# Patient Record
Sex: Female | Born: 1945 | Race: White | Hispanic: No | Smoking: Current every day smoker
Health system: Southern US, Community
[De-identification: ages and names within clinical notes are randomized; demographics above are authoritative.]

## PROBLEM LIST (undated history)

## (undated) ENCOUNTER — Emergency Department: Payer: Medicare Other

## (undated) DIAGNOSIS — C801 Malignant (primary) neoplasm, unspecified: Secondary | ICD-10-CM

## (undated) DIAGNOSIS — G14 Postpolio syndrome: Secondary | ICD-10-CM

## (undated) DIAGNOSIS — K219 Gastro-esophageal reflux disease without esophagitis: Secondary | ICD-10-CM

## (undated) DIAGNOSIS — J449 Chronic obstructive pulmonary disease, unspecified: Secondary | ICD-10-CM

## (undated) DIAGNOSIS — B91 Sequelae of poliomyelitis: Secondary | ICD-10-CM

## (undated) DIAGNOSIS — I10 Essential (primary) hypertension: Secondary | ICD-10-CM

## (undated) DIAGNOSIS — F32A Depression, unspecified: Secondary | ICD-10-CM

## (undated) DIAGNOSIS — F329 Major depressive disorder, single episode, unspecified: Secondary | ICD-10-CM

## (undated) DIAGNOSIS — E119 Type 2 diabetes mellitus without complications: Secondary | ICD-10-CM

## (undated) DIAGNOSIS — J439 Emphysema, unspecified: Secondary | ICD-10-CM

## (undated) DIAGNOSIS — M5030 Other cervical disc degeneration, unspecified cervical region: Secondary | ICD-10-CM

## (undated) DIAGNOSIS — A809 Acute poliomyelitis, unspecified: Secondary | ICD-10-CM

## (undated) HISTORY — PX: BLADDER SURGERY: SHX569

## (undated) HISTORY — DX: Gastro-esophageal reflux disease without esophagitis: K21.9

## (undated) HISTORY — DX: Type 2 diabetes mellitus without complications: E11.9

## (undated) HISTORY — DX: Chronic obstructive pulmonary disease, unspecified: J44.9

## (undated) HISTORY — PX: ABDOMINAL HYSTERECTOMY: SHX81

## (undated) HISTORY — DX: Other cervical disc degeneration, unspecified cervical region: M50.30

## (undated) HISTORY — DX: Depression, unspecified: F32.A

## (undated) HISTORY — DX: Postpolio syndrome: G14

## (undated) HISTORY — PX: ESOPHAGEAL DILATION: SHX303

## (undated) HISTORY — PX: BREAST LUMPECTOMY: SHX2

## (undated) HISTORY — DX: Acute poliomyelitis, unspecified: A80.9

## (undated) HISTORY — DX: Essential (primary) hypertension: I10

## (undated) HISTORY — DX: Major depressive disorder, single episode, unspecified: F32.9

## (undated) HISTORY — PX: CHOLECYSTECTOMY: SHX55

---

## 2002-06-17 HISTORY — PX: CARPAL TUNNEL RELEASE: SHX101

## 2005-09-24 ENCOUNTER — Ambulatory Visit: Payer: Self-pay | Admitting: Unknown Physician Specialty

## 2007-10-09 ENCOUNTER — Ambulatory Visit: Payer: Self-pay | Admitting: Surgery

## 2008-01-06 ENCOUNTER — Ambulatory Visit: Payer: Self-pay | Admitting: Unknown Physician Specialty

## 2008-01-22 ENCOUNTER — Ambulatory Visit: Payer: Self-pay | Admitting: Unknown Physician Specialty

## 2008-02-03 ENCOUNTER — Other Ambulatory Visit: Payer: Self-pay

## 2008-02-03 ENCOUNTER — Ambulatory Visit: Payer: Self-pay | Admitting: Surgery

## 2008-02-09 ENCOUNTER — Ambulatory Visit: Payer: Self-pay | Admitting: Surgery

## 2008-09-15 HISTORY — PX: RECTAL PROLAPSE REPAIR: SHX759

## 2008-10-06 ENCOUNTER — Ambulatory Visit: Payer: Self-pay | Admitting: Surgery

## 2008-10-13 ENCOUNTER — Inpatient Hospital Stay: Payer: Self-pay | Admitting: Surgery

## 2009-07-10 ENCOUNTER — Ambulatory Visit: Payer: Self-pay | Admitting: Family Medicine

## 2009-09-28 ENCOUNTER — Ambulatory Visit: Payer: Self-pay | Admitting: Otolaryngology

## 2010-04-10 ENCOUNTER — Ambulatory Visit: Payer: Self-pay | Admitting: Surgery

## 2010-05-02 ENCOUNTER — Ambulatory Visit: Payer: Self-pay | Admitting: Unknown Physician Specialty

## 2010-05-09 ENCOUNTER — Ambulatory Visit: Payer: Self-pay | Admitting: Surgery

## 2010-05-16 ENCOUNTER — Ambulatory Visit: Payer: Self-pay | Admitting: Surgery

## 2010-05-29 LAB — PATHOLOGY REPORT

## 2010-06-05 ENCOUNTER — Ambulatory Visit: Payer: Self-pay | Admitting: Internal Medicine

## 2010-06-12 ENCOUNTER — Ambulatory Visit: Payer: Self-pay | Admitting: Internal Medicine

## 2010-06-13 ENCOUNTER — Ambulatory Visit: Payer: Self-pay | Admitting: Surgery

## 2010-06-17 ENCOUNTER — Ambulatory Visit: Payer: Self-pay | Admitting: Internal Medicine

## 2010-07-18 ENCOUNTER — Ambulatory Visit: Payer: Self-pay | Admitting: Internal Medicine

## 2010-08-16 ENCOUNTER — Ambulatory Visit: Payer: Self-pay | Admitting: Internal Medicine

## 2010-09-16 ENCOUNTER — Ambulatory Visit: Payer: Self-pay | Admitting: Internal Medicine

## 2010-10-02 ENCOUNTER — Ambulatory Visit: Payer: Self-pay | Admitting: Internal Medicine

## 2010-10-16 ENCOUNTER — Ambulatory Visit: Payer: Self-pay | Admitting: Internal Medicine

## 2010-11-16 ENCOUNTER — Ambulatory Visit: Payer: Self-pay | Admitting: Internal Medicine

## 2010-12-16 ENCOUNTER — Ambulatory Visit: Payer: Self-pay | Admitting: Internal Medicine

## 2011-01-16 ENCOUNTER — Ambulatory Visit: Payer: Self-pay | Admitting: Internal Medicine

## 2011-02-22 ENCOUNTER — Ambulatory Visit: Payer: Self-pay | Admitting: Internal Medicine

## 2011-03-18 ENCOUNTER — Ambulatory Visit: Payer: Self-pay | Admitting: Internal Medicine

## 2011-05-06 ENCOUNTER — Ambulatory Visit: Payer: Self-pay | Admitting: Internal Medicine

## 2011-05-18 ENCOUNTER — Ambulatory Visit: Payer: Self-pay | Admitting: Internal Medicine

## 2011-06-18 ENCOUNTER — Ambulatory Visit: Payer: Self-pay | Admitting: Internal Medicine

## 2011-07-08 LAB — CANCER CENTER HEMATOCRIT: HCT: 46.2 % (ref 35.0–47.0)

## 2011-07-19 ENCOUNTER — Ambulatory Visit: Payer: Self-pay | Admitting: Internal Medicine

## 2011-07-29 LAB — CANCER CENTER HEMATOCRIT: HCT: 47 % (ref 35.0–47.0)

## 2011-08-16 ENCOUNTER — Ambulatory Visit: Payer: Self-pay | Admitting: Internal Medicine

## 2011-08-19 ENCOUNTER — Ambulatory Visit: Payer: Self-pay | Admitting: Internal Medicine

## 2011-08-19 LAB — CBC CANCER CENTER
Basophil #: 0 x10 3/mm (ref 0.0–0.1)
Basophil %: 0.3 %
Eosinophil #: 0.1 x10 3/mm (ref 0.0–0.7)
HCT: 44.9 % (ref 35.0–47.0)
HGB: 15.4 g/dL (ref 12.0–16.0)
Lymphocyte %: 20.9 %
MCH: 32.9 pg (ref 26.0–34.0)
Monocyte #: 0.4 x10 3/mm (ref 0.0–0.7)
Monocyte %: 5.9 %
Neutrophil %: 71.1 %
Platelet: 262 x10 3/mm (ref 150–440)
RBC: 4.68 10*6/uL (ref 3.80–5.20)
RDW: 13.8 % (ref 11.5–14.5)
WBC: 6.1 x10 3/mm (ref 3.6–11.0)

## 2011-08-19 LAB — CREATININE, SERUM
EGFR (African American): >60
EGFR (Non-African Amer.): >60

## 2011-08-19 LAB — LACTATE DEHYDROGENASE: LDH: 210 U/L (ref 84–246)

## 2011-08-19 LAB — HEPATIC FUNCTION PANEL A (ARMC)
Albumin: 3.8 g/dL (ref 3.4–5.0)
Bilirubin,Total: 0.4 mg/dL (ref 0.2–1.0)
SGOT(AST): 93 U/L — ABNORMAL HIGH (ref 15–37)
SGPT (ALT): 189 U/L — ABNORMAL HIGH
Total Protein: 7.9 g/dL (ref 6.4–8.2)

## 2011-09-16 ENCOUNTER — Ambulatory Visit: Payer: Self-pay | Admitting: Internal Medicine

## 2011-10-16 ENCOUNTER — Ambulatory Visit: Payer: Self-pay | Admitting: Internal Medicine

## 2011-11-05 ENCOUNTER — Ambulatory Visit: Payer: Self-pay | Admitting: Unknown Physician Specialty

## 2011-11-06 LAB — PATHOLOGY REPORT

## 2011-11-16 ENCOUNTER — Ambulatory Visit: Payer: Self-pay | Admitting: Internal Medicine

## 2011-12-04 ENCOUNTER — Emergency Department: Payer: Self-pay | Admitting: Emergency Medicine

## 2011-12-04 LAB — COMPREHENSIVE METABOLIC PANEL
Albumin: 3.5 g/dL (ref 3.4–5.0)
Alkaline Phosphatase: 124 U/L (ref 50–136)
Anion Gap: 7 (ref 7–16)
Bilirubin,Total: 0.3 mg/dL (ref 0.2–1.0)
Calcium, Total: 8.8 mg/dL (ref 8.5–10.1)
Co2: 29 mmol/L (ref 21–32)
Creatinine: 0.44 mg/dL — ABNORMAL LOW (ref 0.60–1.30)
EGFR (African American): >60
Potassium: 3.6 mmol/L (ref 3.5–5.1)
SGOT(AST): 49 U/L — ABNORMAL HIGH (ref 15–37)
SGPT (ALT): 81 U/L — ABNORMAL HIGH

## 2011-12-04 LAB — CBC
HCT: 43.2 % (ref 35.0–47.0)
HGB: 14.1 g/dL (ref 12.0–16.0)
MCH: 30.3 pg (ref 26.0–34.0)
MCHC: 32.6 g/dL (ref 32.0–36.0)
MCV: 93 fL (ref 80–100)
RBC: 4.66 10*6/uL (ref 3.80–5.20)
WBC: 6.3 10*3/uL (ref 3.6–11.0)

## 2011-12-11 LAB — CANCER CENTER HEMATOCRIT: HCT: 43 % (ref 35.0–47.0)

## 2011-12-12 ENCOUNTER — Ambulatory Visit: Payer: Self-pay | Admitting: Otolaryngology

## 2011-12-12 LAB — CREATININE, SERUM
EGFR (African American): >60
EGFR (Non-African Amer.): >60

## 2011-12-16 ENCOUNTER — Ambulatory Visit: Payer: Self-pay | Admitting: Internal Medicine

## 2012-02-05 ENCOUNTER — Ambulatory Visit: Payer: Self-pay | Admitting: Internal Medicine

## 2012-02-05 LAB — CBC CANCER CENTER
Basophil #: 0 x10 3/mm (ref 0.0–0.1)
Basophil %: 0.6 %
Eosinophil #: 0.1 x10 3/mm (ref 0.0–0.7)
HCT: 41.7 % (ref 35.0–47.0)
Lymphocyte #: 1.7 x10 3/mm (ref 1.0–3.6)
MCHC: 33 g/dL (ref 32.0–36.0)
MCV: 92 fL (ref 80–100)
Monocyte %: 7.2 %
Neutrophil #: 4.9 x10 3/mm (ref 1.4–6.5)
RBC: 4.55 10*6/uL (ref 3.80–5.20)
RDW: 15.1 % — ABNORMAL HIGH (ref 11.5–14.5)
WBC: 7.3 x10 3/mm (ref 3.6–11.0)

## 2012-02-05 LAB — CREATININE, SERUM
Creatinine: 0.71 mg/dL (ref 0.60–1.30)
EGFR (African American): >60
EGFR (Non-African Amer.): >60

## 2012-02-05 LAB — HEPATIC FUNCTION PANEL A (ARMC)
Albumin: 3.5 g/dL (ref 3.4–5.0)
Alkaline Phosphatase: 128 U/L (ref 50–136)
Bilirubin,Total: 0.4 mg/dL (ref 0.2–1.0)
SGOT(AST): 47 U/L — ABNORMAL HIGH (ref 15–37)
SGPT (ALT): 86 U/L — ABNORMAL HIGH (ref 12–78)

## 2012-02-16 ENCOUNTER — Ambulatory Visit: Payer: Self-pay | Admitting: Internal Medicine

## 2012-04-15 ENCOUNTER — Ambulatory Visit: Payer: Self-pay | Admitting: Internal Medicine

## 2012-04-15 LAB — CANCER CENTER HEMATOCRIT: HCT: 43.7 % (ref 35.0–47.0)

## 2012-04-17 ENCOUNTER — Ambulatory Visit: Payer: Self-pay | Admitting: Internal Medicine

## 2012-06-24 ENCOUNTER — Ambulatory Visit: Payer: Self-pay | Admitting: Internal Medicine

## 2012-06-24 LAB — CREATININE, SERUM
Creatinine: 0.66 mg/dL (ref 0.60–1.30)
EGFR (Non-African Amer.): >60

## 2012-06-24 LAB — CBC CANCER CENTER
Basophil #: 0.1 x10 3/mm (ref 0.0–0.1)
Basophil %: 1.2 %
Eosinophil #: 0.1 x10 3/mm (ref 0.0–0.7)
HCT: 42.8 % (ref 35.0–47.0)
Lymphocyte %: 24.3 %
MCHC: 34.1 g/dL (ref 32.0–36.0)
Monocyte #: 0.6 x10 3/mm (ref 0.2–0.9)
Neutrophil #: 5.2 x10 3/mm (ref 1.4–6.5)
RDW: 14.6 % — ABNORMAL HIGH (ref 11.5–14.5)

## 2012-06-24 LAB — HEPATIC FUNCTION PANEL A (ARMC)
Alkaline Phosphatase: 126 U/L (ref 50–136)
Bilirubin,Total: 0.4 mg/dL (ref 0.2–1.0)
SGOT(AST): 47 U/L — ABNORMAL HIGH (ref 15–37)
SGPT (ALT): 81 U/L — ABNORMAL HIGH (ref 12–78)

## 2012-06-24 LAB — LACTATE DEHYDROGENASE: LDH: 183 U/L (ref 81–246)

## 2012-07-18 ENCOUNTER — Ambulatory Visit: Payer: Self-pay | Admitting: Internal Medicine

## 2012-11-11 ENCOUNTER — Ambulatory Visit: Payer: Self-pay | Admitting: Internal Medicine

## 2012-11-11 LAB — CBC CANCER CENTER
Basophil #: 0.1 x10 3/mm (ref 0.0–0.1)
Basophil %: 0.7 %
Eosinophil #: 0.1 x10 3/mm (ref 0.0–0.7)
HCT: 42.7 % (ref 35.0–47.0)
HGB: 14.2 g/dL (ref 12.0–16.0)
Lymphocyte %: 25.2 %
MCHC: 33.3 g/dL (ref 32.0–36.0)
MCV: 87 fL (ref 80–100)
Neutrophil #: 4.8 x10 3/mm (ref 1.4–6.5)
Platelet: 250 x10 3/mm (ref 150–440)
RBC: 4.92 10*6/uL (ref 3.80–5.20)
RDW: 15.5 % — ABNORMAL HIGH (ref 11.5–14.5)
WBC: 7.2 x10 3/mm (ref 3.6–11.0)

## 2012-11-11 LAB — LACTATE DEHYDROGENASE: LDH: 165 U/L (ref 81–246)

## 2012-11-11 LAB — CREATININE, SERUM: EGFR (Non-African Amer.): >60

## 2012-11-11 LAB — HEPATIC FUNCTION PANEL A (ARMC)
Albumin: 3.8 g/dL (ref 3.4–5.0)
Bilirubin, Direct: 0.1 mg/dL (ref 0.00–0.20)
Bilirubin,Total: 0.4 mg/dL (ref 0.2–1.0)
SGOT(AST): 27 U/L (ref 15–37)

## 2012-11-15 ENCOUNTER — Ambulatory Visit: Payer: Self-pay | Admitting: Internal Medicine

## 2012-12-30 ENCOUNTER — Ambulatory Visit: Payer: Self-pay | Admitting: Internal Medicine

## 2013-01-15 ENCOUNTER — Ambulatory Visit: Payer: Self-pay | Admitting: Internal Medicine

## 2013-02-15 ENCOUNTER — Ambulatory Visit: Payer: Self-pay | Admitting: Internal Medicine

## 2013-06-30 ENCOUNTER — Ambulatory Visit: Payer: Self-pay | Admitting: Internal Medicine

## 2013-07-02 ENCOUNTER — Ambulatory Visit: Payer: Self-pay | Admitting: Internal Medicine

## 2013-07-02 LAB — CBC CANCER CENTER
BASOS PCT: 0.8 %
Basophil #: 0.1 x10 3/mm (ref 0.0–0.1)
EOS PCT: 2.6 %
Eosinophil #: 0.2 x10 3/mm (ref 0.0–0.7)
HCT: 42.6 % (ref 35.0–47.0)
HGB: 13.9 g/dL (ref 12.0–16.0)
LYMPHS ABS: 1.5 x10 3/mm (ref 1.0–3.6)
LYMPHS PCT: 23.3 %
MCH: 29 pg (ref 26.0–34.0)
MCHC: 32.6 g/dL (ref 32.0–36.0)
MCV: 89 fL (ref 80–100)
MONO ABS: 0.5 x10 3/mm (ref 0.2–0.9)
Monocyte %: 7.9 %
Neutrophil #: 4.1 x10 3/mm (ref 1.4–6.5)
Neutrophil %: 65.4 %
PLATELETS: 220 x10 3/mm (ref 150–440)
RBC: 4.78 10*6/uL (ref 3.80–5.20)
RDW: 15.8 % — AB (ref 11.5–14.5)
WBC: 6.3 x10 3/mm (ref 3.6–11.0)

## 2013-07-02 LAB — HEPATIC FUNCTION PANEL A (ARMC)
ALBUMIN: 3.5 g/dL (ref 3.4–5.0)
Alkaline Phosphatase: 95 U/L
Bilirubin, Direct: 0.1 mg/dL (ref 0.00–0.20)
Bilirubin,Total: 0.3 mg/dL (ref 0.2–1.0)
SGOT(AST): 21 U/L (ref 15–37)
SGPT (ALT): 33 U/L (ref 12–78)
Total Protein: 7.2 g/dL (ref 6.4–8.2)

## 2013-07-02 LAB — CREATININE, SERUM
Creatinine: 0.65 mg/dL (ref 0.60–1.30)
EGFR (African American): >60

## 2013-07-02 LAB — LACTATE DEHYDROGENASE: LDH: 161 U/L (ref 81–246)

## 2013-07-18 ENCOUNTER — Ambulatory Visit: Payer: Self-pay | Admitting: Internal Medicine

## 2013-12-06 DIAGNOSIS — G14 Postpolio syndrome: Secondary | ICD-10-CM | POA: Insufficient documentation

## 2013-12-06 DIAGNOSIS — Z8572 Personal history of non-Hodgkin lymphomas: Secondary | ICD-10-CM | POA: Insufficient documentation

## 2013-12-06 DIAGNOSIS — Z8579 Personal history of other malignant neoplasms of lymphoid, hematopoietic and related tissues: Secondary | ICD-10-CM | POA: Insufficient documentation

## 2013-12-06 DIAGNOSIS — E7800 Pure hypercholesterolemia, unspecified: Secondary | ICD-10-CM | POA: Insufficient documentation

## 2013-12-06 DIAGNOSIS — E119 Type 2 diabetes mellitus without complications: Secondary | ICD-10-CM | POA: Insufficient documentation

## 2013-12-06 DIAGNOSIS — K219 Gastro-esophageal reflux disease without esophagitis: Secondary | ICD-10-CM | POA: Insufficient documentation

## 2013-12-06 DIAGNOSIS — J449 Chronic obstructive pulmonary disease, unspecified: Secondary | ICD-10-CM | POA: Insufficient documentation

## 2013-12-06 DIAGNOSIS — I10 Essential (primary) hypertension: Secondary | ICD-10-CM | POA: Insufficient documentation

## 2013-12-10 ENCOUNTER — Ambulatory Visit: Payer: Self-pay | Admitting: Internal Medicine

## 2013-12-10 LAB — HEMATOCRIT: HCT: 43.5 % (ref 35.0–47.0)

## 2013-12-15 ENCOUNTER — Ambulatory Visit: Payer: Self-pay | Admitting: Internal Medicine

## 2014-01-15 ENCOUNTER — Ambulatory Visit: Payer: Self-pay | Admitting: Internal Medicine

## 2014-01-23 ENCOUNTER — Inpatient Hospital Stay: Payer: Self-pay | Admitting: Internal Medicine

## 2014-01-23 LAB — DRUG SCREEN, URINE
Amphetamines, Ur Screen: NEGATIVE (ref ?–1000)
Barbiturates, Ur Screen: NEGATIVE (ref ?–200)
Benzodiazepine, Ur Scrn: POSITIVE (ref ?–200)
CANNABINOID 50 NG, UR ~~LOC~~: NEGATIVE (ref ?–50)
Cocaine Metabolite,Ur ~~LOC~~: NEGATIVE (ref ?–300)
MDMA (ECSTASY) UR SCREEN: NEGATIVE (ref ?–500)
Methadone, Ur Screen: NEGATIVE (ref ?–300)
OPIATE, UR SCREEN: NEGATIVE (ref ?–300)
PHENCYCLIDINE (PCP) UR S: NEGATIVE (ref ?–25)
Tricyclic, Ur Screen: NEGATIVE (ref ?–1000)

## 2014-01-23 LAB — URINALYSIS, COMPLETE
BILIRUBIN, UR: NEGATIVE
Bacteria: NONE SEEN
Blood: NEGATIVE
Glucose,UR: NEGATIVE mg/dL (ref 0–75)
Leukocyte Esterase: NEGATIVE
NITRITE: NEGATIVE
Ph: 5 (ref 4.5–8.0)
Protein: 100
RBC,UR: 2 /HPF (ref 0–5)
Specific Gravity: 1.019 (ref 1.003–1.030)
Squamous Epithelial: NONE SEEN
WBC UR: 3 /HPF (ref 0–5)

## 2014-01-23 LAB — ACETAMINOPHEN LEVEL

## 2014-01-23 LAB — COMPREHENSIVE METABOLIC PANEL
ALBUMIN: 3.9 g/dL (ref 3.4–5.0)
ANION GAP: 14 (ref 7–16)
AST: 47 U/L — AB (ref 15–37)
Alkaline Phosphatase: 95 U/L
BUN: 11 mg/dL (ref 7–18)
Bilirubin,Total: 0.5 mg/dL (ref 0.2–1.0)
CALCIUM: 8.9 mg/dL (ref 8.5–10.1)
CREATININE: 0.91 mg/dL (ref 0.60–1.30)
Chloride: 104 mmol/L (ref 98–107)
Co2: 21 mmol/L (ref 21–32)
EGFR (African American): >60
EGFR (Non-African Amer.): >60
GLUCOSE: 188 mg/dL — AB (ref 65–99)
Osmolality: 282 (ref 275–301)
POTASSIUM: 3.7 mmol/L (ref 3.5–5.1)
SGPT (ALT): 50 U/L
SODIUM: 139 mmol/L (ref 136–145)
Total Protein: 8 g/dL (ref 6.4–8.2)

## 2014-01-23 LAB — CBC
HCT: 47 % (ref 35.0–47.0)
HGB: 15.4 g/dL (ref 12.0–16.0)
MCH: 30.9 pg (ref 26.0–34.0)
MCHC: 32.8 g/dL (ref 32.0–36.0)
MCV: 94 fL (ref 80–100)
PLATELETS: 263 10*3/uL (ref 150–440)
RBC: 4.99 10*6/uL (ref 3.80–5.20)
RDW: 15.8 % — ABNORMAL HIGH (ref 11.5–14.5)
WBC: 7.8 10*3/uL (ref 3.6–11.0)

## 2014-01-23 LAB — ETHANOL: Ethanol: <3 mg/dL

## 2014-01-23 LAB — SALICYLATE LEVEL: SALICYLATES, SERUM: 4.5 mg/dL — AB

## 2014-01-23 LAB — LIPASE, BLOOD: Lipase: 76 U/L (ref 73–393)

## 2014-01-24 ENCOUNTER — Ambulatory Visit: Payer: Self-pay | Admitting: Neurology

## 2014-01-24 LAB — BASIC METABOLIC PANEL
Anion Gap: 8 (ref 7–16)
BUN: 13 mg/dL (ref 7–18)
CREATININE: 0.64 mg/dL (ref 0.60–1.30)
Calcium, Total: 8.3 mg/dL — ABNORMAL LOW (ref 8.5–10.1)
Chloride: 109 mmol/L — ABNORMAL HIGH (ref 98–107)
Co2: 27 mmol/L (ref 21–32)
EGFR (African American): >60
EGFR (Non-African Amer.): >60
Glucose: 79 mg/dL (ref 65–99)
Osmolality: 286 (ref 275–301)
Potassium: 3.4 mmol/L — ABNORMAL LOW (ref 3.5–5.1)
Sodium: 144 mmol/L (ref 136–145)

## 2014-01-24 LAB — HEMOGLOBIN A1C: HEMOGLOBIN A1C: 6.1 % (ref 4.2–6.3)

## 2014-01-24 LAB — MAGNESIUM: Magnesium: 2 mg/dL

## 2014-01-24 LAB — CBC WITH DIFFERENTIAL/PLATELET
BASOS ABS: 0 10*3/uL (ref 0.0–0.1)
Basophil %: 0.5 %
EOS ABS: 0.1 10*3/uL (ref 0.0–0.7)
Eosinophil %: 1.8 %
HCT: 41.2 % (ref 35.0–47.0)
HGB: 13.4 g/dL (ref 12.0–16.0)
LYMPHS PCT: 27.3 %
Lymphocyte #: 1.8 10*3/uL (ref 1.0–3.6)
MCH: 30.4 pg (ref 26.0–34.0)
MCHC: 32.6 g/dL (ref 32.0–36.0)
MCV: 93 fL (ref 80–100)
MONOS PCT: 8 %
Monocyte #: 0.5 x10 3/mm (ref 0.2–0.9)
Neutrophil #: 4.1 10*3/uL (ref 1.4–6.5)
Neutrophil %: 62.4 %
PLATELETS: 203 10*3/uL (ref 150–440)
RBC: 4.41 10*6/uL (ref 3.80–5.20)
RDW: 15.6 % — AB (ref 11.5–14.5)
WBC: 6.6 10*3/uL (ref 3.6–11.0)

## 2014-01-24 LAB — TSH: Thyroid Stimulating Horm: 4.13 u[IU]/mL

## 2014-02-05 ENCOUNTER — Emergency Department: Payer: Self-pay | Admitting: Emergency Medicine

## 2014-02-05 LAB — URINALYSIS, COMPLETE
BILIRUBIN, UR: NEGATIVE
BLOOD: NEGATIVE
Bacteria: NONE SEEN
GLUCOSE, UR: NEGATIVE mg/dL (ref 0–75)
Ketone: NEGATIVE
Leukocyte Esterase: NEGATIVE
Nitrite: NEGATIVE
PH: 6 (ref 4.5–8.0)
Protein: NEGATIVE
RBC,UR: 1 /HPF (ref 0–5)
Specific Gravity: 1.005 (ref 1.003–1.030)
WBC UR: <1 /HPF (ref 0–5)

## 2014-02-05 LAB — CBC
HCT: 45 % (ref 35.0–47.0)
HGB: 14.6 g/dL (ref 12.0–16.0)
MCH: 30.3 pg (ref 26.0–34.0)
MCHC: 32.5 g/dL (ref 32.0–36.0)
MCV: 93 fL (ref 80–100)
PLATELETS: 240 10*3/uL (ref 150–440)
RBC: 4.83 10*6/uL (ref 3.80–5.20)
RDW: 15.9 % — ABNORMAL HIGH (ref 11.5–14.5)
WBC: 6.5 10*3/uL (ref 3.6–11.0)

## 2014-02-05 LAB — BASIC METABOLIC PANEL
Anion Gap: 5 — ABNORMAL LOW (ref 7–16)
BUN: 10 mg/dL (ref 7–18)
CHLORIDE: 105 mmol/L (ref 98–107)
Calcium, Total: 9.6 mg/dL (ref 8.5–10.1)
Co2: 30 mmol/L (ref 21–32)
Creatinine: 0.72 mg/dL (ref 0.60–1.30)
EGFR (African American): >60
Glucose: 134 mg/dL — ABNORMAL HIGH (ref 65–99)
Osmolality: 280 (ref 275–301)
Potassium: 3.5 mmol/L (ref 3.5–5.1)
SODIUM: 140 mmol/L (ref 136–145)

## 2014-02-07 DIAGNOSIS — G479 Sleep disorder, unspecified: Secondary | ICD-10-CM | POA: Insufficient documentation

## 2014-02-07 DIAGNOSIS — R569 Unspecified convulsions: Secondary | ICD-10-CM | POA: Insufficient documentation

## 2014-02-13 ENCOUNTER — Emergency Department: Payer: Self-pay | Admitting: Emergency Medicine

## 2014-02-13 LAB — BASIC METABOLIC PANEL
ANION GAP: 8 (ref 7–16)
BUN: 7 mg/dL (ref 7–18)
CALCIUM: 8.3 mg/dL — AB (ref 8.5–10.1)
Chloride: 94 mmol/L — ABNORMAL LOW (ref 98–107)
Co2: 27 mmol/L (ref 21–32)
Creatinine: 0.63 mg/dL (ref 0.60–1.30)
EGFR (African American): >60
EGFR (Non-African Amer.): >60
Glucose: 121 mg/dL — ABNORMAL HIGH (ref 65–99)
Osmolality: 258 (ref 275–301)
POTASSIUM: 3.9 mmol/L (ref 3.5–5.1)
Sodium: 129 mmol/L — ABNORMAL LOW (ref 136–145)

## 2014-02-13 LAB — CBC WITH DIFFERENTIAL/PLATELET
BASOS PCT: 0.6 %
Basophil #: 0 10*3/uL (ref 0.0–0.1)
EOS ABS: 0.1 10*3/uL (ref 0.0–0.7)
Eosinophil %: 0.9 %
HCT: 41.9 % (ref 35.0–47.0)
HGB: 13.9 g/dL (ref 12.0–16.0)
LYMPHS PCT: 19 %
Lymphocyte #: 1.2 10*3/uL (ref 1.0–3.6)
MCH: 30.5 pg (ref 26.0–34.0)
MCHC: 33.1 g/dL (ref 32.0–36.0)
MCV: 92 fL (ref 80–100)
MONO ABS: 0.4 x10 3/mm (ref 0.2–0.9)
MONOS PCT: 6.7 %
Neutrophil #: 4.7 10*3/uL (ref 1.4–6.5)
Neutrophil %: 72.8 %
Platelet: 207 10*3/uL (ref 150–440)
RBC: 4.54 10*6/uL (ref 3.80–5.20)
RDW: 15.7 % — ABNORMAL HIGH (ref 11.5–14.5)
WBC: 6.4 10*3/uL (ref 3.6–11.0)

## 2014-02-13 LAB — URINALYSIS, COMPLETE
Bilirubin,UR: NEGATIVE
Blood: NEGATIVE
Glucose,UR: NEGATIVE mg/dL (ref 0–75)
Ketone: NEGATIVE
Leukocyte Esterase: NEGATIVE
Nitrite: NEGATIVE
Ph: 8 (ref 4.5–8.0)
Protein: NEGATIVE
SPECIFIC GRAVITY: 1.005 (ref 1.003–1.030)
Squamous Epithelial: NONE SEEN
WBC UR: 1 /HPF (ref 0–5)

## 2014-02-13 LAB — VALPROIC ACID LEVEL: Valproic Acid: 63 ug/mL

## 2014-02-13 LAB — TROPONIN I

## 2014-02-15 ENCOUNTER — Ambulatory Visit: Payer: Self-pay | Admitting: Internal Medicine

## 2014-02-15 DIAGNOSIS — R262 Difficulty in walking, not elsewhere classified: Secondary | ICD-10-CM | POA: Insufficient documentation

## 2014-02-15 DIAGNOSIS — R5383 Other fatigue: Secondary | ICD-10-CM | POA: Insufficient documentation

## 2014-06-29 ENCOUNTER — Ambulatory Visit: Payer: Self-pay | Admitting: Internal Medicine

## 2014-06-29 LAB — CBC CANCER CENTER
Basophil #: 0 x10 3/mm (ref 0.0–0.1)
Basophil %: 0.6 %
Eosinophil #: 0.1 x10 3/mm (ref 0.0–0.7)
Eosinophil %: 2.7 %
HCT: 41.6 % (ref 35.0–47.0)
HGB: 14 g/dL (ref 12.0–16.0)
LYMPHS PCT: 20.1 %
Lymphocyte #: 1 x10 3/mm (ref 1.0–3.6)
MCH: 32 pg (ref 26.0–34.0)
MCHC: 33.6 g/dL (ref 32.0–36.0)
MCV: 95 fL (ref 80–100)
Monocyte #: 0.4 x10 3/mm (ref 0.2–0.9)
Monocyte %: 7.4 %
Neutrophil #: 3.5 x10 3/mm (ref 1.4–6.5)
Neutrophil %: 69.2 %
Platelet: 219 x10 3/mm (ref 150–440)
RBC: 4.37 10*6/uL (ref 3.80–5.20)
RDW: 15.9 % — AB (ref 11.5–14.5)
WBC: 5.1 x10 3/mm (ref 3.6–11.0)

## 2014-06-29 LAB — HEPATIC FUNCTION PANEL A (ARMC)
ALBUMIN: 3.4 g/dL (ref 3.4–5.0)
Alkaline Phosphatase: 99 U/L
BILIRUBIN DIRECT: 0.1 mg/dL (ref 0.0–0.2)
Bilirubin,Total: 0.3 mg/dL (ref 0.2–1.0)
SGOT(AST): 31 U/L (ref 15–37)
SGPT (ALT): 69 U/L — ABNORMAL HIGH
Total Protein: 7.3 g/dL (ref 6.4–8.2)

## 2014-06-29 LAB — CREATININE, SERUM
Creatinine: 0.59 mg/dL — ABNORMAL LOW (ref 0.60–1.30)
EGFR (Non-African Amer.): >60

## 2014-06-29 LAB — LACTATE DEHYDROGENASE: LDH: 197 U/L (ref 81–246)

## 2014-07-04 DIAGNOSIS — R251 Tremor, unspecified: Secondary | ICD-10-CM | POA: Insufficient documentation

## 2014-07-18 ENCOUNTER — Ambulatory Visit: Payer: Self-pay | Admitting: Internal Medicine

## 2014-10-08 NOTE — Consult Note (Signed)
PATIENT NAME:  [name], [name] MR#:  [id number] DATE OF BIRTH:  09-22-1945  DATE OF CONSULTATION:  01/24/2014  REFERRING PHYSICIAN:  Ocie Cornfield. Ouida Sills, MD  CONSULTING PHYSICIAN:  Leatta Alewine R. Ma Hillock, MD  REASON FOR CONSULTATION: Lymphoma, seizures, falls.   HISTORY OF PRESENT ILLNESS: The patient is a 69 year old female patient with a past medical history significant for polio, hypertension, diabetes, emphysema, chronic smoking, Hodgkin disease (stage II classical Hodgkin lymphoma on left supraclavicular lymph node biopsy diagnosed November 2011, status post 4 cycles of ABVD chemotherapy completed April 2012 and then received involved field radiation June 2012, has been in remission since then), who has been currently admitted with about 2 episodes of seizure-like activity. The patient currently is tired and resting in bed. Her daughter is present at bedside. Overall states that eating is steady, no unintentional weight loss. She denies any fevers or night sweats. Denies feeling any lymph node masses on self-examination. She has chronic dyspnea on exertion, and cough. No hemoptysis or chest pain. MRI of the brain has been scheduled and is pending at this time.   PAST MEDICAL HISTORY: As in HPI.   FAMILY HISTORY: Remarkable for diabetes and hypertension. Denies malignancy.   SOCIAL HISTORY: Chronic smoker, 1 to 2 packs per day x 30 years. Denies alcohol or recreational drug usage.   ALLERGIES: INCLUDE PREDNISONE AND SPORANOX.   PAST SURGICAL HISTORY: Leg surgery for polio, breast lumpectomy, bladder surgery, cholecystectomy, hysterectomy, carpal tunnel release.   HOME MEDICATIONS: Singulair 10 mg daily; Paxil 20 mg daily; omeprazole 20 mg daily; losartan 50 mg daily; zolpidem 5 mg at bedtime; vitamin E 400 units daily; vitamin D3, 2000 units daily; B complex once daily; Myrbetriq 20 mg daily; losartan 50 mg daily; fish oil 1000 mg daily; Dulcolax 100 mg 4 caplets every other day as needed;  cyclobenzaprine 10 mg at bedtime; alprazolam 1 mg at bedtime; aspirin 81 mg daily.   REVIEW OF SYSTEMS: CONSTITUTIONAL: Generalized fatigue, currently resting after treatment for recent seizures. No fever or chills. No night sweats.  HEENT: Denies any headaches or dizziness at rest. No epistaxis, ear, or jaw pain.  CARDIAC: No angina, palpitation, orthopnea, or pain.  LUNGS: As in HPI above. No hemoptysis.  GASTROINTESTINAL: No nausea or vomiting. No diarrhea or blood in stools.  GENITOURINARY: No dysuria or hematuria.  MUSCULOSKELETAL: Has chronic arthritis, no new bone pains.  NEUROLOGIC: As in HPI.  ENDOCRINE: No polyuria or polydipsia. Appetite is steady.   PHYSICAL EXAMINATION:  GENERAL: The patient is resting in bed, otherwise awake and oriented and converses appropriately. No acute distress.  VITAL SIGNS: Temperature 98.3, heart rate 73, respirations 17, blood pressure 132/73, oxygen saturation 74% on 1 L.  HEENT: Normocephalic, atraumatic. Extraocular movements intact. Sclerae anicteric.  NECK: Negative for lymphadenopathy. CARDIOVASCULAR: S1, S2, regular rate and rhythm.  LUNGS: Bilaterally diminished breath sounds overall. No crepitations or rhonchi noted.  ABDOMEN: Soft, nontender. No hepatosplenomegaly clinically.  EXTREMITIES: Show no right leg edema or cyanosis.  NEUROLOGIC: Limited examination. Cranial nerves seem intact. Moves all extremities spontaneously.  SKIN: No new generalized rashes or major bruising.  LABORATORY RESULTS: WBC 6600, hemoglobin 13.4, platelets 203,000, ANC 4100. Creatinine 0.64, calcium 8.3. TSH 4.13.   IMPRESSION AND RECOMMENDATIONS: A 69 year old female patient with known history of stage II Hodgkin disease treated in 2011/2012 with 4 cycles of ABVD chemotherapy followed by involved field radiation, has been in remission since then on followup CT scans. Currently admitted with seizure-like activity for at least  2 episodes and is on antiseizure  medication, clinically doing better, but otherwise weak and is resting. She does not have any B symptoms, superficial lymphadenopathy, or hepatosplenomegaly on exam. No new pancytopenias. Neurology is concerned that 1 possibility for seizure etiology might be Hodgkin disease involving the brain. MRI of the brain is pending at this time and will await its completion. Will follow up after MRI is back and make further recommendations. If it is negative for obvious brain lesions, then she might need further evaluation by neurology for other etiologies. The patient explained above, agreeable to this plan. If discharged soon, will follow up as outpatient as indicated.   Thank you for the referral. Please feel free to contact me for additional questions.    ____________________________ Rhett Bannister Ma Hillock, MD srp:sk D: 01/25/2014 00:37:00 ET T: 01/25/2014 00:58:48 ET JOB#: [id number]  cc: Aundreya Souffrant R. Ma Hillock, MD, <Dictator> Alveta Heimlich MD ELECTRONICALLY SIGNED 01/25/2014 19:55

## 2014-10-08 NOTE — H&P (Signed)
PATIENT NAME:  BLAIR, MESINA MR#:  [id number] DATE OF BIRTH:  02/23/1946  DATE OF ADMISSION:  01/23/2014  PRIMARY CARE PHYSICIAN: Dr. Lisette Grinder.  REFERRING PHYSICIAN: Dr. Jasmine December.  CHIEF COMPLAINT: Seizure today.  HISTORY OF PRESENT ILLNESS: A 69 year old Caucasian female with a history of polio, Hodgkin's lymphoma, hypertension, diabetes, was sent to ED from home due to a seizure today. The patient is alert, awake, oriented now. According to the patient's daughter, the patient developed 2 episodes of seizures today which lasted about 25 second and 5 seconds respectively. The patient was unresponsive with body shaking, tight. The patient was confused. Actually according to the patient's daughter, the patient has had confusion for the past 3 days. The patient has a headache, dizziness, and weakness and poor oral intake but the patient denies any fever or chills No chest pain, cough, sputum, shortness of breath, or urine problem.   PAST MEDICAL HISTORY: Hypertension, diabetes, polio, Hodgkin's lymphoma, emphysema.  SOCIAL HISTORY: Smokes 1-2 packs a day for 30 years. Denies any alcohol drinking or illicit drugs.   FAMILY HISTORY: Hypertension, diabetes, but no seizure. Has CVA in family history.    PAST SURGICAL HISTORY:  1. Leg surgery for polio.  2. Lumpectomy.  3. Bladder surgery.  4. Cholecystectomy.  5. Hysterectomy.  6. Carpal tunnel release.   ALLERGIES: PREDNISONE, SPORANOX.   HOME MEDICATIONS:  1. Zolpidem 5 mg p.o. once a day at bedtime.  2. Vitamin E 400 international units 1 capsule once a day.   3. Vitamin D3 at 2000 international units 1 tablet once a day.  4. Super B complex p.o. tablets 1 tablet once a day.  5. Singulair 10 mg p.o. daily.  6. Paxil 20 mg p.o. daily.  7. Omeprazole 20 mg p.o. daily.  8. Myrbetriq 20 mg p.o. daily. 9. Losartan 50 mg p.o. daily.  10. Fish oil 1000 mg p.o. daily.  11. Dulcolax, stool softener 100 mg 4 caplets every other day  p.r.n. 12. Cyclobenzaprine 10 mg p.o. at bedtime once a day.  13. Alprazolam 1 mg p.o. daily at bedtime.  14. Aspirin 81 mg p.o. daily.   REVIEW OF SYSTEMS:  CONSTITUTIONAL: The patient denies any fever or chills but has a headache, dizziness, and weakness.  EYES: No double vision or blurred vision.  EAR, NOSE, THROAT: No postnasal drip, slurred speech, or dysphagia.  CARDIOVASCULAR: No chest pain, palpitation, orthopnea, nocturnal dyspnea. No leg edema.  PULMONARY: No cough, sputum, shortness of breath, or hemoptysis.  GASTROINTESTINAL: No abdominal pain, nausea, vomiting, diarrhea. No melena or bloody stool.  GENITOURINARY: No dysuria, hematuria, but has incontinence sometimes.  SKIN: No rash or jaundice.  NEUROLOGIC: Positive for seizure, loss of consciousness, and altered mental status, confusion.  ENDOCRINE: No polyuria, polydipsia, heat or cold intolerance.  HEMATOLOGY: No easy bleeding or bleeding.   PHYSICAL EXAMINATION:  VITAL SIGNS: Temperature 99.9, blood pressure 127/75, pulse 71, oxygen saturation 94% on oxygen.  GENERAL: The patient is alert, awake, oriented in no acute distress.  HEENT: Pupils round, equal, reactive to light and accommodation. Moist oral mucosa, clear oropharynx.  NECK: Supple. No JVD or carotid bruit. No lymphadenopathy. No thyromegaly. CARDIOVASCULAR: S1, S2. Regular rate and rhythm. No murmurs, gallops.  PULMONARY: Bilateral air entry. No wheezing or rales. No use of accessory muscle to breathe.  ABDOMEN: Soft and no distention or tenderness. No organomegaly. Bowel sounds present.  EXTREMITIES: No edema, clubbing, or cyanosis. No calf tenderness. Bilateral pedal pulses present. Left lower extremity  fixation due to polio. Patient unable to move left lower extremity.  NEUROLOGY: A and O x 3. No focal deficit. Power 3-4 out of 5. Sensation intact.   LABORATORY DATA: Urinalysis is negative. Urine toxicology show benzodiazepine.  Chest x-ray is normal. CAT  scan of head: Chronic changes. No acute abnormality.   Acetaminophen less than 2, alcohol level less than 3. CBC in normal range. Glucose of 188, BUN 11, creatinine 0.91. Electrolytes normal. Salicylates 4.5.   EKG shows normal sinus rhythm at 92 BPM.   IMPRESSIONS:  1. New onset seizure.  2. Altered mental status.  3. Hodgkin's lymphoma.  4. Hypertension.  5. Diabetes.  6. Tobacco abuse.   PLAN OF TREATMENT:  1. The patient will be admitted to medical floor, start neuro check, seizure, fall, and aspiration precautions. We will get an EEG, MRI of her brain. Neurology consult. Ativan p.r.n. Start Keppra 500 mg IV b.i.d.  2. For diabetes, we will start a sliding scale, check hemoglobin A1c.  3. Smoking cessation was counseled for 4 minutes. We will give nicotine patch.  4. I discussed the patient's condition and plan of treatment with the patient.   TIME SPENT: About 65 minutes.    ____________________________ Demetrios Loll, MD qc:lt D: 01/23/2014 18:38:58 ET T: 01/23/2014 19:29:53 ET JOB#: [id number]  cc: Demetrios Loll, MD, <Dictator> Demetrios Loll MD ELECTRONICALLY SIGNED 01/27/2014 11:01

## 2014-10-08 NOTE — Discharge Summary (Signed)
PATIENT NAME:  [name], IBACH MR#:  [id number] DATE OF BIRTH:  [birth year]  DATE OF ADMISSION:  01/23/2014 DATE OF DISCHARGE:  01/25/2014   DISCHARGE DIAGNOSES:  1.  Seizure disorder.  2.  Encephalopathy, likely secondary to seizure disorder .  3.  Falls, unclear cause.  4.  History of lymphoma; followed by oncology.   DISCHARGE MEDICATIONS: Per [facility] medication reconciliation system. Basically she will have her Xanax cut from 2 mg to 1 mg at night. She will be on Keppra 500 b.i.d. Otherwise, usual medications.   HISTORY AND PHYSICAL: Please see detailed history and physical done on admission.   HOSPITAL COURSE: The patient was admitted with a couple of seizures and several falls. CT of her head was normal and EEG was unremarkable per neurology consult, who recommended going forward with an MRI, which was not done yet, but which is in the works. She was  standing and bathing herself well. 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 Laboratories were unremarkable. She is back to her usual status. She is anxious to get home. We will get her discharged today, if her MRI does not show findings that need further attention and is she is safe to walk with physical therapy.    ____________________________ Ocie Cornfield. Ouida Sills, MD mwa:ms D: 01/25/2014 06:42:34 ET T: 01/25/2014 06:58:31 ET JOB#: [id number]  cc: Ocie Cornfield. Ouida Sills, MD, <Dictator> Kirk Ruths MD ELECTRONICALLY SIGNED 01/25/2014 7:30

## 2014-10-08 NOTE — Consult Note (Signed)
Reason for Consult: Admit Date: 23-Jan-2014  Chief Complaint: "I couldn't think right so my daughter had me come in"  Reason for Consult: seizure   History of Present Illness: History of Present Illness:   Ms. Baril is a 69 yo woman with PMH notable for poliomyelitis and Hodgkin's lymphoma s/p resection and chemotherapy who presented yesterday to [facility] for evaluation of new onset seizures. The history is gathered from the patient, who is a poor historian for the events, as well as the medical record. I was unable to reach her daughter Lattie Haw who witnessed the events.  patient complains of not "feeling right in the head" for the past several days or weeks, with mild problems concentrating and mild memory difficulties over that time. Yesterday, she was noted by her daughter to suddenly become unresponsive with two separate episodes of convulsive motor activity concerning for seizure, associated with postictal confusion. She was [facility] into [facility] for further workup, where initial head CT showed no acute intracranial process.   ROS:  General fever   HEENT no complaints   Lungs no complaints   Cardiac no complaints   GI no complaints   GU no complaints   Musculoskeletal no complaints   Extremities no complaints   Skin no complaints   Neuro seizure   Endocrine no complaints   Psych no complaints   Past Medical/Surgical Hx:  Emphysema:   Fatty Liver:   Polio: at age 83 months  Hodgkin's Lymphoma:   Hypertension:   Tremors:   Degenerative Disc Disease:   Diabetes (Diet Controlled):   Depression:   Lumpectomy:   Bladder surgery:   Reflux surgery:   Rectal prolapse:   Cholecystectomy:   Hysterectomy:   Carpal Tunnel Release:   Leg surgery x 4:   Home Medications: Medication Instructions Last Modified Date/Time  ALPRAZolam 1 mg tablet 2 tab(s) orally once a day (at bedtime), As Needed 09-Aug-15 18:59  Fish Oil 1000 mg oral capsule 1 cap(s) orally once a day 09-Aug-15 18:59   Aspirin Low Dose 81 mg oral delayed release tablet 2 tab(s) orally once a day 09-Aug-15 18:59  Vitamin D3 2000 intl units oral tablet 1 tab(s) orally once a day 09-Aug-15 18:59  losartan 50 mg oral tablet 1 tab(s) orally once a day 09-Aug-15 18:59  omeprazole 20 mg oral delayed release capsule 1 cap(s) orally once a day 09-Aug-15 18:59  Dulcolax Stool Softener 100 mg oral capsule 4 cap(s) orally every other day as needed 09-Aug-15 18:59  PARoxetine 20 mg oral tablet 1 tab(s) orally once a day (at bedtime) 09-Aug-15 18:59  montelukast 10 mg oral tablet 1 tab(s) orally once a day (in the evening) 09-Aug-15 18:59  Myrbetriq 25 mg oral tablet, extended release 1 tab(s) orally once a day 09-Aug-15 18:59   Allergies:  Prednisone: Other  Sporanox: Other  Social/Family History: Lives With: children  Living Arrangements: apartment  Social History: Smokes 1-2 packs a day x 30 years. Denies EtOH or illicit drug use.  Family History: No FH of seizure disorder.   Vital Signs: **Vital Signs.:   10-Aug-15 06:12  Vital Signs Type Q 4hr  Temperature Temperature (F) 98  Celsius 36.6  Temperature Source oral  Pulse Pulse 74  Respirations Respirations 18  Systolic BP Systolic BP 599  Diastolic BP (mmHg) Diastolic BP (mmHg) 74  Mean BP 91  Pulse Ox % Pulse Ox % 92  Oxygen Delivery 1L   EXAM: Well-developed, well-nourished, in NAD. No conjunctival injection or scleral edema. Oropharynx  clear. No carotid bruits auscultated. Normal S1, S2 and regular cardiac rhythm on exam. Lungs clear to auscultation bilaterally. Abdomen soft and nontender. Peripheral pulses palpated. No clubbing, cyanosis, or edema in extremities.  MENTAL STATUS: Alert and oriented to person, place, and time. Language fluent and appropriate. Cognition and memory conversationally intact. CRANIAL NERVES: Visual fields full to confrontation. PERRL. EOMI. Facial sensation intact. Facial muscles full and symmetric. Hearing intact to  finger rub. Uvula midline with symmetric palatal elevation. Tongue midline without fasciculations. MOTOR: Normal bulk and tone. Strength 5/5 in deltoids, biceps, triceps, wrist flexors and extensors, and hand intrinsics bilaterally. Right leg strength 4/5 throughout with decreased tones, left leg 2/5 throughout except 4-/5 tibialis anterior and gastrocs, with markedly reduced tone in the left leg and notable muscular atrophy. REFLEXES: 2+ in biceps and triceps. Absent patella and achilles bilaterally. Equivocal plantar responses bilaterally. SENSORY: Intact to vibration and pinprick throughout. COORDINATION: No ataxia or dysmetria on finger-nose; unable to perform heel-shin GAIT: Not evaluated.  Lab Results: Thyroid:  10-Aug-15 03:34   Thyroid Stimulating Hormone 4.13 (0.45-4.50 (International Unit)  ----------------------- Pregnant patients have  different reference  ranges for TSH:  - - - - - - - - - -  Pregnant, first trimetser:  0.36 - 2.50 uIU/mL)  Hepatic:  09-Aug-15 13:15   Bilirubin, Total 0.5  Alkaline Phosphatase 95 (46-116 NOTE: New Reference Range 01/04/14)  SGPT (ALT) 50 (14-63 NOTE: New Reference Range 01/04/14)  SGOT (AST)  47  Total Protein, Serum 8.0  Albumin, Serum 3.9  General Ref:  09-Aug-15 13:15   Acetaminophen, Serum < 2 (10-30 POTENTIALLY TOXIC:  > 200 mcg/mL  > 50 mcg/mL at 12 hr after  ingestion  > 300 mcg/mL at 4 hr after  ingestion)  Salicylates, Serum  4.5 (0.0-2.8 Therapeutic 2.8-20.0 mg/dL Toxic >30.0 mg/dL)  Routine Chem:  09-Aug-15 13:15   Ethanol, S. < 3  Ethanol % (comp) < 0.003 (Result(s) reported on 23 Jan 2014 at 01:41PM.)  Lipase 76 (Result(s) reported on 23 Jan 2014 at 01:36PM.)  10-Aug-15 03:34   Glucose, Serum 79  BUN 13  Creatinine (comp) 0.64  Sodium, Serum 144  Potassium, Serum  3.4  Chloride, Serum  109  CO2, Serum 27  Calcium (Total), Serum  8.3  Anion Gap 8  Osmolality (calc) 286  eGFR (African American) >60   eGFR (Non-African American) >60 (eGFR values <78m/min/1.73 m2 may be an indication of chronic kidney disease (CKD). Calculated eGFR is useful in patients with stable renal function. The eGFR calculation will not be reliable in acutely ill patients when serum creatinine is changing rapidly. It is not useful in  patients on dialysis. The eGFR calculation may not be applicable to patients at the low and high extremes of body sizes, pregnant women, and vegetarians.)  Magnesium, Serum 2.0 (1.8-2.4 THERAPEUTIC RANGE: 4-7 mg/dL TOXIC: > 10 mg/dL  -----------------------)  Hemoglobin A1c ([facility]) 6.1 (The American Diabetes Association recommends that a primary goal of therapy should be <7% and that physicians should reevaluate the treatment regimen in patients with HbA1c values consistently >8%.)  Urine Drugs:  [id number]  Tricyclic Antidepressant, Ur Qual (comp) NEGATIVE (Result(s) reported on 23 Jan 2014 at 06:05PM.)  Amphetamines, Urine Qual. NEGATIVE  MDMA, Urine Qual. NEGATIVE  Cocaine Metabolite, Urine Qual. NEGATIVE  Opiate, Urine qual NEGATIVE  Phencyclidine, Urine Qual. NEGATIVE  Cannabinoid, Urine Qual. NEGATIVE  Barbiturates, Urine Qual. NEGATIVE  Benzodiazepine, Urine Qual. POSITIVE (----------------- The URINE DRUG SCREEN provides only a  preliminary, unconfirmed analytical test result and should not be used for non-medical  purposes.  Clinical consideration and professional judgment should be  applied to any positive drug screen result due to possible interfering substances.  A more specific alternate chemical method must be used in order to obtain a confirmed analytical result.  Gas chromatography/mass spectrometry (GC/MS) is the preferred confirmatory method.)  Methadone, Urine Qual. NEGATIVE  Routine UA:  09-Aug-15 17:23   Color (UA) Yellow  Clarity (UA) Clear  Glucose (UA) Negative  Bilirubin (UA) Negative  Ketones (UA) Trace  Specific Gravity (UA) 1.019   Blood (UA) Negative  pH (UA) 5.0  Protein (UA) 100 mg/dL  Nitrite (UA) Negative  Leukocyte Esterase (UA) Negative (Result(s) reported on 23 Jan 2014 at 05:43PM.)  RBC (UA) 2 /HPF  WBC (UA) 3 /HPF  Bacteria (UA) NONE SEEN  Epithelial Cells (UA) NONE SEEN  Mucous (UA) PRESENT (Result(s) reported on 23 Jan 2014 at 05:43PM.)  Routine Hem:  10-Aug-15 03:34   WBC (CBC) 6.6  RBC (CBC) 4.41  Hemoglobin (CBC) 13.4  Hematocrit (CBC) 41.2  Platelet Count (CBC) 203  MCV 93  MCH 30.4  MCHC 32.6  RDW  15.6  Neutrophil % 62.4  Lymphocyte % 27.3  Monocyte % 8.0  Eosinophil % 1.8  Basophil % 0.5  Neutrophil # 4.1  Lymphocyte # 1.8  Monocyte # 0.5  Eosinophil # 0.1  Basophil # 0.0 (Result(s) reported on 24 Jan 2014 at 04:42AM.)   Radiology Results: CT:    09-Aug-15 13:39, CT Head Without Contrast  CT Head Without Contrast   REASON FOR EXAM:    seizure  COMMENTS:   May transport without cardiac monitor    PROCEDURE: CT  - CT HEAD WITHOUT CONTRAST  - Jan 23 2014  1:39PM     CLINICAL DATA:  Unresponsive. Stroke-like symptoms. History of  Hodgkin's lymphoma.    EXAM:  CT HEAD WITHOUT CONTRAST    TECHNIQUE:  Contiguous axial images were obtained from the base of the skull  through the vertex without contrast.  COMPARISON:  12/12/2011 MR brain.  12/04/11 CT head.    FINDINGS:  Patchy white low attenuation similar to the prior MR FLAIR series,  likely related to chronic microvascular ischemic changes. No  evidence for acute infarction, hemorrhage, mass lesion,  hydrocephalus, or extra-axial fluid. Mild cerebral and cerebellar  atrophy without focal areas of cortical infarction. Calvarium  intact. Vascular calcification No sinus or mastoid disease. Similar  appearance to priors.     IMPRESSION:  Chronic changes as described. No acute abnormalities detected which  might explain unresponsiveness.  Electronically Signed    By: Rolla Flatten M.D.    On: 01/23/2014 14:07          Verified By: Staci Righter, M.D.,   Impression/Recommendations: Recommendations:   Ms. Quinnell is a 69 yo woman with a history of Hodgkin's lymphoma and poliomyelitis who presents with two convulsive events associated with confusion and loss of consciousness concerning for seizure. 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 Routine EEG yesterday was normal without epileptiform discharges.  the benign EEG is reassuring, it does not rule out seizure tendency and the history provided is most consistent with seizures. The vague complaints of cognitive difficulties, while not detected on bedside exam, are suggestive of some mild progressive encephalopathy. Together with a new history of seizures, my biggest concern  would be recurrence of her lymphoma or some other neoplasm with brain involvment, versus a new stroke that is otherwise neurologically asymptomatic. Continue levetiracetam 560m PO BID until outpatient neurology followupBrain MRI with and without contrast to evaluate for intracranial mass or ischemic infarct you for the opportunity to participate in Ms. GFiala care. Please page neurology consults with further questions.  Electronic Signatures: HCarmin Richmond(MD)  (Signed 10-Aug-15 12:53)  Authored: Consult, History of Present Illness, Review of Systems, PAST MEDICAL/SURGICAL HISTORY, HOME MEDICATIONS, ALLERGIES, Social/Family History, NURSING VITAL SIGNS, Physical Exam-, LAB RESULTS, RADIOLOGY RESULTS, Recommendations   Last Updated: 10-Aug-15 12:53 by HCarmin Richmond(MD)

## 2014-10-08 NOTE — Discharge Summary (Signed)
PATIENT NAME:  [name], BLITZER MR#:  [id number] DATE OF BIRTH:  1945/10/31  DATE OF ADMISSION:  01/23/2014 DATE OF DISCHARGE:  01/26/2014  ADDENDUM:  The patient ended up staying overnight until 01/26/2014 in order to get an MRI done and to ensure her mental status is back to normal. Her daughter was quite concerned about that. After a long discussion, we held the discharge. I did discuss the patient that with the patient today. She feels her daughter is " a drama queen" and her older daughter agrees with her discharge, and she feels like she is totally her normal self. She does appear to be oriented, discussing things normally. She understands her situation.   She will be discharged home on the [place], as planned. She will have followup with Dr. Gilford Rile soon.   ____________________________ Ocie Cornfield. Ouida Sills, MD mwa:MT D: 01/26/2014 07:37:13 ET T: 01/26/2014 10:11:48 ET JOB#: [id number]  cc: Ocie Cornfield. Ouida Sills, MD, <Dictator> Kirk Ruths MD ELECTRONICALLY SIGNED 01/26/2014 11:38

## 2014-12-13 ENCOUNTER — Other Ambulatory Visit: Payer: Self-pay

## 2014-12-13 DIAGNOSIS — C8191 Hodgkin lymphoma, unspecified, lymph nodes of head, face, and neck: Secondary | ICD-10-CM | POA: Insufficient documentation

## 2014-12-13 DIAGNOSIS — C8590 Non-Hodgkin lymphoma, unspecified, unspecified site: Secondary | ICD-10-CM

## 2014-12-13 DIAGNOSIS — D751 Secondary polycythemia: Secondary | ICD-10-CM

## 2014-12-14 ENCOUNTER — Inpatient Hospital Stay: Payer: Medicare Other | Attending: Internal Medicine

## 2014-12-14 ENCOUNTER — Inpatient Hospital Stay: Payer: Medicare Other

## 2014-12-14 DIAGNOSIS — C8590 Non-Hodgkin lymphoma, unspecified, unspecified site: Secondary | ICD-10-CM

## 2014-12-14 DIAGNOSIS — D751 Secondary polycythemia: Secondary | ICD-10-CM | POA: Diagnosis present

## 2014-12-14 LAB — HEMATOCRIT: HEMATOCRIT: 47.7 % — AB (ref 35.0–47.0)

## 2015-01-17 DIAGNOSIS — R809 Proteinuria, unspecified: Secondary | ICD-10-CM | POA: Insufficient documentation

## 2015-01-17 DIAGNOSIS — E034 Atrophy of thyroid (acquired): Secondary | ICD-10-CM | POA: Insufficient documentation

## 2015-02-01 DIAGNOSIS — M25571 Pain in right ankle and joints of right foot: Secondary | ICD-10-CM | POA: Insufficient documentation

## 2015-06-27 ENCOUNTER — Other Ambulatory Visit: Payer: Self-pay | Admitting: *Deleted

## 2015-06-27 DIAGNOSIS — C8170 Other classical Hodgkin lymphoma, unspecified site: Secondary | ICD-10-CM

## 2015-06-30 ENCOUNTER — Inpatient Hospital Stay: Payer: Medicare Other

## 2015-06-30 ENCOUNTER — Encounter: Payer: Self-pay | Admitting: Internal Medicine

## 2015-06-30 ENCOUNTER — Inpatient Hospital Stay: Payer: Medicare Other | Attending: Internal Medicine | Admitting: Internal Medicine

## 2015-06-30 VITALS — BP 133/92 | HR 68 | Temp 96.8°F | Resp 20 | Ht 60.0 in | Wt 124.1 lb

## 2015-06-30 DIAGNOSIS — C8170 Other classical Hodgkin lymphoma, unspecified site: Secondary | ICD-10-CM

## 2015-06-30 DIAGNOSIS — Z79899 Other long term (current) drug therapy: Secondary | ICD-10-CM | POA: Insufficient documentation

## 2015-06-30 DIAGNOSIS — C8190 Hodgkin lymphoma, unspecified, unspecified site: Secondary | ICD-10-CM | POA: Insufficient documentation

## 2015-06-30 DIAGNOSIS — Z7982 Long term (current) use of aspirin: Secondary | ICD-10-CM | POA: Insufficient documentation

## 2015-06-30 DIAGNOSIS — I10 Essential (primary) hypertension: Secondary | ICD-10-CM | POA: Insufficient documentation

## 2015-06-30 DIAGNOSIS — F17210 Nicotine dependence, cigarettes, uncomplicated: Secondary | ICD-10-CM | POA: Diagnosis not present

## 2015-06-30 DIAGNOSIS — E119 Type 2 diabetes mellitus without complications: Secondary | ICD-10-CM | POA: Insufficient documentation

## 2015-06-30 DIAGNOSIS — D751 Secondary polycythemia: Secondary | ICD-10-CM | POA: Diagnosis not present

## 2015-06-30 DIAGNOSIS — R634 Abnormal weight loss: Secondary | ICD-10-CM | POA: Insufficient documentation

## 2015-06-30 DIAGNOSIS — F329 Major depressive disorder, single episode, unspecified: Secondary | ICD-10-CM | POA: Insufficient documentation

## 2015-06-30 DIAGNOSIS — K219 Gastro-esophageal reflux disease without esophagitis: Secondary | ICD-10-CM | POA: Diagnosis not present

## 2015-06-30 LAB — CREATININE, SERUM
Creatinine, Ser: 0.48 mg/dL (ref 0.44–1.00)
GFR calc non Af Amer: >60 mL/min (ref >60)

## 2015-06-30 LAB — HEPATIC FUNCTION PANEL
ALT: 19 U/L (ref 14–54)
AST: 17 U/L (ref 15–41)
Albumin: 3.6 g/dL (ref 3.5–5.0)
Alkaline Phosphatase: 59 U/L (ref 38–126)
Bilirubin, Direct: <0.1 mg/dL — ABNORMAL LOW (ref 0.1–0.5)
TOTAL PROTEIN: 7.1 g/dL (ref 6.5–8.1)
Total Bilirubin: 0.5 mg/dL (ref 0.3–1.2)

## 2015-06-30 LAB — CBC WITH DIFFERENTIAL/PLATELET
BASOS ABS: 0 10*3/uL (ref 0–0.1)
Basophils Relative: 1 %
EOS ABS: 0.2 10*3/uL (ref 0–0.7)
EOS PCT: 4 %
HCT: 47 % (ref 35.0–47.0)
Hemoglobin: 15.8 g/dL (ref 12.0–16.0)
Lymphocytes Relative: 35 %
Lymphs Abs: 1.8 10*3/uL (ref 1.0–3.6)
MCH: 31.8 pg (ref 26.0–34.0)
MCHC: 33.7 g/dL (ref 32.0–36.0)
MCV: 94.3 fL (ref 80.0–100.0)
Monocytes Absolute: 0.4 10*3/uL (ref 0.2–0.9)
Monocytes Relative: 7 %
NEUTROS PCT: 53 %
Neutro Abs: 2.8 10*3/uL (ref 1.4–6.5)
PLATELETS: 180 10*3/uL (ref 150–440)
RBC: 4.98 MIL/uL (ref 3.80–5.20)
RDW: 14.6 % — ABNORMAL HIGH (ref 11.5–14.5)
WBC: 5.2 10*3/uL (ref 3.6–11.0)

## 2015-06-30 LAB — LACTATE DEHYDROGENASE: LDH: 135 U/L (ref 98–192)

## 2015-06-30 NOTE — Progress Notes (Signed)
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 I reviewed the patient's prior charts/pertinent labs/imaging in detail; findings are summarized above.    Chronic shortness of breath is not any worse. No new cough or hemoptysis. patient's appetite is poor.  Patient has lost about 30 pounds in the last many months. Denies any nausea vomiting. Denies any headaches. Denies any unusual lumps or bumps. Denies any night sweats. Denies any fevers.  REVIEW OF SYSTEMS:  A complete 10 point review of system is done which is negative except mentioned above/history of present illness.   PAST MEDICAL HISTORY :  Past Medical History  Diagnosis Date  . Hypertension   . Diabetes mellitus ([place])   . GERD (gastroesophageal reflux disease)   . Postpolio syndrome     left leg paralysis  . Depression   . Degenerative disc disease, cervical     PAST SURGICAL HISTORY :   Past Surgical History  Procedure Laterality Date  . Cholecystectomy    . Rectal prolapse repair  April 2010  . Carpal tunnel release  2004    Bilateral  . Breast lumpectomy    . Bladder surgery      FAMILY HISTORY :  No family history on file.  SOCIAL HISTORY:   Social History  Substance Use Topics  . Smoking status: Current Every Day Smoker -- 1.00 packs/day    Types: Cigarettes  . Smokeless tobacco: Not on file  . Alcohol Use: No    ALLERGIES:  is allergic to prednisone and trazodone.  MEDICATIONS:  Current Outpatient Prescriptions  Medication Sig Dispense Refill  . aspirin 81 MG tablet Take 81 mg by mouth daily.     . B Complex Vitamins (VITAMIN-B COMPLEX) TABS Take 1 tablet by mouth daily.    . cholecalciferol (VITAMIN D) 1000 units tablet Take 1,000 Units by mouth daily.    . clonazePAM (KLONOPIN) 0.5 MG tablet Take 1 tablet by mouth at bedtime.    . divalproex (DEPAKOTE) 250 MG DR tablet Take 1 tablet by mouth 2 (two) times daily.    . hydrochlorothiazide (HYDRODIURIL) 12.5 MG tablet Take 1 tablet by mouth as needed.    Marland Kitchen levothyroxine (SYNTHROID, LEVOTHROID) 50 MCG tablet Take 1 tablet by mouth daily.    Marland Kitchen losartan (COZAAR) 100 MG tablet Take 1 tablet by mouth daily.    . mirabegron ER (MYRBETRIQ) 50 MG TB24 tablet Take 1 tablet by mouth daily.    . OMEGA-3 FATTY ACIDS PO Take 1 capsule by mouth daily.    Marland Kitchen PARoxetine (PAXIL) 20 MG tablet Take 1 tablet by mouth daily.    . propranolol ER (INDERAL LA) 60 MG 24 hr capsule Take 1 capsule by mouth daily.     No current facility-administered medications for this visit.    PHYSICAL EXAMINATION:   BP 133/92 mmHg  Pulse 68  Temp(Src) 96.8 F (36 C) (Tympanic)  Resp 20  Ht 5' (1.524 m)  Wt 124 lb 1.9 oz (56.3 kg)  BMI 24.24 kg/m2  SpO2 93%  Filed Weights   06/30/15 1418  Weight: 124  lb 1.9 oz (56.3 kg)    GENERAL: Thin built moderately nourished. Alert, no distress and comfortable.   Accompanied by her daughter. Patient has tremors of her head /upper extremity [ chronic] EYES: no pallor or icterus OROPHARYNX: no thrush or ulceration; poor dentition  NECK: supple, no masses felt LYMPH:  no palpable lymphadenopathy in the cervical, axillary or inguinal regions LUNGS: clear to auscultation and  No wheeze or crackles HEART/CVS: regular rate & rhythm and no murmurs; No lower extremity edema ABDOMEN:abdomen soft, non-tender and normal bowel sounds Musculoskeletal:no cyanosis of digits;  Patient's left leg is in a brace [ Chronic] PSYCH: alert & oriented x 3 with fluent speech NEURO: no focal motor/sensory deficits SKIN:  no rashes or  significant lesions  LABORATORY DATA:  I have reviewed the data as listed    Component Value Date/Time   NA 129* 02/13/2014 1606   K 3.9 02/13/2014 1606   CL 94* 02/13/2014 1606   CO2 27 02/13/2014 1606   GLUCOSE 121* 02/13/2014 1606   BUN 7 02/13/2014 1606   CREATININE 0.59* 06/29/2014 1042   CALCIUM 8.3* 02/13/2014 1606   PROT 7.3 06/29/2014 1042   ALBUMIN 3.4 06/29/2014 1042   AST 31 06/29/2014 1042   ALT 69* 06/29/2014 1042   ALKPHOS 99 06/29/2014 1042   BILITOT 0.3 06/29/2014 1042   GFRNONAA >60 06/29/2014 1042   GFRNONAA >60 02/13/2014 1606   GFRAA >60 06/29/2014 1042   GFRAA >60 02/13/2014 1606    No results found for: SPEP, UPEP  Lab Results  Component Value Date   WBC 5.2 06/30/2015   NEUTROABS 2.8 06/30/2015   HGB 15.8 06/30/2015   HCT 47.0 06/30/2015   MCV 94.3 06/30/2015   PLT 180 06/30/2015      Chemistry      Component Value Date/Time   NA 129* 02/13/2014 1606   K 3.9 02/13/2014 1606   CL 94* 02/13/2014 1606   CO2 27 02/13/2014 1606   BUN 7 02/13/2014 1606   CREATININE 0.59* 06/29/2014 1042      Component Value Date/Time   CALCIUM 8.3* 02/13/2014 1606   ALKPHOS 99 06/29/2014 1042   AST 31 06/29/2014 1042   ALT 69* 06/29/2014 1042   BILITOT 0.3 06/29/2014 1042       RADIOGRAPHIC STUDIES: I have personally reviewed the radiological images as listed and agreed with the findings in the report. No results found.   ASSESSMENT & PLAN:   #  Hodgkin's lymphoma stage II- [ finish 2011];  Clinically no evidence of recurrence.  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  #  Weight loss of about 30 pounds in the last many months-  Recommend CT chest abdomen pelvis especially with history of smoking  In the next one to 2 weeks.  #  Active smoker-  Recommended quitting;  Patient not to  Jennet Maduro to quit.   #  The CT scan is negative;   Patient follow-up with [place] in one year;  Otherwise patient will follow-up sooner.  # 25 minutes face-to-face with the patient discussing the above plan of care; more than 50% of time spent on prognosis/ natural history; counseling and coordination.      Cammie Sickle, MD 06/30/2015 2:24 PM

## 2015-07-11 ENCOUNTER — Ambulatory Visit
Admission: RE | Admit: 2015-07-11 | Discharge: 2015-07-11 | Disposition: A | Discharge status: Home / Self Care | Payer: Medicare Other | Source: Ambulatory Visit | Attending: Internal Medicine | Admitting: Internal Medicine

## 2015-07-11 DIAGNOSIS — F17200 Nicotine dependence, unspecified, uncomplicated: Secondary | ICD-10-CM | POA: Diagnosis present

## 2015-07-11 DIAGNOSIS — R634 Abnormal weight loss: Secondary | ICD-10-CM | POA: Diagnosis not present

## 2015-07-11 DIAGNOSIS — Z8589 Personal history of malignant neoplasm of other organs and systems: Secondary | ICD-10-CM | POA: Diagnosis present

## 2015-07-11 DIAGNOSIS — J432 Centrilobular emphysema: Secondary | ICD-10-CM | POA: Insufficient documentation

## 2015-07-11 DIAGNOSIS — I2510 Atherosclerotic heart disease of native coronary artery without angina pectoris: Secondary | ICD-10-CM | POA: Diagnosis not present

## 2015-07-11 HISTORY — DX: Malignant (primary) neoplasm, unspecified: C80.1

## 2015-07-11 MED ORDER — IOHEXOL 300 MG/ML  SOLN
100.0000 mL | Freq: Once | INTRAMUSCULAR | Status: AC | PRN
  Administered 2015-07-11: 100 mL via INTRAVENOUS

## 2015-07-13 ENCOUNTER — Telehealth: Payer: Self-pay | Admitting: Internal Medicine

## 2015-07-13 NOTE — Telephone Encounter (Signed)
Spoke to patient at length regarding the results of the CT scan. Subtle GE junction fullness discussed; patient does not complain of overwhelming dysphagia. States her weight loss was been gradual. Recommend EGD. Also reviewed the upper endoscopy- done in 2013  May negative. However, if her symptoms get worse- recommend evaluation with GI. She agrees.   # Shawn,  Patient might be a good candidate for screening lung CTs. Please contact pt. Thanks,

## 2015-07-13 NOTE — Telephone Encounter (Signed)
RN Noted MD response.

## 2015-11-13 ENCOUNTER — Emergency Department: Payer: Medicare Other

## 2015-11-13 ENCOUNTER — Inpatient Hospital Stay
Admission: EM | Admit: 2015-11-13 | Discharge: 2015-11-20 | DRG: 190 | Disposition: A | Discharge status: Skilled Nursing Facility | Payer: Medicare Other | Attending: Internal Medicine | Admitting: Internal Medicine

## 2015-11-13 DIAGNOSIS — J209 Acute bronchitis, unspecified: Secondary | ICD-10-CM | POA: Diagnosis present

## 2015-11-13 DIAGNOSIS — F329 Major depressive disorder, single episode, unspecified: Secondary | ICD-10-CM | POA: Diagnosis present

## 2015-11-13 DIAGNOSIS — E039 Hypothyroidism, unspecified: Secondary | ICD-10-CM | POA: Diagnosis present

## 2015-11-13 DIAGNOSIS — J441 Chronic obstructive pulmonary disease with (acute) exacerbation: Secondary | ICD-10-CM | POA: Diagnosis present

## 2015-11-13 DIAGNOSIS — G8314 Monoplegia of lower limb affecting left nondominant side: Secondary | ICD-10-CM | POA: Diagnosis present

## 2015-11-13 DIAGNOSIS — Z79899 Other long term (current) drug therapy: Secondary | ICD-10-CM | POA: Diagnosis not present

## 2015-11-13 DIAGNOSIS — R634 Abnormal weight loss: Secondary | ICD-10-CM

## 2015-11-13 DIAGNOSIS — Z833 Family history of diabetes mellitus: Secondary | ICD-10-CM

## 2015-11-13 DIAGNOSIS — E119 Type 2 diabetes mellitus without complications: Secondary | ICD-10-CM | POA: Diagnosis present

## 2015-11-13 DIAGNOSIS — J9601 Acute respiratory failure with hypoxia: Secondary | ICD-10-CM | POA: Diagnosis present

## 2015-11-13 DIAGNOSIS — E440 Moderate protein-calorie malnutrition: Secondary | ICD-10-CM | POA: Insufficient documentation

## 2015-11-13 DIAGNOSIS — J9622 Acute and chronic respiratory failure with hypercapnia: Secondary | ICD-10-CM | POA: Diagnosis not present

## 2015-11-13 DIAGNOSIS — G14 Postpolio syndrome: Secondary | ICD-10-CM | POA: Diagnosis present

## 2015-11-13 DIAGNOSIS — Z8572 Personal history of non-Hodgkin lymphomas: Secondary | ICD-10-CM | POA: Diagnosis not present

## 2015-11-13 DIAGNOSIS — I10 Essential (primary) hypertension: Secondary | ICD-10-CM | POA: Diagnosis present

## 2015-11-13 DIAGNOSIS — Z7982 Long term (current) use of aspirin: Secondary | ICD-10-CM

## 2015-11-13 DIAGNOSIS — F17210 Nicotine dependence, cigarettes, uncomplicated: Secondary | ICD-10-CM | POA: Diagnosis present

## 2015-11-13 DIAGNOSIS — J9621 Acute and chronic respiratory failure with hypoxia: Secondary | ICD-10-CM | POA: Diagnosis not present

## 2015-11-13 DIAGNOSIS — R0902 Hypoxemia: Secondary | ICD-10-CM

## 2015-11-13 DIAGNOSIS — J440 Chronic obstructive pulmonary disease with acute lower respiratory infection: Principal | ICD-10-CM | POA: Diagnosis present

## 2015-11-13 DIAGNOSIS — K219 Gastro-esophageal reflux disease without esophagitis: Secondary | ICD-10-CM | POA: Diagnosis present

## 2015-11-13 DIAGNOSIS — Z888 Allergy status to other drugs, medicaments and biological substances status: Secondary | ICD-10-CM | POA: Diagnosis not present

## 2015-11-13 LAB — BASIC METABOLIC PANEL
Anion gap: 12 (ref 5–15)
BUN: 35 mg/dL — AB (ref 6–20)
CHLORIDE: 95 mmol/L — AB (ref 101–111)
CO2: 30 mmol/L (ref 22–32)
Calcium: 9.9 mg/dL (ref 8.9–10.3)
Creatinine, Ser: 0.51 mg/dL (ref 0.44–1.00)
GFR calc Af Amer: >60 mL/min (ref >60)
GFR calc non Af Amer: >60 mL/min (ref >60)
GLUCOSE: 138 mg/dL — AB (ref 65–99)
POTASSIUM: 4.2 mmol/L (ref 3.5–5.1)
Sodium: 137 mmol/L (ref 135–145)

## 2015-11-13 LAB — CBC
HEMATOCRIT: 49 % — AB (ref 35.0–47.0)
Hemoglobin: 17 g/dL — ABNORMAL HIGH (ref 12.0–16.0)
MCH: 34 pg (ref 26.0–34.0)
MCHC: 34.7 g/dL (ref 32.0–36.0)
MCV: 97.8 fL (ref 80.0–100.0)
Platelets: 241 10*3/uL (ref 150–440)
RBC: 5.01 MIL/uL (ref 3.80–5.20)
RDW: 13.9 % (ref 11.5–14.5)
WBC: 14.5 10*3/uL — ABNORMAL HIGH (ref 3.6–11.0)

## 2015-11-13 LAB — GLUCOSE, CAPILLARY
GLUCOSE-CAPILLARY: 123 mg/dL — AB (ref 65–99)
GLUCOSE-CAPILLARY: 184 mg/dL — AB (ref 65–99)
GLUCOSE-CAPILLARY: 154 mg/dL — AB (ref 65–99)

## 2015-11-13 LAB — BRAIN NATRIURETIC PEPTIDE: B Natriuretic Peptide: 478 pg/mL — ABNORMAL HIGH (ref 0.0–100.0)

## 2015-11-13 LAB — HEMOGLOBIN A1C: Hgb A1c MFr Bld: 5.5 % (ref 4.0–6.0)

## 2015-11-13 MED ORDER — INSULIN ASPART 100 UNIT/ML ~~LOC~~ SOLN: 0.0000 [IU] | Freq: Every day | SUBCUTANEOUS | Status: DC

## 2015-11-13 MED ORDER — IPRATROPIUM-ALBUTEROL 0.5-2.5 (3) MG/3ML IN SOLN
3.0000 mL | RESPIRATORY_TRACT | Status: DC
  Administered 2015-11-13 – 2015-11-20 (×39): 3 mL via RESPIRATORY_TRACT
  Filled 2015-11-13 (×41): qty 3

## 2015-11-13 MED ORDER — SODIUM CHLORIDE 0.9 % IV SOLN
INTRAVENOUS | Status: DC
  Administered 2015-11-13 – 2015-11-14 (×2): via INTRAVENOUS

## 2015-11-13 MED ORDER — NICOTINE 21 MG/24HR TD PT24
21.0000 mg | MEDICATED_PATCH | Freq: Every day | TRANSDERMAL | Status: DC
  Administered 2015-11-16 – 2015-11-20 (×5): 21 mg via TRANSDERMAL
  Filled 2015-11-13 (×6): qty 1

## 2015-11-13 MED ORDER — CLONAZEPAM 0.5 MG PO TABS
0.5000 mg | ORAL_TABLET | Freq: Every day | ORAL | Status: DC
  Administered 2015-11-13 – 2015-11-19 (×7): 0.5 mg via ORAL
  Filled 2015-11-13 (×7): qty 1

## 2015-11-13 MED ORDER — PAROXETINE HCL 20 MG PO TABS
20.0000 mg | ORAL_TABLET | Freq: Every day | ORAL | Status: DC
  Administered 2015-11-13 – 2015-11-19 (×7): 20 mg via ORAL
  Filled 2015-11-13 (×7): qty 1

## 2015-11-13 MED ORDER — LOSARTAN POTASSIUM 50 MG PO TABS
100.0000 mg | ORAL_TABLET | Freq: Every day | ORAL | Status: DC
  Administered 2015-11-14 – 2015-11-20 (×7): 100 mg via ORAL
  Filled 2015-11-13 (×7): qty 2

## 2015-11-13 MED ORDER — ALBUTEROL SULFATE (2.5 MG/3ML) 0.083% IN NEBU: 3.0000 mL | INHALATION_SOLUTION | RESPIRATORY_TRACT | Status: DC | PRN

## 2015-11-13 MED ORDER — ALBUTEROL SULFATE (2.5 MG/3ML) 0.083% IN NEBU
5.0000 mg | INHALATION_SOLUTION | Freq: Once | RESPIRATORY_TRACT | Status: AC
  Administered 2015-11-13: 5 mg via RESPIRATORY_TRACT
  Filled 2015-11-13: qty 6

## 2015-11-13 MED ORDER — DIVALPROEX SODIUM 250 MG PO DR TAB
250.0000 mg | DELAYED_RELEASE_TABLET | Freq: Two times a day (BID) | ORAL | Status: DC
  Administered 2015-11-13 – 2015-11-20 (×14): 250 mg via ORAL
  Filled 2015-11-13 (×15): qty 1

## 2015-11-13 MED ORDER — SENNOSIDES-DOCUSATE SODIUM 8.6-50 MG PO TABS
1.0000 | ORAL_TABLET | Freq: Every evening | ORAL | Status: DC | PRN
  Administered 2015-11-17: 1 via ORAL
  Filled 2015-11-13: qty 1

## 2015-11-13 MED ORDER — LEVOTHYROXINE SODIUM 50 MCG PO TABS
50.0000 ug | ORAL_TABLET | Freq: Every day | ORAL | Status: DC
  Administered 2015-11-14 – 2015-11-20 (×7): 50 ug via ORAL
  Filled 2015-11-13 (×7): qty 1

## 2015-11-13 MED ORDER — ENOXAPARIN SODIUM 40 MG/0.4ML ~~LOC~~ SOLN
40.0000 mg | SUBCUTANEOUS | Status: DC
  Administered 2015-11-13 – 2015-11-19 (×7): 40 mg via SUBCUTANEOUS
  Filled 2015-11-13 (×7): qty 0.4

## 2015-11-13 MED ORDER — LEVOFLOXACIN IN D5W 750 MG/150ML IV SOLN: 750.0000 mg | INTRAVENOUS | Status: DC

## 2015-11-13 MED ORDER — ACETAMINOPHEN 325 MG PO TABS
650.0000 mg | ORAL_TABLET | Freq: Four times a day (QID) | ORAL | Status: DC | PRN
Start: 2015-11-13 — End: 2015-11-20

## 2015-11-13 MED ORDER — B COMPLEX-C PO TABS
1.0000 | ORAL_TABLET | Freq: Every day | ORAL | Status: DC
  Administered 2015-11-14 – 2015-11-20 (×7): 1 via ORAL
  Filled 2015-11-13 (×8): qty 1

## 2015-11-13 MED ORDER — LEVOFLOXACIN IN D5W 750 MG/150ML IV SOLN
750.0000 mg | INTRAVENOUS | Status: DC
  Administered 2015-11-13: 20:00:00 750 mg via INTRAVENOUS
  Filled 2015-11-13 (×2): qty 150

## 2015-11-13 MED ORDER — ACETAMINOPHEN 650 MG RE SUPP: 650.0000 mg | Freq: Four times a day (QID) | RECTAL | Status: DC | PRN

## 2015-11-13 MED ORDER — OMEGA-3-ACID ETHYL ESTERS 1 G PO CAPS
1.0000 g | ORAL_CAPSULE | Freq: Every day | ORAL | Status: DC
  Administered 2015-11-13 – 2015-11-20 (×8): 1 g via ORAL
  Filled 2015-11-13 (×8): qty 1

## 2015-11-13 MED ORDER — MIRABEGRON ER 50 MG PO TB24
50.0000 mg | ORAL_TABLET | Freq: Every day | ORAL | Status: DC
  Administered 2015-11-13 – 2015-11-19 (×7): 50 mg via ORAL
  Filled 2015-11-13 (×8): qty 1

## 2015-11-13 MED ORDER — ASPIRIN EC 81 MG PO TBEC
81.0000 mg | DELAYED_RELEASE_TABLET | Freq: Every day | ORAL | Status: DC
  Administered 2015-11-14 – 2015-11-19 (×6): 81 mg via ORAL
  Filled 2015-11-13 (×8): qty 1

## 2015-11-13 MED ORDER — PROPRANOLOL HCL ER 60 MG PO CP24
60.0000 mg | ORAL_CAPSULE | Freq: Every day | ORAL | Status: DC
  Administered 2015-11-14 – 2015-11-17 (×3): 60 mg via ORAL
  Filled 2015-11-13 (×5): qty 1

## 2015-11-13 MED ORDER — VITAMIN D 1000 UNITS PO TABS
1000.0000 [IU] | ORAL_TABLET | Freq: Every day | ORAL | Status: DC
  Administered 2015-11-14 – 2015-11-20 (×7): 1000 [IU] via ORAL
  Filled 2015-11-13 (×9): qty 1

## 2015-11-13 MED ORDER — METHYLPREDNISOLONE SODIUM SUCC 125 MG IJ SOLR
60.0000 mg | Freq: Three times a day (TID) | INTRAMUSCULAR | Status: DC
  Administered 2015-11-13 – 2015-11-14 (×3): 60 mg via INTRAVENOUS
  Filled 2015-11-13 (×3): qty 2

## 2015-11-13 MED ORDER — ALPRAZOLAM 0.25 MG PO TABS
0.2500 mg | ORAL_TABLET | Freq: Two times a day (BID) | ORAL | Status: DC | PRN
  Administered 2015-11-17 – 2015-11-20 (×4): 0.25 mg via ORAL
  Filled 2015-11-13 (×4): qty 1

## 2015-11-13 MED ORDER — INSULIN ASPART 100 UNIT/ML ~~LOC~~ SOLN
0.0000 [IU] | Freq: Three times a day (TID) | SUBCUTANEOUS | Status: DC
  Administered 2015-11-13 – 2015-11-14 (×3): 1 [IU] via SUBCUTANEOUS
  Filled 2015-11-13 (×3): qty 1

## 2015-11-13 NOTE — H&P (Addendum)
[facility] [place] NAME: [name]    MR#:  [id number]  DATE OF BIRTH:  15-Jun-1946  DATE OF ADMISSION:  11/13/2015  PRIMARY CARE PHYSICIAN: Madelyn Brunner, MD   REQUESTING/REFERRING PHYSICIAN: Dr. Marcelene Butte  CHIEF COMPLAINT:   Shortness of breath and wheezing HISTORY OF PRESENT ILLNESS:  [name]  is a 70 y.o. female with a known history of Post polio syndrome, Diabetes and COPD who is not on oxygen who presents with above complaint. Patient reports over the past 2-3 days she's had increasing shortness of breath, cough, low-grade fevers and yellow sputum. She also says for the past day she's had significant wheezing. She was brought to the ER where she will was having tachypnea and evidence of tripoding. She has been given nebulizer treatments and continues to have wheezing and also has a new oxygen requirement. Chest x-ray does not show pneumonia.  PAST MEDICAL HISTORY:   Past Medical History  Diagnosis Date  . Hypertension   . Diabetes mellitus ([place])   . GERD (gastroesophageal reflux disease)   . Postpolio syndrome     left leg paralysis  . Depression   . Degenerative disc disease, cervical   . Polio   . Cancer [facility])     hodgkins lymphoma dx 2011 chemo and rad tx finished in 2012    PAST SURGICAL HISTORY:   Past Surgical History  Procedure Laterality Date  . Cholecystectomy    . Rectal prolapse repair  April 2010  . Carpal tunnel release  2004    Bilateral  . Breast lumpectomy    . Bladder surgery    . Abdominal hysterectomy      SOCIAL HISTORY:   Social History  Substance Use Topics  . Smoking status: Current Every Day Smoker -- 1.00 packs/day    Types: Cigarettes  . Smokeless tobacco: No  . Alcohol Use: No    FAMILY HISTORY:   Family History  Problem Relation Age of Onset  . Cancer Father   . Cancer Paternal Grandmother   . Diabetes Mother     DRUG ALLERGIES:   Allergies  Allergen Reactions  .  Prednisone Rash  . Trazodone Rash    REVIEW OF SYSTEMS:   Review of Systems  Constitutional: Negative for fever, chills and malaise/fatigue.  HENT: Negative for ear discharge, ear pain, hearing loss, nosebleeds and sore throat.   Eyes: Negative for blurred vision and pain.  Respiratory: Positive for cough, shortness of breath and wheezing. Negative for hemoptysis.   Cardiovascular: Negative for chest pain, palpitations and leg swelling.  Gastrointestinal: Negative for nausea, vomiting, abdominal pain, diarrhea and blood in stool.  Genitourinary: Negative for dysuria.  Musculoskeletal: Negative for back pain.  Neurological: Negative for dizziness, tremors, speech change, focal weakness, seizures and headaches.  Endo/Heme/Allergies: Does not bruise/bleed easily.  Psychiatric/Behavioral: Negative for depression, suicidal ideas and hallucinations.    MEDICATIONS AT HOME:   Prior to Admission medications   Medication Sig Start Date End Date Taking? Authorizing Provider  albuterol (PROVENTIL HFA;VENTOLIN HFA) 108 (90 Base) MCG/ACT inhaler Inhale 2 puffs into the lungs every 4 (four) hours as needed for wheezing or shortness of breath.   Yes Historical Provider, MD  aspirin EC 81 MG tablet Take 81 mg by mouth daily.   Yes Historical Provider, MD  b complex vitamins tablet Take 1 tablet by mouth daily.   Yes Historical Provider, MD  cholecalciferol (VITAMIN D) 1000 units tablet Take  1,000 Units by mouth daily.   Yes Historical Provider, MD  clonazePAM (KLONOPIN) 0.5 MG tablet Take 0.5 mg by mouth at bedtime.    Yes Historical Provider, MD  divalproex (DEPAKOTE) 250 MG DR tablet Take 250 mg by mouth 2 (two) times daily.    Yes Historical Provider, MD  levothyroxine (SYNTHROID, LEVOTHROID) 50 MCG tablet Take 50 mcg by mouth daily before breakfast.    Yes Historical Provider, MD  losartan (COZAAR) 100 MG tablet Take 100 mg by mouth daily.    Yes Historical Provider, MD  mirabegron ER (MYRBETRIQ)  50 MG TB24 tablet Take 50 mg by mouth at bedtime.    Yes Historical Provider, MD  omega-3 acid ethyl esters (LOVAZA) 1 g capsule Take 1 g by mouth daily.   Yes Historical Provider, MD  PARoxetine (PAXIL) 20 MG tablet Take 20 mg by mouth at bedtime.    Yes Historical Provider, MD  propranolol ER (INDERAL LA) 60 MG 24 hr capsule Take 60 mg by mouth daily.    Yes Historical Provider, MD      VITAL SIGNS:  Blood pressure 147/89, pulse 75, temperature 98.2 F (36.8 C), temperature source Oral, resp. rate 24, height '5\' 2"'$  (1.575 m), weight 54.432 kg (120 lb), SpO2 82 %.  PHYSICAL EXAMINATION:   Physical Exam  Constitutional: She is oriented to person, place, and time and well-developed, well-nourished, and in no distress. No distress.  HENT:  Head: Normocephalic.  Eyes: No scleral icterus.  Neck: Normal range of motion. Neck supple. No JVD present. No tracheal deviation present.  Cardiovascular: Normal rate, regular rhythm and normal heart sounds.  Exam reveals no gallop and no friction rub.   No murmur heard. Pulmonary/Chest: Effort normal. No respiratory distress. She has wheezes. She has no rales. She exhibits no tenderness.  tight  Abdominal: Soft. Bowel sounds are normal. She exhibits no distension and no mass. There is no tenderness. There is no rebound and no guarding.  Musculoskeletal: Normal range of motion. She exhibits no edema.  Left leg is in a brace  Neurological: She is alert and oriented to person, place, and time.  Skin: Skin is warm. No rash noted. No erythema.  Psychiatric: Affect and judgment normal.      LABORATORY PANEL:   CBC  Recent Labs Lab 11/13/15 1346  WBC 14.5*  HGB 17.0*  HCT 49.0*  PLT 241   ------------------------------------------------------------------------------------------------------------------  Chemistries   Recent Labs Lab 11/13/15 1346  NA 137  K 4.2  CL 95*  CO2 30  GLUCOSE 138*  BUN 35*  CREATININE 0.51  CALCIUM 9.9    ------------------------------------------------------------------------------------------------------------------  Cardiac Enzymes No results for input(s): TROPONINI in the last 168 hours. ------------------------------------------------------------------------------------------------------------------  RADIOLOGY:  Dg Chest Port 1 View  11/13/2015  CLINICAL DATA:  Difficulty breathing EXAM: PORTABLE CHEST 1 VIEW COMPARISON:  07/11/2015 FINDINGS: Cardiac shadow is within normal limits. Some minimal left basilar atelectatic changes are seen. No focal confluent infiltrate is noted. No bony abnormality is seen. IMPRESSION: No acute abnormality noted. Electronically Signed   By: Inez Catalina M.D.   On: 11/13/2015 15:00    EKG:   NSR no St elevation  IMPRESSION AND PLAN:   70 year old female with a history of COPD not on oxygen who presents with acute hypoxic respiratory failure.  1. Acute hypoxic respiratory failure: This is due to COPD exacerbation and probable underlying pneumonia not yet seen on chest x-ray.  Patient will be weaned off of oxygen as tolerated.  2. Acute COPD exacerbation: Start IV Solu-Medrol. Patient does have a rash to prednisone. Continue duo nebs and inhalers.  Wean oxygen as tolerated. Start Levaquin.  3. Community-acquired pneumonia: Chest x-ray does not show evidence pneumonia however patient's symptoms are consistent with a pneumonia. Start Levaquin.  4. Hypothyroid: Continue Synthroid  5. Diet-controlled diabetes: Order hemoglobin A1c and continue sliding scale insulin.  6. Depression: Continue Paxil  7. Essential hypertension: Continue propranolol  8. Tobacco dependence: Patient is highly encouraged stop smoking. She is counseled for 3 minutes. She says that she is trying to quit smoking and her daughter is helping with this although her daughter also smokes.  9. Post polio syndrome:  10. Non-Hodgkin's lymphoma in remission All the records are  reviewed and case discussed with ED provider. Management plans discussed with the patient and she in agreement  CODE STATUS: FULL  TOTAL TIME TAKING CARE OF THIS PATIENT: 50 minutes.    Rc Amison M.D on 11/13/2015 at 3:04 PM  Between 7am to 6pm - Pager - [phone]  After 6pm go to [email or web address] - password [email or web address] [facility]  Office  [phone]  CC: Primary care physician; Madelyn Brunner, MD

## 2015-11-13 NOTE — ED Provider Notes (Signed)
Time Seen: Approximately *1415 I have reviewed the triage notes  Chief Complaint: Weakness and Shortness of Breath   History of Present Illness: [name] is a 70 y.o. female who presents with some increased cough and congestion since Tuesday. Patient arrives with a wet cough and her daughter states that she's had previous bronchitis herself. No obvious objective fever at home with generalized weakness. Patient has significant history of previous lymphoma. Patient denies any chest pain and states mostly shortness of breath. She arrives in mild respiratory distress with a pulse ox of 82% on room air she is currently not on any home supplemental oxygen therapy   Past Medical History  Diagnosis Date  . Hypertension   . Diabetes mellitus ([place])   . GERD (gastroesophageal reflux disease)   . Postpolio syndrome     left leg paralysis  . Depression   . Degenerative disc disease, cervical   . Polio   . Cancer [facility])     hodgkins lymphoma dx 2011 chemo and rad tx finished in 2012    Patient Active Problem List   Diagnosis Date Noted  . Lymphoma ([place]) 12/13/2014    Past Surgical History  Procedure Laterality Date  . Cholecystectomy    . Rectal prolapse repair  April 2010  . Carpal tunnel release  2004    Bilateral  . Breast lumpectomy    . Bladder surgery    . Abdominal hysterectomy      Past Surgical History  Procedure Laterality Date  . Cholecystectomy    . Rectal prolapse repair  April 2010  . Carpal tunnel release  2004    Bilateral  . Breast lumpectomy    . Bladder surgery    . Abdominal hysterectomy      Current Outpatient Rx  Name  Route  Sig  Dispense  Refill  . aspirin 81 MG tablet   Oral   Take 81 mg by mouth daily.         . B Complex Vitamins (VITAMIN-B COMPLEX) TABS   Oral   Take 1 tablet by mouth daily.         . cholecalciferol (VITAMIN D) 1000 units tablet   Oral   Take 1,000 Units by mouth daily.         . clonazePAM (KLONOPIN) 0.5 MG  tablet   Oral   Take 1 tablet by mouth at bedtime.         . divalproex (DEPAKOTE) 250 MG DR tablet   Oral   Take 1 tablet by mouth 2 (two) times daily.         . hydrochlorothiazide (HYDRODIURIL) 12.5 MG tablet   Oral   Take 1 tablet by mouth as needed.         Marland Kitchen levothyroxine (SYNTHROID, LEVOTHROID) 50 MCG tablet   Oral   Take 1 tablet by mouth daily.         Marland Kitchen losartan (COZAAR) 100 MG tablet   Oral   Take 1 tablet by mouth daily.         . mirabegron ER (MYRBETRIQ) 50 MG TB24 tablet   Oral   Take 1 tablet by mouth daily.         . OMEGA-3 FATTY ACIDS PO   Oral   Take 1 capsule by mouth daily.         Marland Kitchen PARoxetine (PAXIL) 20 MG tablet   Oral   Take 1 tablet by mouth daily.         Marland Kitchen  propranolol ER (INDERAL LA) 60 MG 24 hr capsule   Oral   Take 1 capsule by mouth daily.           Allergies:  Prednisone and Trazodone  Family History: Family History  Problem Relation Age of Onset  . Cancer Father   . Cancer Paternal Grandmother   . Diabetes Mother     Social History: Social History  Substance Use Topics  . Smoking status: Current Every Day Smoker -- 1.00 packs/day    Types: Cigarettes  . Smokeless tobacco: Not on file  . Alcohol Use: No     Review of Systems:   10 point review of systems was performed and was otherwise negative:  Constitutional: No fever Eyes: No visual disturbances ENT: No sore throat, ear pain Cardiac: No chest pain Respiratory: Increasing shortness of breath no stridor Abdomen: No abdominal pain, no vomiting, No diarrhea Endocrine: No weight loss, No night sweats Extremities: No peripheral edema, cyanosis Skin: No rashes, easy bruising Neurologic: No focal weakness, trouble with speech or swollowing Urologic: No dysuria, Hematuria, or urinary frequency   Physical Exam:  ED Triage Vitals  Enc Vitals Group     BP 11/13/15 1335 147/89 mmHg     Pulse Rate 11/13/15 1335 75     Resp 11/13/15 1335 24      Temp 11/13/15 1335 98.2 F (36.8 C)     Temp Source 11/13/15 1335 Oral     SpO2 11/13/15 1335 82 %     Weight 11/13/15 1335 120 lb (54.432 kg)     Height 11/13/15 1335 '5\' 2"'$  (1.575 m)     Head Cir --      Peak Flow --      Pain Score 11/13/15 1336 0     Pain Loc --      Pain Edu? --      Excl. in [place]? --     General: Awake , Alert , and Oriented times 3; Speaks in brief and interrupted sentences. Is that she has some upper respiratory retractions. Wet sounding cough at the bedside with some appearance of cachexia Head: Normal cephalic , atraumatic Eyes: Pupils equal , round, reactive to light Nose/Throat: No nasal drainage, patent upper airway without erythema or exudate.  Neck: Supple, Full range of motion, No anterior adenopathy or palpable thyroid masses Lungs: Coarse breath sounds auscultated bilaterally with limited air movement. Heart: Regular rate, regular rhythm without murmurs , gallops , or rubs Abdomen: Soft, non tender without rebound, guarding , or rigidity; bowel sounds positive and symmetric in all 4 quadrants. No organomegaly .        Extremities: 2 plus symmetric pulses. No edema, clubbing or cyanosis Neurologic: normal ambulation, Motor symmetric without deficits, sensory intact Skin: warm, dry, no rashes   Labs:   All laboratory work was reviewed including any pertinent negatives or positives listed below:  Labs Reviewed  BASIC METABOLIC PANEL - Abnormal; Notable for the following:    Chloride 95 (*)    Glucose, Bld 138 (*)    BUN 35 (*)    All other components within normal limits  CBC - Abnormal; Notable for the following:    WBC 14.5 (*)    Hemoglobin 17.0 (*)    HCT 49.0 (*)    All other components within normal limits  BRAIN NATRIURETIC PEPTIDE   Patient has an elevated white blood cell count but otherwise laboratory review was within normal limits    Radiology: *  DG  Chest Port 1 View (Final result) Result time: 11/13/15 15:00:25   Final  result by Rad Results In Interface (11/13/15 15:00:25)   Narrative:   CLINICAL DATA: Difficulty breathing  EXAM: PORTABLE CHEST 1 VIEW  COMPARISON: 07/11/2015  FINDINGS: Cardiac shadow is within normal limits. Some minimal left basilar atelectatic changes are seen. No focal confluent infiltrate is noted. No bony abnormality is seen.  IMPRESSION: No acute abnormality noted.   Electronically Signed By: Inez Catalina M.D. On: 11/13/2015 15:00       I personally reviewed the radiologic studies   CRITICAL CARE Performed by: Daymon Larsen   Total critical care time: 31 minutes  Critical care time was exclusive of separately billable procedures and treating other patients.  Critical care was necessary to treat or prevent imminent or life-threatening deterioration.  Critical care was time spent personally by me on the following activities: development of treatment plan with patient and/or surrogate as well as nursing, discussions with consultants, evaluation of patient's response to treatment, examination of patient, obtaining history from patient or surrogate, ordering and performing treatments and interventions, ordering and review of laboratory studies, ordering and review of radiographic studies, pulse oximetry and re-evaluation of patient's condition. Critical care for respiratory distress    ED Course:  Patient arrives in respiratory distress with hypoxia and was placed on supplemental oxygen therapy initiated on DuoNeb therapy. Chest x-ray does not appear to show any signs of infiltrate at this time and based on her clinical presentation and objective findings appears that she has acute bronchitis with bronchospasm. Patient arrives hypoxic.    Assessment: Acute bronchitis with bronchospasm      Plan: Patient was seen shortly after my evaluation by the hospitalist staff for inpatient management            Daymon Larsen, MD 11/13/15 1521

## 2015-11-13 NOTE — ED Notes (Signed)
Patient presents to the ED with cold/congestion since Tuesday, tripoding, and having difficulty breathing.  Patient's cough sounds congested.  Patient reports increasing weakness and lack of appetite for several days.

## 2015-11-14 LAB — BASIC METABOLIC PANEL
ANION GAP: 6 (ref 5–15)
BUN: 33 mg/dL — ABNORMAL HIGH (ref 6–20)
CO2: 33 mmol/L — ABNORMAL HIGH (ref 22–32)
Calcium: 9.2 mg/dL (ref 8.9–10.3)
Chloride: 100 mmol/L — ABNORMAL LOW (ref 101–111)
Creatinine, Ser: 0.57 mg/dL (ref 0.44–1.00)
GFR calc Af Amer: >60 mL/min (ref >60)
Glucose, Bld: 152 mg/dL — ABNORMAL HIGH (ref 65–99)
POTASSIUM: 4 mmol/L (ref 3.5–5.1)
SODIUM: 139 mmol/L (ref 135–145)

## 2015-11-14 LAB — CBC
HEMATOCRIT: 44.8 % (ref 35.0–47.0)
HEMOGLOBIN: 15 g/dL (ref 12.0–16.0)
MCH: 33.4 pg (ref 26.0–34.0)
MCHC: 33.4 g/dL (ref 32.0–36.0)
MCV: 99.9 fL (ref 80.0–100.0)
Platelets: 177 10*3/uL (ref 150–440)
RBC: 4.49 MIL/uL (ref 3.80–5.20)
RDW: 13.9 % (ref 11.5–14.5)
WBC: 10 10*3/uL (ref 3.6–11.0)

## 2015-11-14 LAB — GLUCOSE, CAPILLARY
GLUCOSE-CAPILLARY: 133 mg/dL — AB (ref 65–99)
GLUCOSE-CAPILLARY: 248 mg/dL — AB (ref 65–99)
Glucose-Capillary: 128 mg/dL — ABNORMAL HIGH (ref 65–99)

## 2015-11-14 MED ORDER — ENSURE ENLIVE PO LIQD
237.0000 mL | Freq: Three times a day (TID) | ORAL | Status: DC
  Administered 2015-11-14 – 2015-11-20 (×14): 237 mL via ORAL

## 2015-11-14 MED ORDER — LEVOFLOXACIN 750 MG PO TABS: 750.0000 mg | ORAL_TABLET | ORAL | Status: DC

## 2015-11-14 MED ORDER — METHYLPREDNISOLONE SODIUM SUCC 125 MG IJ SOLR
60.0000 mg | Freq: Two times a day (BID) | INTRAMUSCULAR | Status: DC
  Administered 2015-11-14 – 2015-11-15 (×2): 60 mg via INTRAVENOUS
  Filled 2015-11-14 (×2): qty 2

## 2015-11-14 MED ORDER — BENZONATATE 100 MG PO CAPS
ORAL_CAPSULE | ORAL | Status: AC
  Filled 2015-11-14: qty 1

## 2015-11-14 MED ORDER — AZITHROMYCIN 250 MG PO TABS
500.0000 mg | ORAL_TABLET | Freq: Every day | ORAL | Status: DC
  Administered 2015-11-14 – 2015-11-20 (×7): 500 mg via ORAL
  Filled 2015-11-14 (×7): qty 2

## 2015-11-14 MED ORDER — BENZONATATE 100 MG PO CAPS
100.0000 mg | ORAL_CAPSULE | Freq: Three times a day (TID) | ORAL | Status: DC | PRN
  Administered 2015-11-14 – 2015-11-20 (×11): 100 mg via ORAL
  Filled 2015-11-14 (×9): qty 1

## 2015-11-14 NOTE — Progress Notes (Signed)
[facility] [place] NAME: [name]    MR#:  [id number]  DATE OF BIRTH:  22-Nov-1945  SUBJECTIVE:  CHIEF COMPLAINT:   Chief Complaint  Patient presents with  . Weakness  . Shortness of Breath   Continues to have shortness of breath and wheezing. Yellow sputum. Afebrile. On 2 L oxygen. Feels weak.  REVIEW OF SYSTEMS:    Review of Systems  Constitutional: Positive for malaise/fatigue. Negative for fever and chills.  HENT: Negative for sore throat.   Eyes: Negative for blurred vision, double vision and pain.  Respiratory: Positive for cough, sputum production, shortness of breath and wheezing. Negative for hemoptysis.   Cardiovascular: Negative for chest pain, palpitations, orthopnea and leg swelling.  Gastrointestinal: Negative for heartburn, nausea, vomiting, abdominal pain, diarrhea and constipation.  Genitourinary: Negative for dysuria and hematuria.  Musculoskeletal: Negative for back pain and joint pain.  Skin: Negative for rash.  Neurological: Positive for weakness. Negative for sensory change, speech change, focal weakness and headaches.  Endo/Heme/Allergies: Does not bruise/bleed easily.  Psychiatric/Behavioral: Negative for depression. The patient is not nervous/anxious.     DRUG ALLERGIES:   Allergies  Allergen Reactions  . Prednisone Rash  . Trazodone Rash    VITALS:  Blood pressure 120/83, pulse 63, temperature 98.7 F (37.1 C), temperature source Oral, resp. rate 17, height '5\' 2"'$  (1.575 m), weight 54.432 kg (120 lb), SpO2 91 %.  PHYSICAL EXAMINATION:   Physical Exam  GENERAL:  70 y.o.-year-old patient lying in the bed with Respiratory distress EYES: Pupils equal, round, reactive to light and accommodation. No scleral icterus. Extraocular muscles intact.  HEENT: Head atraumatic, normocephalic. Oropharynx and nasopharynx clear.  NECK:  Supple, no jugular venous distention. No thyroid enlargement, no  tenderness.  LUNGS: Increased work of breathing. Conversational dyspnea. Bilateral wheezing. Decreased air entry. CARDIOVASCULAR: S1, S2 normal. No murmurs, rubs, or gallops.  ABDOMEN: Soft, nontender, nondistended. Bowel sounds present. No organomegaly or mass.  EXTREMITIES: No cyanosis, clubbing or edema b/l.  Bilateral lower extremity muscle wasting  NEUROLOGIC: Cranial nerves II through XII are intact. Motor strength 4/5 in lower extremities. 5/5 in upper extremity's PSYCHIATRIC: The patient is alert and oriented x 3.  SKIN: No obvious rash, lesion, or ulcer.   LABORATORY PANEL:   CBC  Recent Labs Lab 11/14/15 0313  WBC 10.0  HGB 15.0  HCT 44.8  PLT 177   ------------------------------------------------------------------------------------------------------------------ Chemistries   Recent Labs Lab 11/14/15 0313  NA 139  K 4.0  CL 100*  CO2 33*  GLUCOSE 152*  BUN 33*  CREATININE 0.57  CALCIUM 9.2   ------------------------------------------------------------------------------------------------------------------  Cardiac Enzymes No results for input(s): TROPONINI in the last 168 hours. ------------------------------------------------------------------------------------------------------------------  RADIOLOGY:  Dg Chest Port 1 View  11/13/2015  CLINICAL DATA:  Difficulty breathing EXAM: PORTABLE CHEST 1 VIEW COMPARISON:  07/11/2015 FINDINGS: Cardiac shadow is within normal limits. Some minimal left basilar atelectatic changes are seen. No focal confluent infiltrate is noted. No bony abnormality is seen. IMPRESSION: No acute abnormality noted. Electronically Signed   By: Inez Catalina M.D.   On: 11/13/2015 15:00     ASSESSMENT AND PLAN:   70 year old female with a history of COPD not on oxygen who presents with acute hypoxic respiratory failure.  * COPD exacerbation with acute hypoxic respiratory failure -IV steroids, Antibiotics - Scheduled Nebulizers -  Inhalers -Wean O2 as tolerated - Consult pulmonary if no improvement Chest x-ray shows no pneumonia  *. Hypothyroid Synthroid  *  Diet-controlled diabetes: Sliding Scale insulin Blood sugars will likely be uncontrolled due to steroids.  * Depression:  On Paxil  * Essential hypertension Continue propranolol  * Tobacco dependence:  Counseled to quit smoking on admission  * Post polio syndrome  * Non-Hodgkin's lymphoma in remission  All the records are reviewed and case discussed with Care Management/Social Workerr. Management plans discussed with the patient, family and they are in agreement.  CODE STATUS: FULL CODE  DVT Prophylaxis: SCDs  TOTAL TIME TAKING CARE OF THIS PATIENT: 35 minutes.   POSSIBLE D/C IN 1-2 DAYS, DEPENDING ON CLINICAL CONDITION.  Hillary Bow R M.D on 11/14/2015 at 10:12 AM  Between 7am to 6pm - Pager - [phone]  After 6pm go to [email or web address] - password [email or web address] [facility]  Office  [phone]  CC: Primary care physician; Madelyn Brunner, MD  Note: This dictation was prepared with Dragon dictation along with smaller phrase technology. Any transcriptional errors that result from this process are unintentional.

## 2015-11-14 NOTE — Progress Notes (Signed)
CONCERNING: Antibiotic IV to Oral Route Change Policy  RECOMMENDATION: This patient is receiving levofloxacin by the intravenous route.  Based on criteria approved by the Pharmacy and Therapeutics Committee, the antibiotic(s) is/are being converted to the equivalent oral dose form(s).   DESCRIPTION: These criteria include:  Patient being treated for a respiratory tract infection, urinary tract infection, cellulitis or clostridium difficile associated diarrhea if on metronidazole  The patient is not neutropenic and does not exhibit a GI malabsorption state  The patient is eating (either orally or via tube) and/or has been taking other orally administered medications for a least 24 hours  The patient is improving clinically and has a Tmax < 100.5  If you have questions about this conversion, please contact the Pharmacy Department  '[]'$   [phone] )  [facility] '[x]'$   [phone] )  [facility] '[]'$   [phone] )  [facility] '[]'$   [phone] )  [facility] '[]'$   [phone] )  [facility]

## 2015-11-14 NOTE — Evaluation (Signed)
Physical Therapy Evaluation Patient Details Name: KHRISTI SCHILLER MRN: [id number] DOB: 1945/11/26 Today's Date: 11/14/2015   History of Present Illness  Patient is a 70 y/o female presenting for increased shortness of breath, cough, low grade fevers. 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   Clinical Impression  Patient is a pleasant 70 y/o female acutely deconditioned from COPD exacerbation. She has post-polio syndrome and requires brace on LLE for mobility, she has typically been ambulating without device prior to this admission. Patient begins coughing with any mobility, she reports chronic neck pain when she begins any mobility. Patient is acutely deconditioned and demonstrates increased HR and decreased O2 sats after ambulation. She reports feeling crummy after the session and is not up for any further mobility. Would recommend SNF after discharge to allow patient to increase her tolerance for mobility prior to return home.     Follow Up Recommendations SNF    Equipment Recommendations  Rolling walker with 5" wheels    Recommendations for Other Services       Precautions / Restrictions Precautions Precautions: Fall Required Braces or Orthoses: Knee Immobilizer - Left (Chronic L knee brace for post-polio ) Knee Immobilizer - Left: On at all times Restrictions Weight Bearing Restrictions: No      Mobility  Bed Mobility Overal bed mobility: Modified Independent             General bed mobility comments: Delayed/prolonged time to complete as she begins coughing.   Transfers Overall transfer level: Needs assistance Equipment used: None Transfers: Sit to/from Stand Sit to Stand: Supervision         General transfer comment: Patient is able to come to standing independently, however requires holding on to bed rails to maintain balance.   Ambulation/Gait Ambulation/Gait assistance: Min guard Ambulation  Distance (Feet): 3 Feet Assistive device: None Gait Pattern/deviations: Decreased step length - left;Decreased stance time - left   Gait velocity interpretation: Below normal speed for age/gender General Gait Details: Patient takes 3-4 side steps, decreased stance time and increased side flexion through trunk when stepping to the L. Holds on to bed rail/chair rail throughout for support.   Stairs            Wheelchair Mobility    Modified Rankin (Stroke Patients Only)       Balance Overall balance assessment: Needs assistance Sitting-balance support: Bilateral upper extremity supported Sitting balance-Leahy Scale: Fair   Postural control: Posterior lean Standing balance support: Single extremity supported Standing balance-Leahy Scale: Fair                               Pertinent Vitals/Pain Pain Assessment:  (Chronic pain in her posterior neck)    Home Living Family/patient expects to be discharged to:: Private residence Living Arrangements: Children Available Help at Discharge: Family Type of Home: House Home Access: Stairs to enter   Technical brewer of Steps: 1-2 Home Layout: One level Home Equipment: Walker - 2 wheels      Prior Function Level of Independence: Independent         Comments: Patient reports ambulating without AD and L leg brace for short distances prior to this admission.      Hand Dominance        Extremity/Trunk Assessment               Lower Extremity Assessment: LLE deficits/detail  LLE Deficits / Details: Maintained in lockout brace, able to bear weight and sidestep, though appears weaker than baseline as she reaches for assistance while WBing on LLE.      Communication   Communication: No difficulties  Cognition Arousal/Alertness: Awake/alert Behavior During Therapy: WFL for tasks assessed/performed Overall Cognitive Status: Within Functional Limits for tasks assessed                       General Comments General comments (skin integrity, edema, etc.): Generalized tremors/shaking likely from breathing treatments.     Exercises        Assessment/Plan    PT Assessment Patient needs continued PT services  PT Diagnosis Difficulty walking;Generalized weakness   PT Problem List Decreased strength;Decreased knowledge of use of DME;Decreased activity tolerance;Decreased balance;Decreased mobility;Cardiopulmonary status limiting activity;Decreased safety awareness  PT Treatment Interventions DME instruction;Gait training;Stair training;Therapeutic activities;Therapeutic exercise;Balance training   PT Goals (Current goals can be found in the Care Plan section) Acute Rehab PT Goals Patient Stated Goal: To get stronger and feel better  PT Goal Formulation: With patient/family Time For Goal Achievement: 11/28/15 Potential to Achieve Goals: Fair    Frequency Min 2X/week   Barriers to discharge        Co-evaluation               End of Session Equipment Utilized During Treatment: Gait belt;Left knee immobilizer;Oxygen Activity Tolerance: Patient tolerated treatment well;Patient limited by fatigue Patient left: in chair;with call bell/phone within reach;with chair alarm set;with family/visitor present Nurse Communication: Mobility status    Functional Assessment Tool Used: Clinical judgement Functional Limitation: Mobility: Walking and moving around Mobility: Walking and Moving Around Current Status (K1601): At least 40 percent but less than 60 percent impaired, limited or restricted Mobility: Walking and Moving Around Goal Status [phone]): At least 20 percent but less than 40 percent impaired, limited or restricted    Time: 1207-1225 PT Time Calculation (min) (ACUTE ONLY): 18 min   Charges:   PT Evaluation $PT Eval Moderate Complexity: 1 Procedure     PT G Codes:   PT G-Codes **NOT FOR INPATIENT CLASS** Functional Assessment Tool Used: Clinical  judgement Functional Limitation: Mobility: Walking and moving around Mobility: Walking and Moving Around Current Status (F5732): At least 40 percent but less than 60 percent impaired, limited or restricted Mobility: Walking and Moving Around Goal Status [phone]): At least 20 percent but less than 40 percent impaired, limited or restricted   Kerman Passey, PT, DPT    11/14/2015, 1:07 PM

## 2015-11-14 NOTE — Progress Notes (Addendum)
Per patient's grandson, patient "lives at home with her daughter but might as well live alone as far as having any help"

## 2015-11-15 ENCOUNTER — Inpatient Hospital Stay: Payer: Medicare Other

## 2015-11-15 MED ORDER — BENZONATATE 100 MG PO CAPS
ORAL_CAPSULE | ORAL | Status: AC
Start: 2015-11-15 — End: 2015-11-15
  Filled 2015-11-15: qty 1

## 2015-11-15 MED ORDER — METHYLPREDNISOLONE SODIUM SUCC 125 MG IJ SOLR
60.0000 mg | Freq: Every day | INTRAMUSCULAR | Status: DC
  Administered 2015-11-16: 09:00:00 60 mg via INTRAVENOUS
  Filled 2015-11-15: qty 2

## 2015-11-15 NOTE — Care Management Important Message (Signed)
Important Message  Patient Details  Name: FRANCA STAKES MRN: [id number] Date of Birth: 05/12/1946   Medicare Important Message Given:  Yes    Shelbie Ammons, RN 11/15/2015, 11:48 AM

## 2015-11-15 NOTE — Progress Notes (Signed)
Initial Nutrition Assessment  DOCUMENTATION CODES:   Non-severe (moderate) malnutrition in context of chronic illness  INTERVENTION:   Cater to pt preferences on Regular diet order. Encouraged po intake between meals as well.  Will order 2pm and 8pm snack.  Agree with Ensure Enlive po TID, each supplement provides 350 kcal and 20 grams of protein and will add Magic Cup as 2pm snack.    NUTRITION DIAGNOSIS:   Malnutrition related to chronic illness as evidenced by energy intake < or equal to 75% for > or equal to 1 month, percent weight loss, moderate depletions of muscle mass, mild depletion of body fat.  GOAL:   Patient will meet greater than or equal to 90% of their needs  MONITOR:   PO intake, Supplement acceptance, Weight trends, Labs, I & O's  REASON FOR ASSESSMENT:   Malnutrition Screening Tool    ASSESSMENT:   Pt admitted with COPD exacerbation and community acquired pna. Pt with h/o nonhodgkins lymphoma currently in remission.   Past Medical History  Diagnosis Date  . Hypertension   . Diabetes mellitus ([place])   . GERD (gastroesophageal reflux disease)   . Postpolio syndrome     left leg paralysis  . Depression   . Degenerative disc disease, cervical   . Polio   . Cancer [facility])     hodgkins lymphoma dx 2011 chemo and rad tx finished in 2012    Diet Order:  Diet regular Room service appropriate?: Yes; Fluid consistency:: Thin   Pt ate bites of cereal this morning with grapes at bedside. Pt daughter reports pt ate bites of egg salad yesterday and a little yogurt. RD aided pt ordering an omelete on visit.  Pt reports poor appetite for a long time.  Pt reports daughter reports pt eats 'like a bird,' usually 1 meal per day with snacks as able. Pt reports trying to drink a supplement when able. Pt unable to tell writer what snacks she usually eats.  Medications: Solumedrol, Vitamin D, B-complex Vitamin C, Omega-3 Labs: BUN 33, Cre wdl, Glucose  152   Gastrointestinal Profile: Last BM:  11/14/2015   Nutrition-Focused Physical Exam Findings: Nutrition-Focused physical exam completed. Findings are mild fat depletion, moderate muscle depletion, and no edema.     Weight Change: Pt reports weight of 145-150lbs one year ago (20% weight loss in one year)   Skin:  Reviewed, no issues   Height:   Ht Readings from Last 1 Encounters:  11/13/15 '5\' 2"'$  (1.575 m)    Weight:   Wt Readings from Last 1 Encounters:  11/13/15 120 lb (54.432 kg)     BMI:  Body mass index is 21.94 kg/(m^2).  Estimated Nutritional Needs:   Kcal:  1620-1890kcals  Protein:  65-81g protein  Fluid:  >/= 1.7L fluid  EDUCATION NEEDS:   Education needs no appropriate at this time  Dwyane Luo, RD, LDN Pager [phone] Weekend/On-Call Pager [phone]

## 2015-11-15 NOTE — Progress Notes (Signed)
[name] at [place] NAME: [name]    MR#:  [id number]  DATE OF BIRTH:  Nov 20, 1945  SUBJECTIVE:  CHIEF COMPLAINT:   Chief Complaint  Patient presents with  . Weakness  . Shortness of Breath   Continues to have shortness of breath and wheezing. Yellow sputum. Afebrile. On 4 L oxygen Today. Feels weak. Poor appetite. Daughter at bedside.  REVIEW OF SYSTEMS:    Review of Systems  Constitutional: Positive for malaise/fatigue. Negative for fever and chills.  HENT: Negative for sore throat.   Eyes: Negative for blurred vision, double vision and pain.  Respiratory: Positive for cough, sputum production, shortness of breath and wheezing. Negative for hemoptysis.   Cardiovascular: Negative for chest pain, palpitations, orthopnea and leg swelling.  Gastrointestinal: Negative for heartburn, nausea, vomiting, abdominal pain, diarrhea and constipation.  Genitourinary: Negative for dysuria and hematuria.  Musculoskeletal: Negative for back pain and joint pain.  Skin: Negative for rash.  Neurological: Positive for weakness. Negative for sensory change, speech change, focal weakness and headaches.  Endo/Heme/Allergies: Does not bruise/bleed easily.  Psychiatric/Behavioral: Negative for depression. The patient is not nervous/anxious.     DRUG ALLERGIES:   Allergies  Allergen Reactions  . Prednisone Rash  . Trazodone Rash    VITALS:  Blood pressure 133/71, pulse 55, temperature 97.7 F (36.5 C), temperature source Oral, resp. rate 20, height '5\' 2"'$  (1.575 m), weight 54.432 kg (120 lb), SpO2 92 %.  PHYSICAL EXAMINATION:   Physical Exam  GENERAL:  70 y.o.-year-old patient lying in the bed with Respiratory distress EYES: Pupils equal, round, reactive to light and accommodation. No scleral icterus. Extraocular muscles intact.  HEENT: Head atraumatic, normocephalic. Oropharynx and nasopharynx clear.  NECK:  Supple, no jugular venous  distention. No thyroid enlargement, no tenderness.  LUNGS: Increased work of breathing. Conversational dyspnea. Bilateral wheezing. Decreased air entry. CARDIOVASCULAR: S1, S2 normal. No murmurs, rubs, or gallops.  ABDOMEN: Soft, nontender, nondistended. Bowel sounds present. No organomegaly or mass.  EXTREMITIES: No cyanosis, clubbing or edema b/l.  Bilateral lower extremity muscle wasting  NEUROLOGIC: Cranial nerves II through XII are intact. Motor strength 4/5 in lower extremities. 5/5 in upper extremity's PSYCHIATRIC: The patient is alert and oriented x 3.  SKIN: No obvious rash, lesion, or ulcer.   LABORATORY PANEL:   CBC  Recent Labs Lab 11/14/15 0313  WBC 10.0  HGB 15.0  HCT 44.8  PLT 177   ------------------------------------------------------------------------------------------------------------------ Chemistries   Recent Labs Lab 11/14/15 0313  NA 139  K 4.0  CL 100*  CO2 33*  GLUCOSE 152*  BUN 33*  CREATININE 0.57  CALCIUM 9.2   ------------------------------------------------------------------------------------------------------------------  Cardiac Enzymes No results for input(s): TROPONINI in the last 168 hours. ------------------------------------------------------------------------------------------------------------------  RADIOLOGY:  No results found.   ASSESSMENT AND PLAN:   70 year old female with a history of COPD not on oxygen who presents with acute hypoxic respiratory failure.  * COPD exacerbation with acute hypoxic respiratory failure -IV steroids, Antibiotics - Scheduled Nebulizers - Inhalers -Wean O2 as tolerated Chest x-ray shows no pneumonia. Repeat chest x-ray today as patient is on more oxygen today.  *. Hypothyroid Synthroid  * Diet-controlled diabetes: Sliding Scale insulin Blood sugars will likely be uncontrolled due to steroids.  * Depression:  On Paxil  * Essential hypertension Continue propranolol  * Tobacco  dependence:  Counseled to quit smoking on admission  * Post polio syndrome  * Non-Hodgkin's lymphoma in remission  Discussed in detail  with patient and daughter at bedside regarding treatment plan and prognosis.  All the records are reviewed and case discussed with Care Management/Social Workerr. Management plans discussed with the patient, family and they are in agreement.  CODE STATUS: FULL CODE  DVT Prophylaxis: SCDs  TOTAL TIME TAKING CARE OF THIS PATIENT: 40 minutes.   POSSIBLE D/C IN 1-2 DAYS, DEPENDING ON CLINICAL CONDITION. May need skilled nursing facility and oxygen after discharge.  Hillary Bow R M.D on 11/15/2015 at 2:59 PM  Between 7am to 6pm - Pager - [phone]  After 6pm go to [email or web address] - password [email or web address] [place] [facility]  Office  [phone]  CC: Primary care physician; Madelyn Brunner, MD  Note: This dictation was prepared with Dragon dictation along with smaller phrase technology. Any transcriptional errors that result from this process are unintentional.

## 2015-11-15 NOTE — Clinical Social Work Placement (Signed)
   CLINICAL SOCIAL WORK PLACEMENT  NOTE  Date:  11/15/2015  Patient Details  Name: [name] MRN: [id number] Date of Birth: Jul 31, 1945  Clinical Social Work is seeking post-discharge placement for this patient at the   level of care (*CSW will initial, date and re-position this form in  chart as items are completed):  Yes   Patient/family provided with [place] Work Department's list of facilities offering this level of care within the geographic area requested by the patient (or if unable, by the patient's family).  Yes   Patient/family informed of their freedom to choose among providers that offer the needed level of care, that participate in Medicare, Medicaid or managed care program needed by the patient, have an available bed and are willing to accept the patient.  Yes   Patient/family informed of [facility]'s ownership interest in [facility] and [facility], as well as of the fact that they are under no obligation to receive care at these facilities.  PASRR submitted to EDS on 11/15/15     PASRR number received on 11/15/15     Existing PASRR number confirmed on       FL2 transmitted to all facilities in geographic area requested by pt/family on 11/15/15     FL2 transmitted to all facilities within larger geographic area on       Patient informed that his/her managed care company has contracts with or will negotiate with certain facilities, including the following:            Patient/family informed of bed offers received.  Patient chooses bed at       Physician recommends and patient chooses bed at      Patient to be transferred to   on  .  Patient to be transferred to facility by       Patient family notified on   of transfer.  Name of family member notified:        PHYSICIAN       Additional Comment:    _______________________________________________ Darden Dates, LCSW 11/15/2015, 11:53 AM

## 2015-11-15 NOTE — Plan of Care (Signed)
Problem: Discharge Progression Outcomes Goal: O2 sats > or equal 90% or at baseline Outcome: Not Progressing Patient remians on 4L O2 saturations 92%, unable to wean  Goal: Tolerating diet Outcome: Progressing Poor appetite. Dietary consulted, Ensure added between meals. Encouraged patient to order between meal times if she becomes hungry.

## 2015-11-15 NOTE — Clinical Social Work Note (Signed)
Clinical Social Work Assessment  Patient Details  Name: [name] MRN: [id number] Date of Birth: 1945/09/09  Date of referral:  11/15/15               Reason for consult:  Facility Placement                Permission sought to share information with:  Family Supports Permission granted to share information::  Yes, Verbal Permission Granted  Name::     Daughter, [name]   Housing/Transportation Living arrangements for the past 2 months:  [place] of Information:  Patient, Adult Children Patient Interpreter Needed:  None Criminal Activity/Legal Involvement Pertinent to Current Situation/Hospitalization:  No - Comment as needed Significant Relationships:  Adult Children, Other Family Members Lives with:  Adult Children, Friends Do you feel safe going back to the place where you live?  Yes Need for family participation in patient care:  Yes (Comment)  Care giving concerns:  No care giving concerns identified.  Social Worker assessment / plan:  CSW met with pt and daughter to address consult. PT is recommendind STR at SNF. CSW introduced herself and explained role of social work. CSW also explained the process of discharging to SNF with a managed medicare. Pt lives with daughter and a roommate. Pt and daughter are agreeable to SNF placement to build her strength. CSW initiated SNF search and will follow up with bed offers. Pt's daughter's preference is [facility] as it is near by. CSW spoke with admissions coordinator to review referral. CSW will continue to follow.   Employment status:  Retired Nurse, adult PT Recommendations:  [place] / Referral to community resources:  [place]  Patient/Family's Response to care:  Pt and daughter were appreciative of CSW support.   Patient/Family's Understanding of and Emotional Response to Diagnosis, Current Treatment, and Prognosis:  Pt and daughter  understand that pt needs PT in order to return home safely and it is recommended that pt go to a SNF.   Emotional Assessment Appearance:  Appears stated age Attitude/Demeanor/Rapport:  Other (appropriate) Affect (typically observed):  Accepting Orientation:  Oriented to Self, Oriented to Place, Oriented to  Time, Oriented to Situation Alcohol / Substance use:  Never Used Psych involvement (Current and /or in the community):  No (Comment)  Discharge Needs  Concerns to be addressed:  Adjustment to Illness Readmission within the last 30 days:  No Current discharge risk:  Chronically ill Barriers to Discharge:  Continued Medical Work up   [employer], LCSW 11/15/2015, 1:03 PM

## 2015-11-15 NOTE — Progress Notes (Signed)
PT Cancellation Note  Patient Details Name: [name] MRN: [id number] DOB: Dec 23, 1945   Cancelled Treatment:    Reason Eval/Treat Not Completed: Patient declined, no reason specified. Second attempt for treatment this afternoon; pt with nursing assistant and notes she is dealing with a coughing spell. O2 saturation on 4 liters currently mid to high 80's. Pt feeling short of breath and anxious and is noted to have upper extremity and head tremors. Pt does not feel able to participate with PT at this time, but would appreciate attempt tomorrow. Will re attempt at a later date as the schedule allows.    Charlaine Dalton, [place] 11/15/2015, 3:37 PM

## 2015-11-15 NOTE — Progress Notes (Signed)
PT Cancellation Note  Patient Details Name: AMBRY DIX MRN: [id number] DOB: [birth year]   Cancelled Treatment:    Reason Eval/Treat Not Completed: Patient declined, no reason specified (Pt stating feeling "horrible" at present.  Will cont efforts)   Anaalicia Reimann 11/15/2015, 10:34 AM

## 2015-11-15 NOTE — NC FL2 (Signed)
[place] LEVEL OF CARE SCREENING TOOL     IDENTIFICATION  Patient Name: [name] Birthdate: Feb 18, 1946 Sex: female Admission Date (Current Location): 11/13/2015  [place] and [place] Number:  Engineering geologist and Address:  [facility], [address], [place], [place] [address]      Provider Number: [id number]  Attending Physician Name and Address:  Hillary Bow, MD  Relative Name and Phone Number:       Current Level of Care: Hospital Recommended Level of Care: [place] Prior Approval Number:    Date Approved/Denied:   PASRR Number: [id number]  Discharge Plan: SNF    Current Diagnoses: Patient Active Problem List   Diagnosis Date Noted  . COPD exacerbation ([place]) 11/13/2015  . Lymphoma ([place]) 12/13/2014    Orientation RESPIRATION BLADDER Height & Weight     Self, Time, Situation, Place  O2 Continent Weight: 120 lb (54.432 kg) Height:  '5\' 2"'$  (157.5 cm)  BEHAVIORAL SYMPTOMS/MOOD NEUROLOGICAL BOWEL NUTRITION STATUS      Continent Diet (Regular Diet)  AMBULATORY STATUS COMMUNICATION OF NEEDS Skin   Limited Assist Verbally Normal                       Personal Care Assistance Level of Assistance  Bathing, Feeding, Dressing Bathing Assistance: Limited assistance Feeding assistance: Independent Dressing Assistance: Limited assistance     Functional Limitations Info  Sight, Hearing, Speech Sight Info: Adequate Hearing Info: Adequate Speech Info: Adequate    SPECIAL CARE FACTORS FREQUENCY  PT (By licensed PT)     PT Frequency: 5              Contractures      Additional Factors Info  Code Status, Allergies Code Status Info: Full Code Allergies Info: Prednisone, Trazodone           Current Medications (11/15/2015):  This is the current hospital active medication list Current Facility-Administered Medications  Medication Dose Route Frequency Provider Last Rate Last Dose  .  acetaminophen (TYLENOL) tablet 650 mg  650 mg Oral Q6H PRN Bettey Costa, MD       Or  . acetaminophen (TYLENOL) suppository 650 mg  650 mg Rectal Q6H PRN Sital Mody, MD      . albuterol (PROVENTIL) (2.5 MG/3ML) 0.083% nebulizer solution 3 mL  3 mL Inhalation Q4H PRN Bettey Costa, MD      . ALPRAZolam Duanne Moron) tablet 0.25 mg  0.25 mg Oral BID PRN Bettey Costa, MD      . aspirin EC tablet 81 mg  81 mg Oral Daily Bettey Costa, MD   81 mg at 11/15/15 0808  . azithromycin (ZITHROMAX) tablet 500 mg  500 mg Oral Daily Hillary Bow, MD   500 mg at 11/15/15 0808  . B-complex with vitamin C tablet 1 tablet  1 tablet Oral Daily Bettey Costa, MD   1 tablet at 11/15/15 0808  . benzonatate (TESSALON) capsule 100 mg  100 mg Oral TID PRN Quintella Baton, MD   100 mg at 11/15/15 0808  . cholecalciferol (VITAMIN D) tablet 1,000 Units  1,000 Units Oral Daily Bettey Costa, MD   1,000 Units at 11/15/15 0808  . clonazePAM (KLONOPIN) tablet 0.5 mg  0.5 mg Oral QHS Bettey Costa, MD   0.5 mg at 11/14/15 2206  . divalproex (DEPAKOTE) DR tablet 250 mg  250 mg Oral BID Bettey Costa, MD   250 mg at 11/15/15 0808  . enoxaparin (LOVENOX)  injection 40 mg  40 mg Subcutaneous Q24H Bettey Costa, MD   40 mg at 11/14/15 2206  . feeding supplement (ENSURE ENLIVE) (ENSURE ENLIVE) liquid 237 mL  237 mL Oral TID BM Srikar Sudini, MD   237 mL at 11/15/15 1000  . ipratropium-albuterol (DUONEB) 0.5-2.5 (3) MG/3ML nebulizer solution 3 mL  3 mL Nebulization Q4H Sital Mody, MD   3 mL at 11/15/15 0730  . levothyroxine (SYNTHROID, LEVOTHROID) tablet 50 mcg  50 mcg Oral QAC breakfast Bettey Costa, MD   50 mcg at 11/15/15 0808  . losartan (COZAAR) tablet 100 mg  100 mg Oral Daily Bettey Costa, MD   100 mg at 11/15/15 0808  . [START ON 11/16/2015] methylPREDNISolone sodium succinate (SOLU-MEDROL) 125 mg/2 mL injection 60 mg  60 mg Intravenous Daily Srikar Sudini, MD      . mirabegron ER (MYRBETRIQ) tablet 50 mg  50 mg Oral QHS Bettey Costa, MD   50 mg at 11/14/15 2206  .  nicotine (NICODERM CQ - dosed in mg/24 hours) patch 21 mg  21 mg Transdermal Daily Bettey Costa, MD   21 mg at 11/13/15 1759  . omega-3 acid ethyl esters (LOVAZA) capsule 1 g  1 g Oral Daily Bettey Costa, MD   1 g at 11/15/15 0808  . PARoxetine (PAXIL) tablet 20 mg  20 mg Oral QHS Bettey Costa, MD   20 mg at 11/14/15 2206  . propranolol ER (INDERAL LA) 24 hr capsule 60 mg  60 mg Oral Daily Bettey Costa, MD   60 mg at 11/14/15 0804  . senna-docusate (Senokot-S) tablet 1 tablet  1 tablet Oral QHS PRN Bettey Costa, MD         Discharge Medications: Please see discharge summary for a list of discharge medications.  Relevant Imaging Results:  Relevant Lab Results:   Additional Information SSN:  [id number]  Darden Dates, LCSW

## 2015-11-16 DIAGNOSIS — J441 Chronic obstructive pulmonary disease with (acute) exacerbation: Secondary | ICD-10-CM

## 2015-11-16 DIAGNOSIS — E440 Moderate protein-calorie malnutrition: Secondary | ICD-10-CM | POA: Insufficient documentation

## 2015-11-16 LAB — BLOOD GAS, ARTERIAL
ACID-BASE EXCESS: 12.3 mmol/L — AB (ref 0.0–3.0)
Allens test (pass/fail): POSITIVE — AB
BICARBONATE: 41.4 meq/L — AB (ref 21.0–28.0)
FIO2: 0.44
O2 Saturation: 87 %
PATIENT TEMPERATURE: 37
PH ART: 7.38 (ref 7.350–7.450)
pCO2 arterial: 70 mmHg (ref 32.0–48.0)
pO2, Arterial: 54 mmHg — ABNORMAL LOW (ref 83.0–108.0)

## 2015-11-16 MED ORDER — METHYLPREDNISOLONE SODIUM SUCC 40 MG IJ SOLR
40.0000 mg | Freq: Two times a day (BID) | INTRAMUSCULAR | Status: DC
  Administered 2015-11-16 – 2015-11-20 (×8): 40 mg via INTRAVENOUS
  Filled 2015-11-16 (×8): qty 1

## 2015-11-16 MED ORDER — BUDESONIDE 0.5 MG/2ML IN SUSP
0.5000 mg | Freq: Two times a day (BID) | RESPIRATORY_TRACT | Status: DC
  Administered 2015-11-16 – 2015-11-20 (×8): 0.5 mg via RESPIRATORY_TRACT
  Filled 2015-11-16 (×8): qty 2

## 2015-11-16 NOTE — Progress Notes (Signed)
Pt doesn't want to take her breathing treatment. She states that "they make her cough". Pt only wants treatment if her O2 is low. Pt's O2 sat is 95%

## 2015-11-16 NOTE — Progress Notes (Signed)
PT Cancellation Note  Patient Details Name: TAMBERLYN MIDGLEY MRN: [id number] DOB: December 31, 1945   Cancelled Treatment:    Reason Eval/Treat Not Completed: Fatigue/lethargy limiting ability to participate;Other (comment). Treatment attempted this afternoon. Pt sleeping peacefully. Daughter in the room and wishes to allow pt to continue to rest. Daughter notes O2 saturation has decreased to the low 80's several times today despite an increase in the amount of O2 delivered especially with coughing bouts pt experiences. Daughter states she knows pt needs to move; however, this has been difficult due to the coughing spells she has when she tries to move. Daughter also has some concerns regarding family dynamics/controversy with her sister regarding pt's discharge disposition (sister wishes to take pt home) as well as insurance questions. Offered support/understanding of situation as well as some suggestions for discussion with her sister focusing on safety physically and medically for their mother on a day to day basis as well as ability to respond in an emergent situation. Encouraged daughter to discuss insurance questions with primary social worker as well as advised daughter to have family discussion with Education officer, museum. Re attempt treatment tomorrow as tolerated.     Charlaine Dalton, [place] 11/16/2015, 3:03 PM

## 2015-11-16 NOTE — Progress Notes (Signed)
PT Cancellation Note  Patient Details Name: [name] MRN: [id number] DOB: Nov 22, 1945   Cancelled Treatment:    Reason Eval/Treat Not Completed: Other (comment). Treatment attempted this morning. MD in room with pt; will re attempt later today.    Charlaine Dalton, PTA 11/16/2015, 10:39 AM

## 2015-11-16 NOTE — Progress Notes (Addendum)
[facility] [place] NAME: [name]    MR#:  [id number]  DATE OF BIRTH:  Mar 11, 1946  SUBJECTIVE:  CHIEF COMPLAINT:   Chief Complaint  Patient presents with  . Weakness  . Shortness of Breath   Continues to have shortness of breath. Clear sputum. No chest pain. Today she is on 6 L oxygen. She was on 2 L on admission and 4 L yesterday. Poor appetite and feels weak.  REVIEW OF SYSTEMS:    Review of Systems  Constitutional: Positive for malaise/fatigue. Negative for fever and chills.  Respiratory: Positive for cough, sputum production, shortness of breath and wheezing. Negative for hemoptysis.   Cardiovascular: Negative for chest pain, palpitations, orthopnea and leg swelling.  Gastrointestinal: Negative for vomiting and abdominal pain.  Skin: Negative for rash.  Neurological: Positive for weakness.    DRUG ALLERGIES:   Allergies  Allergen Reactions  . Prednisone Rash  . Trazodone Rash    VITALS:  Blood pressure 134/93, pulse 58, temperature 97.7 F (36.5 C), temperature source Oral, resp. rate 20, height '5\' 2"'$  (1.575 m), weight 53.978 kg (119 lb), SpO2 95 %.  PHYSICAL EXAMINATION:   Physical Exam  GENERAL:  70 y.o.-year-old patient lying in the bed with Respiratory distress EYES: Pupils equal, round, reactive to light and accommodation. No scleral icterus. Extraocular muscles intact.  HEENT: Head atraumatic, normocephalic. Oropharynx and nasopharynx clear.  NECK:  Supple, no jugular venous distention. No thyroid enlargement, no tenderness.  LUNGS: Increased work of breathing. Conversational dyspnea. Bilateral wheezing. Decreased air entry. CARDIOVASCULAR: S1, S2 normal. No murmurs, rubs, or gallops.  ABDOMEN: Soft, nontender, nondistended. Bowel sounds present. No organomegaly or mass.  EXTREMITIES: No cyanosis, clubbing or edema b/l.  Bilateral lower extremity muscle wasting  NEUROLOGIC: Cranial nerves II through  XII are intact. Motor strength 4/5 in lower extremities. 5/5 in upper extremity's PSYCHIATRIC: The patient is alert and oriented x 3.  SKIN: No obvious rash, lesion, or ulcer.   LABORATORY PANEL:   CBC  Recent Labs Lab 11/14/15 0313  WBC 10.0  HGB 15.0  HCT 44.8  PLT 177   ------------------------------------------------------------------------------------------------------------------ Chemistries   Recent Labs Lab 11/14/15 0313  NA 139  K 4.0  CL 100*  CO2 33*  GLUCOSE 152*  BUN 33*  CREATININE 0.57  CALCIUM 9.2   ------------------------------------------------------------------------------------------------------------------  Cardiac Enzymes No results for input(s): TROPONINI in the last 168 hours. ------------------------------------------------------------------------------------------------------------------  RADIOLOGY:  Dg Chest 2 View  11/15/2015  CLINICAL DATA:  Productive cough, shortness of breath. EXAM: CHEST  2 VIEW COMPARISON:  Nov 13, 2015. FINDINGS: The heart size and mediastinal contours are within normal limits. Both lungs are clear. No pneumothorax or pleural effusion is noted. The visualized skeletal structures are unremarkable. IMPRESSION: No active cardiopulmonary disease. Electronically Signed   By: Marijo Conception, M.D.   On: 11/15/2015 16:40     ASSESSMENT AND PLAN:   70 year old female with a history of COPD not on oxygen who presents with acute hypoxic respiratory failure.  * COPD exacerbation with acute hypoxic respiratory failure -IV steroids, Antibiotics - Scheduled Nebulizers - Inhalers -Wean O2 as tolerated - Initial and repeat chest x-ray showed no fluid or infiltrate. - We'll consult pulmonary as patient is worsening in spite of adequate treatment for COPD exacerbation. - Check ABG STAT  *. Hypothyroid Synthroid  * Diet-controlled diabetes: Sliding Scale insulin Blood sugars will likely be uncontrolled due to  steroids.  * Depression:  On Paxil  * Essential hypertension Continue propranolol  * Tobacco dependence:  Counseled to quit smoking on admission  * Post polio syndrome  * Non-Hodgkin's lymphoma in remission  All the records are reviewed and case discussed with Care Management/Social Workerr. Management plans discussed with the patient, family and they are in agreement.  CODE STATUS: FULL CODE  DVT Prophylaxis: SCDs  TOTAL CRITICAL CARE TIME TAKING CARE OF THIS PATIENT: 35 minutes.   POSSIBLE D/C IN 2-3 DAYS, DEPENDING ON CLINICAL CONDITION. Will likely need skilled nursing facility and oxygen after discharge.  Hillary Bow R M.D on 11/16/2015 at 11:00 AM  Between 7am to 6pm - Pager - [phone]  After 6pm go to [email or web address] - password [email or web address] [facility]  Office  [phone]  CC: Primary care physician; Madelyn Brunner, MD  Note: This dictation was prepared with Dragon dictation along with smaller phrase technology. Any transcriptional errors that result from this process are unintentional.

## 2015-11-17 DIAGNOSIS — J9622 Acute and chronic respiratory failure with hypercapnia: Secondary | ICD-10-CM

## 2015-11-17 DIAGNOSIS — J9621 Acute and chronic respiratory failure with hypoxia: Secondary | ICD-10-CM

## 2015-11-17 LAB — EXPECTORATED SPUTUM ASSESSMENT W REFEX TO RESP CULTURE

## 2015-11-17 LAB — EXPECTORATED SPUTUM ASSESSMENT W GRAM STAIN, RFLX TO RESP C: Special Requests: NORMAL

## 2015-11-17 MED ORDER — METOPROLOL TARTRATE 25 MG PO TABS
12.5000 mg | ORAL_TABLET | Freq: Two times a day (BID) | ORAL | Status: DC
  Administered 2015-11-17 – 2015-11-20 (×7): 12.5 mg via ORAL
  Filled 2015-11-17 (×7): qty 1

## 2015-11-17 MED ORDER — SENNOSIDES-DOCUSATE SODIUM 8.6-50 MG PO TABS
1.0000 | ORAL_TABLET | Freq: Two times a day (BID) | ORAL | Status: DC
  Administered 2015-11-17 – 2015-11-20 (×6): 1 via ORAL
  Filled 2015-11-17 (×6): qty 1

## 2015-11-17 NOTE — Clinical Social Work Note (Signed)
CSW met with pt to share that [facility] made a bed offer, which was accepted. Pt will discharge when medically stable. CSW will continue to follow.   Darden Dates, MSW, LCSW  Clinical Social Worker  [phone]

## 2015-11-17 NOTE — Care Management Important Message (Signed)
Important Message  Patient Details  Name: [name] MRN: [id number] Date of Birth: Aug 17, 1945   Medicare Important Message Given:  Yes    Shelbie Ammons, RN 11/17/2015, 11:20 AM

## 2015-11-17 NOTE — Progress Notes (Signed)
[name] at [place] NAME: [name]    MR#:  [id number]  DATE OF BIRTH:  11-15-1945  SUBJECTIVE:  CHIEF COMPLAINT:   Chief Complaint  Patient presents with  . Weakness  . Shortness of Breath   Continues to have shortness of breath. Clear sputum. No chest pain. Continues to be on high oxygen. 5-6 L. Still weak. Constipated. Poor appetite.  Family at bedside.  REVIEW OF SYSTEMS:    Review of Systems  Constitutional: Positive for malaise/fatigue. Negative for fever and chills.  Respiratory: Positive for cough, sputum production, shortness of breath and wheezing. Negative for hemoptysis.   Cardiovascular: Negative for chest pain, palpitations, orthopnea and leg swelling.  Gastrointestinal: Negative for vomiting and abdominal pain.  Skin: Negative for rash.  Neurological: Positive for weakness.    DRUG ALLERGIES:   Allergies  Allergen Reactions  . Prednisone Rash  . Trazodone Rash    VITALS:  Blood pressure 135/85, pulse 70, temperature 97.9 F (36.6 C), temperature source Oral, resp. rate 20, height '5\' 2"'$  (1.575 m), weight 53.524 kg (118 lb), SpO2 93 %.  PHYSICAL EXAMINATION:   Physical Exam  GENERAL:  70 y.o.-year-old patient lying in the bed with Respiratory distress EYES: Pupils equal, round, reactive to light and accommodation. No scleral icterus. Extraocular muscles intact.  HEENT: Head atraumatic, normocephalic. Oropharynx and nasopharynx clear.  NECK:  Supple, no jugular venous distention. No thyroid enlargement, no tenderness.  LUNGS: Increased work of breathing. Conversational dyspnea.Poor air entry. CARDIOVASCULAR: S1, S2 normal. No murmurs, rubs, or gallops.  ABDOMEN: Soft, nontender, nondistended. Bowel sounds present. No organomegaly or mass.  EXTREMITIES: No cyanosis, clubbing or edema b/l.  Bilateral lower extremity muscle wasting  NEUROLOGIC: Cranial nerves II through XII are intact. Motor strength  4/5 in lower extremities. 5/5 in upper extremity's PSYCHIATRIC: The patient is alert and oriented x 3.  SKIN: No obvious rash, lesion, or ulcer.   LABORATORY PANEL:   CBC  Recent Labs Lab 11/14/15 0313  WBC 10.0  HGB 15.0  HCT 44.8  PLT 177   ------------------------------------------------------------------------------------------------------------------ Chemistries   Recent Labs Lab 11/14/15 0313  NA 139  K 4.0  CL 100*  CO2 33*  GLUCOSE 152*  BUN 33*  CREATININE 0.57  CALCIUM 9.2   ------------------------------------------------------------------------------------------------------------------  Cardiac Enzymes No results for input(s): TROPONINI in the last 168 hours. ------------------------------------------------------------------------------------------------------------------  RADIOLOGY:  Dg Chest 2 View  11/15/2015  CLINICAL DATA:  Productive cough, shortness of breath. EXAM: CHEST  2 VIEW COMPARISON:  Nov 13, 2015. FINDINGS: The heart size and mediastinal contours are within normal limits. Both lungs are clear. No pneumothorax or pleural effusion is noted. The visualized skeletal structures are unremarkable. IMPRESSION: No active cardiopulmonary disease. Electronically Signed   By: Marijo Conception, M.D.   On: 11/15/2015 16:40     ASSESSMENT AND PLAN:   70 year old female with a history of COPD not on oxygen who presents with acute hypoxic respiratory failure.  * COPD exacerbation with acute hypoxic respiratory failure -IV steroids, Antibiotics - Scheduled Nebulizers - Inhalers -Wean O2 as tolerated - Initial and repeat chest x-ray showed no fluid or infiltrate. -  Appreciate pulmonary help - Can use high flow nasal cannula if any worsening. Still unstable.  *. Hypothyroid Synthroid  * Diet-controlled diabetes: Sliding Scale insulin Blood sugars will likely be uncontrolled due to steroids.  * Depression:  On Paxil  * Essential  hypertension Continue propranolol  * Tobacco dependence:  Counseled to quit smoking on admission  * Post polio syndrome  * Non-Hodgkin's lymphoma in remission  All the records are reviewed and case discussed with Care Management/Social Workerr. Management plans discussed with the patient, family and they are in agreement.  CODE STATUS: FULL CODE  DVT Prophylaxis: SCDs  TOTAL TIME TAKING CARE OF THIS PATIENT: 30 minutes.   POSSIBLE D/C IN 2-3 DAYS, DEPENDING ON CLINICAL CONDITION.  Hillary Bow R M.D on 11/17/2015 at 12:26 PM  Between 7am to 6pm - Pager - [phone]  After 6pm go to [email or web address] - password [email or web address] [facility]  Office  [phone]  CC: Primary care physician; Madelyn Brunner, MD  Note: This dictation was prepared with Dragon dictation along with smaller phrase technology. Any transcriptional errors that result from this process are unintentional.

## 2015-11-17 NOTE — Progress Notes (Signed)
Little change since yesterday C/O HA. Remains dyspneic @ rest Still with rattling cough  Filed Vitals:   11/17/15 0416 11/17/15 0440 11/17/15 0500 11/17/15 0800  BP:  135/85    Pulse:  70    Temp:  97.9 F (36.6 C)    TempSrc:  Oral    Resp:      Height:      Weight:   118 lb (53.524 kg)   SpO2: 92% 93%  93%   Very frail appearing No overt dyspnea @ rest Rattling cough HEENT WNL No JVD Diminished BS, scattered rhonchi, scattered coarse wheezes RRR s M NABS, Soft No edema   BMP Latest Ref Rng 11/14/2015 11/13/2015 06/30/2015  Glucose 65 - 99 mg/dL 152(H) 138(H) -  BUN 6 - 20 mg/dL 33(H) 35(H) -  Creatinine 0.44 - 1.00 mg/dL 0.57 0.51 0.48  Sodium 135 - 145 mmol/L 139 137 -  Potassium 3.5 - 5.1 mmol/L 4.0 4.2 -  Chloride 101 - 111 mmol/L 100(L) 95(L) -  CO2 22 - 32 mmol/L 33(H) 30 -  Calcium 8.9 - 10.3 mg/dL 9.2 9.9 -    CBC Latest Ref Rng 11/14/2015 11/13/2015 06/30/2015  WBC 3.6 - 11.0 K/uL 10.0 14.5(H) 5.2  Hemoglobin 12.0 - 16.0 g/dL 15.0 17.0(H) 15.8  Hematocrit 35.0 - 47.0 % 44.8 49.0(H) 47.0  Platelets 150 - 440 K/uL 177 241 180    No new CXR  IMPRESSION: Smoker COPD - suspect severe @ baseline AECOPD Acute on chronic hypercarbic and hypoxemic respiratory failure  I note that she is on a nonselective beta blocker which might be contributing to bronchospasm  PLAN/REC: Cont supplemental O2 Cont systemic steroids @ current dose Cont nebulized steroids and bronchodilators Cont Azithromycin Change propranolol to metoprolol (a cardioselective beta blocker)  Dr Ashby Dawes on for WE  Merton Border, MD PCCM service Mobile [phone] Pager [phone] 11/17/2015

## 2015-11-17 NOTE — Progress Notes (Signed)
Physical Therapy Treatment Patient Details Name: [name] MRN: [id number] DOB: 09/23/1945 Today's Date: 11/17/2015    History of Present Illness Patient is a 70 y/o female presenting for increased shortness of breath, cough, low grade fevers. 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     PT Comments    Pt initially hesitant to do much but does agree as she knows it is good for her despite how she feels.  Pt on 5 liters O2 with sats in the high 80s low 90s getting to EOB and dropping in the high 70s after ambulation.  She showed good effort and good relative safety but is fully reliant on walker/R LE for mobility (walked w/o brace as she does at night at home).  She is used to crutches, but the stability of the walker was discussed and agreed upon.  Pt very tired after the effort and is able to participate with R LE exercises but after a few needs to lay back down and rest.  Pt shows good effort despite fatigue and generally not feeling like she can do a lot.   Follow Up Recommendations  SNF     Equipment Recommendations  Rolling walker with 5" wheels    Recommendations for Other Services       Precautions / Restrictions Precautions Precautions: Fall Knee Immobilizer - Left:  (pt does not use brace at night at home, when she uses AD) Restrictions Weight Bearing Restrictions: No    Mobility  Bed Mobility Overal bed mobility: Modified Independent             General bed mobility comments: Pt fatigued with just getting to EOB, but able to do so w/o direct assist  Transfers Overall transfer level: Needs assistance Equipment used: Rolling walker (2 wheeled) Transfers: Sit to/from Stand Sit to Stand: Min guard         General transfer comment: Pt reports being anxious about the stability of walker.  Demonstrated it's stability and proper use and adjusted it to appropriate height and pt was agreeable to  try.  Ambulation/Gait Ambulation/Gait assistance: Min guard;Min assist Ambulation Distance (Feet): 12 Feet Assistive device: Rolling walker (2 wheeled)       General Gait Details: Pt with hop -to strategy with R LE (L does not hit the ground).  She clearly needs to use a lot of force through her UEs and has some fatigue but she is able to remain safe and does relatively well.    Stairs            Wheelchair Mobility    Modified Rankin (Stroke Patients Only)       Balance                                    Cognition Arousal/Alertness: Awake/alert Behavior During Therapy: WFL for tasks assessed/performed Overall Cognitive Status: Within Functional Limits for tasks assessed                      Exercises General Exercises - Lower Extremity Ankle Circles/Pumps: 10 reps;Strengthening;Both Long Arc Quad: Right;Strengthening;10 reps Hip ABduction/ADduction: AROM;10 reps;Right Hip Flexion/Marching: 10 reps;Strengthening;AROM;Right    General Comments        Pertinent Vitals/Pain Pain Assessment: No/denies pain    Home Living  Prior Function            PT Goals (current goals can now be found in the care plan section) Progress towards PT goals: Progressing toward goals    Frequency  Min 2X/week    PT Plan Current plan remains appropriate    Co-evaluation             End of Session Equipment Utilized During Treatment: Gait belt;Oxygen (5 liters) Activity Tolerance: Patient limited by fatigue Patient left: with bed alarm set;with call bell/phone within reach;with family/visitor present     Time: 1703-1730 PT Time Calculation (min) (ACUTE ONLY): 27 min  Charges:  $Gait Training: 8-22 mins $Therapeutic Exercise: 8-22 mins                    G Codes:      Kreg Shropshire, DPT 11/17/2015, 5:45 PM

## 2015-11-17 NOTE — Consult Note (Signed)
[facility] Consultation     Date: 11/17/2015,   MRN# [id number] [name] [birth year] Code Status:     Code Status Orders        Start     Ordered   11/13/15 1556  Full code   Continuous     11/13/15 1555    Code Status History    Date Active Date Inactive Code Status Order ID Comments User Context   This patient has a current code status but no historical code status.     Hosp day:'@LENGTHOFSTAYDAYS'$ @ Referring MD: '@ATDPROV'$ @     PCP:      AdmissionWeight: 120 lb (54.432 kg)                 CurrentWeight: 118 lb (53.524 kg) [name] is a 70 y.o. old female seen in consultation for COPD at the request of Dr. Darvin Neighbours     CHIEF COMPLAINT:   Acute resp distress   HISTORY OF PRESENT ILLNESS      MEDICATIONS  70 yo white female seen for acute COPD exacerbation also known history of Post polio syndrome, Diabetes and COPD who is not on oxygen who presents SOB adn increased wheezing, active smoker. Patient reports over the past 2-3 days she's had increasing shortness of breath, cough, low-grade fevers and yellow sputum. She also says for the past day she's had significant wheezing. She was brought to the ER where she will was having tachypnea and evidence of tripoding. She has been given nebulizer treatments and continues to have wheezing and also has a new oxygen requirement. She was place don high flow [place] and transitioned to 5 L [place]   Home Medication:  No current outpatient prescriptions on file.  Current Medication:   Current facility-administered medications:  .  acetaminophen (TYLENOL) tablet 650 mg, 650 mg, Oral, Q6H PRN **OR** acetaminophen (TYLENOL) suppository 650 mg, 650 mg, Rectal, Q6H PRN, Bettey Costa, MD .  ALPRAZolam (XANAX) tablet 0.25 mg, 0.25 mg, Oral, BID PRN, Bettey Costa, MD .  aspirin EC tablet 81 mg, 81 mg, Oral, Daily, Bettey Costa, MD, 81 mg at 11/16/15 0837 .  azithromycin (ZITHROMAX) tablet 500 mg, 500 mg, Oral, Daily, Hillary Bow, MD, 500 mg at 11/16/15 0837 .  B-complex with vitamin C tablet 1 tablet, 1 tablet, Oral, Daily, Bettey Costa, MD, 1 tablet at 11/16/15 0837 .  benzonatate (TESSALON) capsule 100 mg, 100 mg, Oral, TID PRN, Quintella Baton, MD, 100 mg at 11/16/15 1715 .  budesonide (PULMICORT) nebulizer solution 0.5 mg, 0.5 mg, Nebulization, BID, Flora Lipps, MD, 0.5 mg at 11/17/15 0800 .  cholecalciferol (VITAMIN D) tablet 1,000 Units, 1,000 Units, Oral, Daily, Bettey Costa, MD, 1,000 Units at 11/16/15 0836 .  clonazePAM (KLONOPIN) tablet 0.5 mg, 0.5 mg, Oral, QHS, Sital Mody, MD, 0.5 mg at 11/16/15 2221 .  divalproex (DEPAKOTE) DR tablet 250 mg, 250 mg, Oral, BID, Bettey Costa, MD, 250 mg at 11/16/15 2220 .  enoxaparin (LOVENOX) injection 40 mg, 40 mg, Subcutaneous, Q24H, Sital Mody, MD, 40 mg at 11/16/15 2222 .  feeding supplement (ENSURE ENLIVE) (ENSURE ENLIVE) liquid 237 mL, 237 mL, Oral, TID BM, Srikar Sudini, MD, 237 mL at 11/16/15 2000 .  ipratropium-albuterol (DUONEB) 0.5-2.5 (3) MG/3ML nebulizer solution 3 mL, 3 mL, Nebulization, Q4H, Sital Mody, MD, 3 mL at 11/17/15 0800 .  levothyroxine (SYNTHROID, LEVOTHROID) tablet 50 mcg, 50 mcg, Oral, QAC breakfast, Bettey Costa, MD, 50 mcg at 11/16/15 0837 .  losartan (COZAAR)  tablet 100 mg, 100 mg, Oral, Daily, Bettey Costa, MD, 100 mg at 11/16/15 0836 .  methylPREDNISolone sodium succinate (SOLU-MEDROL) 40 mg/mL injection 40 mg, 40 mg, Intravenous, Q12H, Flora Lipps, MD, 40 mg at 11/16/15 2220 .  mirabegron ER (MYRBETRIQ) tablet 50 mg, 50 mg, Oral, QHS, Bettey Costa, MD, 50 mg at 11/16/15 2221 .  nicotine (NICODERM CQ - dosed in mg/24 hours) patch 21 mg, 21 mg, Transdermal, Daily, Bettey Costa, MD, 21 mg at 11/16/15 7353 .  omega-3 acid ethyl esters (LOVAZA) capsule 1 g, 1 g, Oral, Daily, Bettey Costa, MD, 1 g at 11/16/15 0837 .  PARoxetine (PAXIL) tablet 20 mg, 20 mg, Oral, QHS, Sital Mody, MD, 20 mg at 11/16/15 2220 .  propranolol ER (INDERAL LA) 24 hr capsule 60 mg, 60 mg,  Oral, Daily, Bettey Costa, MD, 60 mg at 11/16/15 0837 .  senna-docusate (Senokot-S) tablet 1 tablet, 1 tablet, Oral, QHS PRN, Bettey Costa, MD     ALLERGIES   Prednisone and Trazodone     REVIEW OF SYSTEMS   Review of Systems  Constitutional: Positive for fever, chills and malaise/fatigue. Negative for weight loss and diaphoresis.  HENT: Positive for congestion.   Eyes: Negative for blurred vision.  Respiratory: Positive for cough, shortness of breath and wheezing.   Cardiovascular: Negative for chest pain and leg swelling.  Gastrointestinal: Negative for heartburn and nausea.  Musculoskeletal: Negative for myalgias.  Skin: Negative for rash.  Neurological: Positive for weakness. Negative for dizziness and headaches.  Psychiatric/Behavioral: The patient is not nervous/anxious.   All other systems reviewed and are negative.    VS: BP 135/85 mmHg  Pulse 70  Temp(Src) 97.9 F (36.6 C) (Oral)  Resp 20  Ht '5\' 2"'$  (1.575 m)  Wt 118 lb (53.524 kg)  BMI 21.58 kg/m2  SpO2 93%  LMP  (Approximate)     PHYSICAL EXAM   Physical Exam  Constitutional: She is oriented to person, place, and time. She appears well-developed and well-nourished. No distress.  HENT:  Head: Normocephalic and atraumatic.  Mouth/Throat: No oropharyngeal exudate.  Eyes: No scleral icterus.  Cardiovascular: Normal rate, regular rhythm and normal heart sounds.   No murmur heard. Pulmonary/Chest: No stridor. No respiratory distress. She has wheezes.  Abdominal: Soft. Bowel sounds are normal.  Musculoskeletal: Normal range of motion. She exhibits no edema.  Neurological: She is alert and oriented to person, place, and time. No cranial nerve deficit.  Skin: Skin is warm. She is diaphoretic.  Psychiatric: She has a normal mood and affect.         ASSESSMENT/PLAN   70 yo white female with extensive smoking history quit 2 days ago admitted fro acute COPD exacerbation from acute bronchitis.  Based on ABg  results patients pco2 levels may be at  baseline in the upper  60's   1.oxygen as needed keep o2 sats 88-92% 2.soluemdrol 40 bid 3.dounebs every 4 hrs 4.pulmicort nebs 5.continue IV abx as prescribed 6.check sputum cultures 7.  I have personally obtained a history, examined the patient, evaluated laboratory and independently reviewed imaging results, formulated the assessment and plan and placed orders.  The Patient requires high complexity decision making for assessment and support, frequent evaluation and titration of therapies, application of advanced monitoring technologies and extensive interpretation of multiple databases.      Corrin Parker, M.D.  [facility]  Medical Director [place] Director [facility]

## 2015-11-18 MED ORDER — SALINE SPRAY 0.65 % NA SOLN
1.0000 | NASAL | Status: DC | PRN
  Administered 2015-11-18: 08:00:00 1 via NASAL
  Filled 2015-11-18: qty 44

## 2015-11-18 NOTE — Progress Notes (Signed)
Patient c/o right nare dryness/sorness. PRN order for saline nasal spray entered per standing admission orders.

## 2015-11-18 NOTE — Progress Notes (Signed)
Continues on oxygen at 5 L, breathing feels somewhat better than yesterday. No new complaints.  Filed Vitals:   11/18/15 0500 11/18/15 0512 11/18/15 0747 11/18/15 0810  BP:  139/81  145/94  Pulse:  55  56  Temp:  97.7 F (36.5 C)  98 F (36.7 C)  TempSrc:  Oral  Oral  Resp:  18    Height:      Weight: 117 lb 12.8 oz (53.434 kg)     SpO2:  94% 91% 94%   Very frail appearing No overt dyspnea @ rest HEENT WNL No JVD Diminished BS, scattered rhonchi, scattered coarse wheezesBilaterally.  RRR s M NABS, Soft No edema   BMP Latest Ref Rng 11/14/2015 11/13/2015 06/30/2015  Glucose 65 - 99 mg/dL 152(H) 138(H) -  BUN 6 - 20 mg/dL 33(H) 35(H) -  Creatinine 0.44 - 1.00 mg/dL 0.57 0.51 0.48  Sodium 135 - 145 mmol/L 139 137 -  Potassium 3.5 - 5.1 mmol/L 4.0 4.2 -  Chloride 101 - 111 mmol/L 100(L) 95(L) -  CO2 22 - 32 mmol/L 33(H) 30 -  Calcium 8.9 - 10.3 mg/dL 9.2 9.9 -    CBC Latest Ref Rng 11/14/2015 11/13/2015 06/30/2015  WBC 3.6 - 11.0 K/uL 10.0 14.5(H) 5.2  Hemoglobin 12.0 - 16.0 g/dL 15.0 17.0(H) 15.8  Hematocrit 35.0 - 47.0 % 44.8 49.0(H) 47.0  Platelets 150 - 440 K/uL 177 241 180    No new CXR  IMPRESSION: Smoker COPD - suspect severe @ baseline AECOPD Acute on chronic hypercarbic and hypoxemic respiratory failure   PLAN/REC: Cont supplemental O2 Discussed with the hospitalist, will continue  systemic steroids @ current dose Cont nebulized steroids and bronchodilators Cont Azithromycin   Dr Ashby Dawes M.D. PCCM service Pager [phone] 11/18/2015

## 2015-11-18 NOTE — Progress Notes (Signed)
[place] at [place] NAME: Rexann Lueras    MR#:  [id number]  DATE OF BIRTH:  06-09-1946  SUBJECTIVE:  CHIEF COMPLAINT:   Chief Complaint  Patient presents with  . Weakness  . Shortness of Breath   Continues to have shortness of breath and cough. Feels weak. Appetite improving. Daughter at bedside.  Patient walked with physical therapy yesterday evening. Was able to take a few steps.  On 5 L oxygen.  REVIEW OF SYSTEMS:    Review of Systems  Constitutional: Positive for malaise/fatigue. Negative for fever and chills.  Respiratory: Positive for cough, sputum production, shortness of breath and wheezing. Negative for hemoptysis.   Cardiovascular: Negative for chest pain, palpitations, orthopnea and leg swelling.  Gastrointestinal: Negative for vomiting and abdominal pain.  Skin: Negative for rash.  Neurological: Positive for weakness.    DRUG ALLERGIES:   Allergies  Allergen Reactions  . Prednisone Rash  . Trazodone Rash    VITALS:  Blood pressure 145/94, pulse 56, temperature 98 F (36.7 C), temperature source Oral, resp. rate 18, height '5\' 2"'$  (1.575 m), weight 53.434 kg (117 lb 12.8 oz), SpO2 90 %.  PHYSICAL EXAMINATION:   Physical Exam  GENERAL:  70 y.o.-year-old patient lying in the bed with Respiratory distress EYES: Pupils equal, round, reactive to light and accommodation. No scleral icterus. Extraocular muscles intact.  HEENT: Head atraumatic, normocephalic. Oropharynx and nasopharynx clear.  NECK:  Supple, no jugular venous distention. No thyroid enlargement, no tenderness.  LUNGS: Increased work of breathing. Conversational dyspnea.Poor air entry. CARDIOVASCULAR: S1, S2 normal. No murmurs, rubs, or gallops.  ABDOMEN: Soft, nontender, nondistended. Bowel sounds present. No organomegaly or mass.  EXTREMITIES: No cyanosis, clubbing or edema b/l.  Bilateral lower extremity muscle wasting  NEUROLOGIC: Cranial  nerves II through XII are intact. Motor strength 4/5 in lower extremities. 5/5 in upper extremity's PSYCHIATRIC: The patient is alert and oriented x 3.  SKIN: No obvious rash, lesion, or ulcer.   LABORATORY PANEL:   CBC  Recent Labs Lab 11/14/15 0313  WBC 10.0  HGB 15.0  HCT 44.8  PLT 177   ------------------------------------------------------------------------------------------------------------------ Chemistries   Recent Labs Lab 11/14/15 0313  NA 139  K 4.0  CL 100*  CO2 33*  GLUCOSE 152*  BUN 33*  CREATININE 0.57  CALCIUM 9.2   ------------------------------------------------------------------------------------------------------------------  Cardiac Enzymes No results for input(s): TROPONINI in the last 168 hours. ------------------------------------------------------------------------------------------------------------------  RADIOLOGY:  No results found.   ASSESSMENT AND PLAN:   70 year old female with a history of COPD not on oxygen who presents with acute hypoxic respiratory failure.  * COPD exacerbation with acute hypoxic respiratory failure -IV steroids, Antibiotics - Scheduled Nebulizers - Inhalers -Wean O2 as tolerated - Initial and repeat chest x-ray showed no fluid or infiltrate. - Discussed with Dr. Juanell Fairly of pulmonary.  *. Hypothyroid Synthroid  * Diet-controlled diabetes: Sliding Scale insulin Blood sugars will likely be uncontrolled due to steroids.  * Depression:  On Paxil  * Essential hypertension Continue propranolol  * Tobacco dependence:  Counseled to quit smoking on admission  * Post polio syndrome  * Non-Hodgkin's lymphoma in remission  All the records are reviewed and case discussed with Care Management/Social Workerr. Management plans discussed with the patient, family and they are in agreement.  CODE STATUS: FULL CODE  DVT Prophylaxis: SCDs  TOTAL TIME TAKING CARE OF THIS PATIENT: 30 minutes.   POSSIBLE D/C  IN 2-3 DAYS, DEPENDING ON CLINICAL CONDITION.  Hillary Bow R M.D on 11/18/2015 at 2:06 PM  Between 7am to 6pm - Pager - [phone]  After 6pm go to [email or web address] - password [email or web address] [facility]  Office  [phone]  CC: Primary care physician; Madelyn Brunner, MD  Note: This dictation was prepared with Dragon dictation along with smaller phrase technology. Any transcriptional errors that result from this process are unintentional.

## 2015-11-19 LAB — CREATININE, SERUM
CREATININE: 0.47 mg/dL (ref 0.44–1.00)
GFR calc Af Amer: >60 mL/min (ref >60)
GFR calc non Af Amer: >60 mL/min (ref >60)

## 2015-11-19 NOTE — Progress Notes (Signed)
Continues on oxygen at 5 L No new complaints. Feels that her breathing is the same as yesterday. Filed Vitals:   11/19/15 0725 11/19/15 0809 11/19/15 1158 11/19/15 1256  BP: 153/77   128/61  Pulse: 66   63  Temp:      TempSrc:      Resp: 18     Height:      Weight:      SpO2: 95% 91% 93% 92%   Very frail appearing No overt dyspnea @ rest HEENT WNL No JVD Diminished BS, scattered rhonchi, scattered coarse , Bilaterally. Lungs sound better than yesterday. RRR s M NABS, Soft No edema   BMP Latest Ref Rng 11/19/2015 11/14/2015 11/13/2015  Glucose 65 - 99 mg/dL - 152(H) 138(H)  BUN 6 - 20 mg/dL - 33(H) 35(H)  Creatinine 0.44 - 1.00 mg/dL 0.47 0.57 0.51  Sodium 135 - 145 mmol/L - 139 137  Potassium 3.5 - 5.1 mmol/L - 4.0 4.2  Chloride 101 - 111 mmol/L - 100(L) 95(L)  CO2 22 - 32 mmol/L - 33(H) 30  Calcium 8.9 - 10.3 mg/dL - 9.2 9.9    CBC Latest Ref Rng 11/14/2015 11/13/2015 06/30/2015  WBC 3.6 - 11.0 K/uL 10.0 14.5(H) 5.2  Hemoglobin 12.0 - 16.0 g/dL 15.0 17.0(H) 15.8  Hematocrit 35.0 - 47.0 % 44.8 49.0(H) 47.0  Platelets 150 - 440 K/uL 177 241 180    No new CXR  IMPRESSION: Smoker COPD - suspect severe @ baseline AECOPD Acute on chronic hypercarbic and hypoxemic respiratory failure   PLAN/REC: Cont supplemental O2 Continue  systemic steroids @ current dose Cont nebulized steroids and bronchodilators Cont Azithromycin Continue cough suppression with Tessalon Perles. Continue nicotine patch, discussed the importance of smoking cessation.   Dr Ashby Dawes M.D. PCCM service Pager [phone] 11/19/2015

## 2015-11-19 NOTE — Progress Notes (Signed)
Flutter valve performed X 10 breaths

## 2015-11-19 NOTE — Progress Notes (Signed)
[place] at [place] NAME: [name]    MR#:  [id number]  DATE OF BIRTH:  1946-01-01  SUBJECTIVE:  CHIEF COMPLAINT:   Chief Complaint  Patient presents with  . Weakness  . Shortness of Breath   Still short of breath. Clear sputum No pain. Feels weak.    REVIEW OF SYSTEMS:    Review of Systems  Constitutional: Positive for malaise/fatigue. Negative for fever and chills.  Respiratory: Positive for cough, sputum production, shortness of breath and wheezing. Negative for hemoptysis.   Cardiovascular: Negative for chest pain, palpitations, orthopnea and leg swelling.  Gastrointestinal: Negative for vomiting and abdominal pain.  Skin: Negative for rash.  Neurological: Positive for weakness.    DRUG ALLERGIES:   Allergies  Allergen Reactions  . Prednisone Rash  . Trazodone Rash    VITALS:  Blood pressure 153/77, pulse 66, temperature 97.5 F (36.4 C), temperature source Oral, resp. rate 18, height '5\' 2"'$  (1.575 m), weight 53.434 kg (117 lb 12.8 oz), SpO2 91 %.  PHYSICAL EXAMINATION:   Physical Exam  GENERAL:  70 y.o.-year-old patient lying in the bed with Respiratory distress EYES: Pupils equal, round, reactive to light and accommodation. No scleral icterus. Extraocular muscles intact.  HEENT: Head atraumatic, normocephalic. Oropharynx and nasopharynx clear.  NECK:  Supple, no jugular venous distention. No thyroid enlargement, no tenderness.  LUNGS: Increased work of breathing. Conversational dyspnea.Poor air entry. CARDIOVASCULAR: S1, S2 normal. No murmurs, rubs, or gallops.  ABDOMEN: Soft, nontender, nondistended. Bowel sounds present. No organomegaly or mass.  EXTREMITIES: No cyanosis, clubbing or edema b/l.  Bilateral lower extremity muscle wasting  NEUROLOGIC: Cranial nerves II through XII are intact. Motor strength 4/5 in lower extremities. 5/5 in upper extremity's PSYCHIATRIC: The patient is alert and oriented x  3.  SKIN: No obvious rash, lesion, or ulcer.   LABORATORY PANEL:   CBC  Recent Labs Lab 11/14/15 0313  WBC 10.0  HGB 15.0  HCT 44.8  PLT 177   ------------------------------------------------------------------------------------------------------------------ Chemistries   Recent Labs Lab 11/14/15 0313 11/19/15 0435  NA 139  --   K 4.0  --   CL 100*  --   CO2 33*  --   GLUCOSE 152*  --   BUN 33*  --   CREATININE 0.57 0.47  CALCIUM 9.2  --    ------------------------------------------------------------------------------------------------------------------  Cardiac Enzymes No results for input(s): TROPONINI in the last 168 hours. ------------------------------------------------------------------------------------------------------------------  RADIOLOGY:  No results found.   ASSESSMENT AND PLAN:   70 year old female with a history of COPD not on oxygen who presents with acute hypoxic respiratory failure.  * COPD exacerbation with acute hypoxic respiratory failure- no significant improvement. -IV steroids, Antibiotics - Scheduled Nebulizers - Inhalers -Wean O2 as tolerated. Still on 5 L oxygen - Initial and repeat chest x-ray showed no fluid or infiltrate.  *. Hypothyroid Synthroid  * Diet-controlled diabetes: Sliding Scale insulin  * Depression:  On Paxil  * Essential hypertension Continue propranolol  * Tobacco dependence:  Counseled to quit smoking on admission  * Post polio syndrome  * Non-Hodgkin's lymphoma in remission  All the records are reviewed and case discussed with Care Management/Social Workerr. Management plans discussed with the patient, family and they are in agreement.  CODE STATUS: FULL CODE  DVT Prophylaxis: SCDs  TOTAL TIME TAKING CARE OF THIS PATIENT: 30 minutes.   POSSIBLE D/C IN 1-2 DAYS, DEPENDING ON CLINICAL CONDITION.  [name] R M.D on 11/19/2015 at  11:39 AM  Between 7am to 6pm - Pager -  [phone]  After 6pm go to [email or web address] - password [email or web address] [facility]  Office  [phone]  CC: Primary care physician; Jennifer Brunner, MD  Note: This dictation was prepared with Dragon dictation along with smaller phrase technology. Any transcriptional errors that result from this process are unintentional.

## 2015-11-20 LAB — CULTURE, RESPIRATORY W GRAM STAIN: Culture: NORMAL

## 2015-11-20 LAB — CULTURE, RESPIRATORY: SPECIAL REQUESTS: NORMAL

## 2015-11-20 LAB — CREATININE, SERUM: Creatinine, Ser: 0.5 mg/dL (ref 0.44–1.00)

## 2015-11-20 MED ORDER — METOPROLOL TARTRATE 25 MG PO TABS
12.5000 mg | ORAL_TABLET | Freq: Two times a day (BID) | ORAL | Status: AC
Start: 2015-11-20 — End: ?

## 2015-11-20 MED ORDER — FLUTICASONE-SALMETEROL 250-50 MCG/DOSE IN AEPB: 1.0000 | INHALATION_SPRAY | Freq: Two times a day (BID) | RESPIRATORY_TRACT | Status: AC

## 2015-11-20 MED ORDER — PREDNISONE 10 MG PO TABS: 10.0000 mg | ORAL_TABLET | Freq: Every day | ORAL | Status: DC

## 2015-11-20 MED ORDER — IPRATROPIUM-ALBUTEROL 0.5-2.5 (3) MG/3ML IN SOLN: 3.0000 mL | RESPIRATORY_TRACT | Status: AC | PRN

## 2015-11-20 MED ORDER — BENZONATATE 100 MG PO CAPS: 100.0000 mg | ORAL_CAPSULE | Freq: Three times a day (TID) | ORAL | Status: DC | PRN

## 2015-11-20 MED ORDER — CLONAZEPAM 0.5 MG PO TABS: 0.5000 mg | ORAL_TABLET | Freq: Two times a day (BID) | ORAL | Status: DC | PRN

## 2015-11-20 NOTE — Progress Notes (Signed)
EMS called for transport. Marinell Igarashi S, RN  

## 2015-11-20 NOTE — Care Management Important Message (Signed)
Important Message  Patient Details  Name: [name] MRN: [id number] Date of Birth: Jan 31, 1946   Medicare Important Message Given:  Yes    Shelbie Ammons, RN 11/20/2015, 8:51 AM

## 2015-11-20 NOTE — Progress Notes (Signed)
Report called to [facility]. Patient and family updated. Madlyn Frankel, RN

## 2015-11-20 NOTE — Discharge Instructions (Signed)
°  DIET:  Regular diet  DISCHARGE CONDITION:  Stable  ACTIVITY:  Activity as tolerated  OXYGEN:  Home Oxygen: Yes.     Oxygen Delivery: 4 liters/min via Patient connected to nasal cannula oxygen  DISCHARGE LOCATION:  nursing home   If you experience worsening of your admission symptoms, develop shortness of breath, life threatening emergency, suicidal or homicidal thoughts you must seek medical attention immediately by calling 911 or calling your MD immediately  if symptoms less severe.  You Must read complete instructions/literature along with all the possible adverse reactions/side effects for all the Medicines you take and that have been prescribed to you. Take any new Medicines after you have completely understood and accpet all the possible adverse reactions/side effects.   Please note  You were cared for by a hospitalist during your hospital stay. If you have any questions about your discharge medications or the care you received while you were in the hospital after you are discharged, you can call the unit and asked to speak with the hospitalist on call if the hospitalist that took care of you is not available. Once you are discharged, your primary care physician will handle any further medical issues. Please note that NO REFILLS for any discharge medications will be authorized once you are discharged, as it is imperative that you return to your primary care physician (or establish a relationship with a primary care physician if you do not have one) for your aftercare needs so that they can reassess your need for medications and monitor your lab values.

## 2015-11-20 NOTE — Discharge Summary (Signed)
[name] [place] NAME: [name]    MR#:  [id number]  DATE OF BIRTH:  [birth year]  DATE OF ADMISSION:  11/13/2015 ADMITTING PHYSICIAN: Jennifer Costa, MD  DATE OF DISCHARGE: 11/20/2015  PRIMARY CARE PHYSICIAN: Jennifer Brunner, MD   ADMISSION DIAGNOSIS:  Acute bronchitis with bronchospasm [J20.9]  DISCHARGE DIAGNOSIS:  Active Problems:   COPD exacerbation (HCC)   Malnutrition of moderate degree   SECONDARY DIAGNOSIS:   Past Medical History  Diagnosis Date  . Hypertension   . Diabetes mellitus ([place])   . GERD (gastroesophageal reflux disease)   . Postpolio syndrome     left leg paralysis  . Depression   . Degenerative disc disease, cervical   . Polio   . Cancer [facility])     hodgkins lymphoma dx 2011 chemo and rad tx finished in 2012     Crivitz is a 70 y.o. female with a known history of Post polio syndrome, Diabetes and COPD who is not on oxygen who presents with above complaint. Patient reports over the past 2-3 days she's had increasing shortness of breath, cough, low-grade fevers and yellow sputum. She also says for the past day she's had significant wheezing. She was brought to the ER where she will was having tachypnea and evidence of tripoding. She has been given nebulizer treatments and continues to have wheezing and also has a new oxygen requirement. Chest x-ray does not show pneumonia.  HOSPITAL COURSE:   70 year old female with a history of COPD not on oxygen who presents with acute hypoxic respiratory failure.  * COPD exacerbation with acute hypoxic respiratory failure- no significant improvement. -IV steroids, Antibiotics. Finished 1 week abx. Change to prednisone taper for 8 days. - Nebulizers - Inhalers - Advair -Wean O2 as tolerated. Still on 4 L oxygen - Initial and repeat chest x-ray showed no fluid or infiltrate. PCP and Pulmonary f/u after discharge  *.  Hypothyroid Synthroid  * Diet-controlled diabetes  * Depression:  On Paxil  * Essential hypertension Continue metoprolol  * Tobacco dependence:  Counseled to quit smoking   * Post polio syndrome  * Non-Hodgkin's lymphoma in remission  Stable for discharge to SNF  CONSULTS OBTAINED:  Treatment Team:  Jennifer Mcardle, MD  DRUG ALLERGIES:   Allergies  Allergen Reactions  . Prednisone Rash  . Trazodone Rash    DISCHARGE MEDICATIONS:   Current Discharge Medication List    START taking these medications   Details  benzonatate (TESSALON) 100 MG capsule Take 1 capsule (100 mg total) by mouth 3 (three) times daily as needed for cough. Qty: 20 capsule, Refills: 0    Fluticasone-Salmeterol (ADVAIR DISKUS) 250-50 MCG/DOSE AEPB Inhale 1 puff into the lungs 2 (two) times daily. Qty: 60 each    ipratropium-albuterol (DUONEB) 0.5-2.5 (3) MG/3ML SOLN Take 3 mLs by nebulization every 4 (four) hours as needed. Qty: 360 mL    metoprolol tartrate (LOPRESSOR) 25 MG tablet Take 0.5 tablets (12.5 mg total) by mouth 2 (two) times daily. Qty: 60 tablet, Refills: 0    predniSONE (DELTASONE) 10 MG tablet Take 1 tablet (10 mg total) by mouth daily with breakfast. Qty: 20 tablet, Refills: 0      CONTINUE these medications which have CHANGED   Details  clonazePAM (KLONOPIN) 0.5 MG tablet Take 1 tablet (0.5 mg total) by mouth 2 (two) times daily as needed for anxiety. Qty: 15 tablet, Refills: 0  Associated Diagnoses: Rapid weight loss      CONTINUE these medications which have NOT CHANGED   Details  albuterol (PROVENTIL HFA;VENTOLIN HFA) 108 (90 Base) MCG/ACT inhaler Inhale 2 puffs into the lungs every 4 (four) hours as needed for wheezing or shortness of breath.    aspirin EC 81 MG tablet Take 81 mg by mouth daily.    b complex vitamins tablet Take 1 tablet by mouth daily.    cholecalciferol (VITAMIN D) 1000 units tablet Take 1,000 Units by mouth daily.   Associated  Diagnoses: Rapid weight loss    divalproex (DEPAKOTE) 250 MG DR tablet Take 250 mg by mouth 2 (two) times daily.    Associated Diagnoses: Rapid weight loss    levothyroxine (SYNTHROID, LEVOTHROID) 50 MCG tablet Take 50 mcg by mouth daily before breakfast.    Associated Diagnoses: Rapid weight loss    losartan (COZAAR) 100 MG tablet Take 100 mg by mouth daily.    Associated Diagnoses: Rapid weight loss    mirabegron ER (MYRBETRIQ) 50 MG TB24 tablet Take 50 mg by mouth at bedtime.    Associated Diagnoses: Rapid weight loss    omega-3 acid ethyl esters (LOVAZA) 1 g capsule Take 1 g by mouth daily.    PARoxetine (PAXIL) 20 MG tablet Take 20 mg by mouth at bedtime.    Associated Diagnoses: Rapid weight loss      STOP taking these medications     propranolol ER (INDERAL LA) 60 MG 24 hr capsule         Today   VITAL SIGNS:  Blood pressure 109/49, pulse 119, temperature 97.8 F (36.6 C), temperature source Oral, resp. rate 16, height '5\' 2"'$  (1.575 m), weight 55.248 kg (121 lb 12.8 oz), SpO2 92 %.  I/O:   Intake/Output Summary (Last 24 hours) at 11/20/15 1222 Last data filed at 11/20/15 0745  Gross per 24 hour  Intake    480 ml  Output      0 ml  Net    480 ml    PHYSICAL EXAMINATION:  Physical Exam  GENERAL:  70 y.o.-year-old patient lying in the bed LUNGS: good air entry and no wheezing. Has increased WOB. CARDIOVASCULAR: S1, S2 normal. No murmurs, rubs, or gallops.  ABDOMEN: Soft, non-tender, non-distended. Bowel sounds present. No organomegaly or mass.  NEUROLOGIC: Moves all 4 extremities. PSYCHIATRIC: The patient is alert and oriented x 3.  SKIN: No obvious rash, lesion, or ulcer.   DATA REVIEW:   CBC  Recent Labs Lab 11/14/15 0313  WBC 10.0  HGB 15.0  HCT 44.8  PLT 177    Chemistries   Recent Labs Lab 11/14/15 0313  11/20/15 0450  NA 139  --   --   K 4.0  --   --   CL 100*  --   --   CO2 33*  --   --   GLUCOSE 152*  --   --   BUN 33*  --   --    CREATININE 0.57  < > 0.50  CALCIUM 9.2  --   --   < > = values in this interval not displayed.  Cardiac Enzymes No results for input(s): TROPONINI in the last 168 hours.  Microbiology Results  Results for orders placed or performed during the hospital encounter of 11/13/15  Culture, expectorated sputum-assessment     Status: None   Collection Time: 11/17/15  3:40 PM  Result Value Ref Range Status   Specimen Description SPUTUM  Final   Special Requests Normal  Final   Sputum evaluation THIS SPECIMEN IS ACCEPTABLE FOR SPUTUM CULTURE  Final   Report Status 11/17/2015 FINAL  Final  Culture, respiratory (NON-Expectorated)     Status: None   Collection Time: 11/17/15  3:40 PM  Result Value Ref Range Status   Specimen Description SPUTUM  Final   Special Requests Normal Reflexed from [id number]  Final   Gram Stain RARE GRAM POSITIVE COCCI FEW WBC SEEN   Final   Culture Consistent with normal respiratory flora.  Final   Report Status 11/20/2015 FINAL  Final    RADIOLOGY:  No results found.  Follow up with PCP in 1 week.  Management plans discussed with the patient, family and they are in agreement.  CODE STATUS:     Code Status Orders        Start     Ordered   11/13/15 1556  Full code   Continuous     11/13/15 1555    Code Status History    Date Active Date Inactive Code Status Order ID Comments User Context   This patient has a current code status but no historical code status.      TOTAL TIME TAKING CARE OF THIS PATIENT ON DAY OF DISCHARGE: more than 30 minutes.   Hillary Bow R M.D on 11/20/2015 at 12:22 PM  Between 7am to 6pm - Pager - [phone]  After 6pm go to [email or web address] - password [email or web address] [facility]  Office  [phone]  CC: Primary care physician; Jennifer Brunner, MD  Note: This dictation was prepared with Dragon dictation along with smaller phrase technology. Any transcriptional errors that result from this process are  unintentional.

## 2015-11-20 NOTE — Progress Notes (Signed)
Patient discharged via EMS. Yahel Fuston S, RN  

## 2015-11-24 ENCOUNTER — Encounter: Payer: Self-pay | Admitting: Pulmonary Disease

## 2015-11-24 ENCOUNTER — Ambulatory Visit (INDEPENDENT_AMBULATORY_CARE_PROVIDER_SITE_OTHER): Payer: Medicare Other | Admitting: Pulmonary Disease

## 2015-11-24 VITALS — BP 110/58 | HR 54 | Ht 60.0 in | Wt 117.0 lb

## 2015-11-24 DIAGNOSIS — Z720 Tobacco use: Secondary | ICD-10-CM | POA: Diagnosis not present

## 2015-11-24 DIAGNOSIS — J449 Chronic obstructive pulmonary disease, unspecified: Secondary | ICD-10-CM | POA: Diagnosis not present

## 2015-11-24 DIAGNOSIS — J9611 Chronic respiratory failure with hypoxia: Secondary | ICD-10-CM

## 2015-11-24 DIAGNOSIS — J441 Chronic obstructive pulmonary disease with (acute) exacerbation: Secondary | ICD-10-CM

## 2015-11-24 DIAGNOSIS — F17200 Nicotine dependence, unspecified, uncomplicated: Secondary | ICD-10-CM

## 2015-11-24 DIAGNOSIS — J9612 Chronic respiratory failure with hypercapnia: Secondary | ICD-10-CM

## 2015-11-25 NOTE — Clinical Social Work Placement (Signed)
   CLINICAL SOCIAL WORK PLACEMENT  NOTE  Date:  11/25/2015  Patient Details  Name: [name] MRN: [id number] Date of Birth: 1945-10-10  Clinical Social Work is seeking post-discharge placement for this patient at the   level of care (*CSW will initial, date and re-position this form in  chart as items are completed):  Yes   Patient/family provided with [place] Work Department's list of facilities offering this level of care within the geographic area requested by the patient (or if unable, by the patient's family).  Yes   Patient/family informed of their freedom to choose among providers that offer the needed level of care, that participate in Medicare, Medicaid or managed care program needed by the patient, have an available bed and are willing to accept the patient.  Yes   Patient/family informed of [facility]'s ownership interest in [facility] and [facility], as well as of the fact that they are under no obligation to receive care at these facilities.  PASRR submitted to EDS on 11/15/15     PASRR number received on 11/15/15     Existing PASRR number confirmed on       FL2 transmitted to all facilities in geographic area requested by pt/family on 11/15/15     FL2 transmitted to all facilities within larger geographic area on       Patient informed that his/her managed care company has contracts with or will negotiate with certain facilities, including the following:        Yes   Patient/family informed of bed offers received.  Patient chooses bed at [facility]     Physician recommends and patient chooses bed at      Patient to be transferred to [facility] on 11/20/15.  Patient to be transferred to facility by [facility]     Patient family notified on 11/20/15 of transfer.  Name of family member notified:  Pt and daughters     PHYSICIAN       Additional Comment:     _______________________________________________ Darden Dates, LCSW 11/25/2015, 10:36 AM

## 2015-11-25 NOTE — Clinical Social Work Note (Signed)
Pt is ready for discharge to [facility]. Facility is ready to admit pt have as they received discharge information. Pt and family are aware and in agreement with discharge plan. RN called report and EMS provided transportation. CSW is signing off as no further needs identified.   Darden Dates, MSW, LCSW  Clinical Social Worker  [phone]

## 2015-11-29 NOTE — Progress Notes (Signed)
PULMONARY POST-HOSPITALIZATION OFFICE FOLLOW-UP  SUMMARY: 70 y.o. F smoker with post-polio syndrome, COPD hospitalized 05/29-06/05/17 for AECOPD. Seen in consultation by Dr Mortimer Fries. Discharged home on Advair, prednisone taper, PRN nebulized bronchodilators and O2  PROBLEMS: COPD, emphysema Chronic hypoxic, hypercarbic respiratory failure Smoker Post polio syndrome - at her baseline, this is more limiting than dyspnea  INTERVAL HISTORY: No major developments since discharge.   SUBJ: Has remained abstinent from cigarettes since discharge. Denies CP, fever, purulent sputum, hemoptysis, LE edema and calf tenderness. Expresses desire to come off O2  OBJ: Filed Vitals:   11/24/15 1055  BP: 110/58  Pulse: 54  Height: 5' (1.524 m)  Weight: 117 lb (53.071 kg)  SpO2: 93%  O2 3 LPM [place]  Thin, NAD, in WC HEENT WNL No JVD Hyperresonant, decreased BS, no wheezes RRR s M NABS No C/C/E   DATA:   BMP Latest Ref Rng 11/20/2015 11/19/2015 11/14/2015  Glucose 65 - 99 mg/dL - - 152(H)  BUN 6 - 20 mg/dL - - 33(H)  Creatinine 0.44 - 1.00 mg/dL 0.50 0.47 0.57  Sodium 135 - 145 mmol/L - - 139  Potassium 3.5 - 5.1 mmol/L - - 4.0  Chloride 101 - 111 mmol/L - - 100(L)  CO2 22 - 32 mmol/L - - 33(H)  Calcium 8.9 - 10.3 mg/dL - - 9.2    CBC Latest Ref Rng 11/14/2015 11/13/2015 06/30/2015  WBC 3.6 - 11.0 K/uL 10.0 14.5(H) 5.2  Hemoglobin 12.0 - 16.0 g/dL 15.0 17.0(H) 15.8  Hematocrit 35.0 - 47.0 % 44.8 49.0(H) 47.0  Platelets 150 - 440 K/uL 177 241 180    CXR (11/15/15): no acute disease CT chest 07/11/15: moderate emphysema    IMPRESSION:   1) recently treated AECOPD 2) Emphysema by CT chest 07/11/15 - appears to be at least moderate 3) Chronic hypoxic and hypercarbic respiratory failure 4) Smoker - has abstained since hospitalized 11/13/15   PLAN:  1) Cont current medical regimen as documented 2) I emphasized the need to remain abstinent from cigarettes 3) I explained that the O2 is likely  long term/permanent 4) ROV 6-8 weeks  Merton Border, MD PCCM service Mobile [phone] Pager [phone] 11/29/2015

## 2016-01-11 ENCOUNTER — Encounter: Payer: Self-pay | Admitting: Pulmonary Disease

## 2016-01-11 ENCOUNTER — Ambulatory Visit (INDEPENDENT_AMBULATORY_CARE_PROVIDER_SITE_OTHER): Payer: Medicare Other | Admitting: Pulmonary Disease

## 2016-01-11 VITALS — BP 118/70 | HR 65 | Ht 60.0 in | Wt 130.0 lb

## 2016-01-11 DIAGNOSIS — J449 Chronic obstructive pulmonary disease, unspecified: Secondary | ICD-10-CM

## 2016-01-11 DIAGNOSIS — R0902 Hypoxemia: Secondary | ICD-10-CM

## 2016-01-11 MED ORDER — PREDNISONE 5 MG PO TABS: 5.0000 mg | ORAL_TABLET | Freq: Every day | ORAL | 2 refills | Status: DC

## 2016-01-11 NOTE — Patient Instructions (Signed)
Decrease prednisone to 5 mg daily until I see you again in the office Follow up in 6 weeks

## 2016-01-14 NOTE — Progress Notes (Signed)
PULMONARY OFFICE FOLLOW-UP  SUMMARY: 70 y.o. F smoker with post-polio syndrome, COPD hospitalized 05/29-06/05/17 for AECOPD. Seen in consultation by Dr Mortimer Fries. Discharged home on Advair, prednisone taper, PRN nebulized bronchodilators and O2.   CXR (11/15/15): no acute disease CT chest 07/11/15: moderate emphysema    PROBLEMS: COPD, emphysema Chronic hypoxic, hypercarbic respiratory failure Smoker - presently abstinent Post polio syndrome - at her baseline, this is more limiting than dyspnea  INTERVAL HISTORY: No major developments since last visit    SUBJ: Has remained abstinent from cigarettes. Feels fully back to her baseline. Maintained on Advair. Rarely using albuterol MDI. Never using Duoneb. Remains on prednisone 10 mg daily. Denies CP, fever, purulent sputum, hemoptysis, LE edema and calf tenderness.   OBJ: Vitals:   01/11/16 1130  BP: 118/70  Pulse: 65  SpO2: 94%  Weight: 130 lb (59 kg)  Height: 5' (1.524 m)    Thin, NAD, in WC HEENT WNL No JVD Hyperresonant, decreased BS, no wheezes RRR s M NABS No C/C/E   DATA:   BMP Latest Ref Rng & Units 11/20/2015 11/19/2015 11/14/2015  Glucose 65 - 99 mg/dL - - 152(H)  BUN 6 - 20 mg/dL - - 33(H)  Creatinine 0.44 - 1.00 mg/dL 0.50 0.47 0.57  Sodium 135 - 145 mmol/L - - 139  Potassium 3.5 - 5.1 mmol/L - - 4.0  Chloride 101 - 111 mmol/L - - 100(L)  CO2 22 - 32 mmol/L - - 33(H)  Calcium 8.9 - 10.3 mg/dL - - 9.2    CBC Latest Ref Rng & Units 11/14/2015 11/13/2015 06/30/2015  WBC 3.6 - 11.0 K/uL 10.0 14.5(H) 5.2  Hemoglobin 12.0 - 16.0 g/dL 15.0 17.0(H) 15.8  Hematocrit 35.0 - 47.0 % 44.8 49.0(H) 47.0  Platelets 150 - 440 K/uL 177 241 180      IMPRESSION:   1) recently treated AECOPD 2) Emphysema by CT chest 07/11/15 - appears to be at least moderate 3) Chronic hypoxic and hypercarbic respiratory failure 4) Smoker - has abstained since hospitalized 11/13/15 5) has been on prednisone since discharge in June  PLAN:   1) Cont Advair and PRN albuterol, oxygen 2) I reiterated the need to remain abstinent from cigarettes 3) I explained that the O2 is likely long term/permanent 4) Decrease prednisone to 5 mg daily 5) ROV 6 weeks at which time we will attempt to further taper prednisone. Also should get PFTs @ that time and re-assess her O2 needs  Merton Border, MD PCCM service Mobile [phone] Pager [phone] 01/14/2016

## 2016-02-22 ENCOUNTER — Ambulatory Visit: Payer: Medicare Other | Admitting: Pulmonary Disease

## 2016-02-26 ENCOUNTER — Encounter: Payer: Self-pay | Admitting: Pulmonary Disease

## 2016-02-26 ENCOUNTER — Ambulatory Visit (INDEPENDENT_AMBULATORY_CARE_PROVIDER_SITE_OTHER): Payer: Medicare Other | Admitting: Pulmonary Disease

## 2016-02-26 VITALS — BP 124/70 | HR 65 | Ht 60.0 in | Wt 131.0 lb

## 2016-02-26 DIAGNOSIS — F17200 Nicotine dependence, unspecified, uncomplicated: Secondary | ICD-10-CM

## 2016-02-26 DIAGNOSIS — R0902 Hypoxemia: Secondary | ICD-10-CM

## 2016-02-26 DIAGNOSIS — Z720 Tobacco use: Secondary | ICD-10-CM | POA: Diagnosis not present

## 2016-02-26 DIAGNOSIS — J438 Other emphysema: Secondary | ICD-10-CM

## 2016-02-26 DIAGNOSIS — J449 Chronic obstructive pulmonary disease, unspecified: Secondary | ICD-10-CM

## 2016-02-26 NOTE — Patient Instructions (Signed)
Smoking cessation Continue Advair Decrease prednisone to 5 mg (1/2 tab) every other day (on odd numbered days) through September, then stop Follow up in 3 months or sooner as needed

## 2016-02-28 NOTE — Progress Notes (Signed)
PULMONARY OFFICE FOLLOW-UP  SUMMARY: 70 y.o. F smoker with post-polio syndrome, COPD hospitalized 05/29-06/05/17 for AECOPD. Seen in consultation by Dr Mortimer Fries. Discharged home on Advair, prednisone taper, PRN nebulized bronchodilators and O2.   CXR (11/15/15): no acute disease CT chest 07/11/15: moderate emphysema    PROBLEMS: COPD, emphysema Chronic hypoxic, hypercarbic respiratory failure Smoker - presently abstinent Post polio syndrome - at her baseline, this is more limiting than dyspnea  INTERVAL HISTORY: No major developments since last visit    SUBJ: Has resumed smoking 3-4 cigs per day. Remains at her baseline. Continues Advair. Not using albuterol MDI as all. Remains on prednisone 5 mg daily. Denies CP, fever, purulent sputum, hemoptysis, LE edema and calf tenderness.   OBJ: Vitals:   02/26/16 1415  BP: 124/70  Pulse: 65  SpO2: 95%  Weight: 131 lb (59.4 kg)  Height: 5' (1.524 m)    Thin, NAD, in WC HEENT WNL No JVD Hyperresonant, decreased BS, no wheezes RRR s M NABS No C/C/E   DATA:   BMP Latest Ref Rng & Units 11/20/2015 11/19/2015 11/14/2015  Glucose 65 - 99 mg/dL - - 152(H)  BUN 6 - 20 mg/dL - - 33(H)  Creatinine 0.44 - 1.00 mg/dL 0.50 0.47 0.57  Sodium 135 - 145 mmol/L - - 139  Potassium 3.5 - 5.1 mmol/L - - 4.0  Chloride 101 - 111 mmol/L - - 100(L)  CO2 22 - 32 mmol/L - - 33(H)  Calcium 8.9 - 10.3 mg/dL - - 9.2    CBC Latest Ref Rng & Units 11/14/2015 11/13/2015 06/30/2015  WBC 3.6 - 11.0 K/uL 10.0 14.5(H) 5.2  Hemoglobin 12.0 - 16.0 g/dL 15.0 17.0(H) 15.8  Hematocrit 35.0 - 47.0 % 44.8 49.0(H) 47.0  Platelets 150 - 440 K/uL 177 241 180      IMPRESSION:   1) COPD with recent exacerbation 2) Emphysema by CT chest 07/11/15 3) Chronic hypoxic and hypercarbic respiratory failure 4) Smoker - relapse  PLAN:  1) Cont Advair and PRN albuterol, Cont oxygen by [place] 2) Again counseled re: smoking cessation 3) Decrease prednisone to 5 mg QOD through  September, then stop 5) ROV 3 months at which time we should get PFTs and re-assess her O2 needs  Merton Border, MD PCCM service Mobile [phone] Pager [phone] 02/28/2016

## 2016-05-08 ENCOUNTER — Telehealth: Payer: Self-pay | Admitting: Pulmonary Disease

## 2016-05-08 NOTE — Telephone Encounter (Signed)
Pt was seen 02/26/16 for COPD by DS. Pt is requesting Tessalon Pearles for cough. Last refill was 11/2015 by Alveta Heimlich Sudini. Pt due to f/u w/DS on 05/23/16. Please advise if we can refill.

## 2016-05-08 NOTE — Telephone Encounter (Signed)
Calling eMD for tessalon perles '100mg'$  tid because earlier call did not go through to pharmacy. Sent 30days without refill  Dr. Brand Males, M.D., [facility].C.P Pulmonary and Critical Care Medicine Staff Physician [facility] Pager: [phone], If no answer or between  15:00h - 7:00h: call [phone]  05/08/2016 4:40 PM

## 2016-05-08 NOTE — Telephone Encounter (Signed)
Pt would like a rx for Tessalon pearls called to [facility].

## 2016-05-08 NOTE — Telephone Encounter (Signed)
ok 

## 2016-05-13 NOTE — Telephone Encounter (Signed)
Will close note due to Loetta Rough sent to pharmacy by doc on call.

## 2016-05-23 ENCOUNTER — Ambulatory Visit (INDEPENDENT_AMBULATORY_CARE_PROVIDER_SITE_OTHER): Payer: Medicare Other | Admitting: Pulmonary Disease

## 2016-05-23 ENCOUNTER — Encounter: Payer: Self-pay | Admitting: Pulmonary Disease

## 2016-05-23 VITALS — BP 120/66 | HR 87 | Ht 60.0 in | Wt 133.0 lb

## 2016-05-23 DIAGNOSIS — J449 Chronic obstructive pulmonary disease, unspecified: Secondary | ICD-10-CM

## 2016-05-23 DIAGNOSIS — J9611 Chronic respiratory failure with hypoxia: Secondary | ICD-10-CM

## 2016-05-23 NOTE — Progress Notes (Signed)
PULMONARY OFFICE FOLLOW-UP  SUMMARY: 70 y.o. F smoker with post-polio syndrome, COPD hospitalized 05/29-06/05/17 for AECOPD. Seen in consultation by Dr Mortimer Fries. Discharged home on Advair, prednisone taper, PRN nebulized bronchodilators and O2.   CXR (11/15/15): no acute disease CT chest 07/11/15: moderate emphysema    PROBLEMS: COPD, emphysema Chronic hypoxic, hypercarbic respiratory failure Smoker  Post polio syndrome - at her baseline, this is more limiting than dyspnea  INTERVAL HISTORY: URI symptoms 2 wks ago. Treated with Z pak prescribed by Dr Gilford Rile. Back to baseline. Treated recently for LUE cellulitis as well.   SUBJ: Denies dyspnea and cough and other respiratory/chest symptoms. Suffered a fall on day prior to this visit with LUE contusion. She is frustrated with her declining level of function but this is not related to her pumonary disease. Denies CP, fever, purulent sputum, hemoptysis, LE edema and calf tenderness.  She has not smoked in approx 2 weeks.  She is now fully off of prednisone since end of September without any deterioration in her symptoms.  OBJ: Vitals:   05/23/16 1404  BP: 120/66  Pulse: 87  SpO2: 97%  Weight: 133 lb (60.3 kg)  Height: 5' (1.524 m)    Thin, NAD, in WC HEENT WNL No JVD Decreased BS, no wheezes RRR s M NABS No C/C/E. LUE contusion.    DATA:   BMP Latest Ref Rng & Units 11/20/2015 11/19/2015 11/14/2015  Glucose 65 - 99 mg/dL - - 152(H)  BUN 6 - 20 mg/dL - - 33(H)  Creatinine 0.44 - 1.00 mg/dL 0.50 0.47 0.57  Sodium 135 - 145 mmol/L - - 139  Potassium 3.5 - 5.1 mmol/L - - 4.0  Chloride 101 - 111 mmol/L - - 100(L)  CO2 22 - 32 mmol/L - - 33(H)  Calcium 8.9 - 10.3 mg/dL - - 9.2    CBC Latest Ref Rng & Units 11/14/2015 11/13/2015 06/30/2015  WBC 3.6 - 11.0 K/uL 10.0 14.5(H) 5.2  Hemoglobin 12.0 - 16.0 g/dL 15.0 17.0(H) 15.8  Hematocrit 35.0 - 47.0 % 44.8 49.0(H) 47.0  Platelets 150 - 440 K/uL 177 241 180   No new  CXR   IMPRESSION:   1) COPD/emphysema, stable 2) Smoker - I have congratulated her on her success at abstinence over past coupel of weeks and encouraged continued abstinence 3) Severe physical disability due to post-polio symdrome  PLAN:  1) Cont Advair and PRN albuterol, Cont oxygen by [place] [facility] and as needed during day 2) Again counseled re: smoking cessation 3) I suggested that we seek some home health assistance to allow for improved mobility and ability to get out of the house. She wishes to think about this before agreeing to an in home assessment 5) ROV 3-4 months  Merton Border, MD PCCM service Mobile [phone] Pager [phone] 05/23/2016

## 2016-05-23 NOTE — Patient Instructions (Signed)
1) Congratulations on the smoking cessation - keep up the good work 2) Continue Advair 3) Continue albuterol inhaler as needed 4) If shortness of breath is not relieved with albuterol inhaler, use the nebulizer machine with Duoneb 5) Continue oxygen @ 2 lpm with sleep and as needed during the day 6) If you wish for [place] to help find assistance medical devices such as a wheelchair, call us and we will do what we can 7)Follow up with me in 3-4 months

## 2016-07-01 ENCOUNTER — Other Ambulatory Visit: Payer: Medicare Other

## 2016-07-01 ENCOUNTER — Ambulatory Visit: Payer: Medicare Other | Admitting: Internal Medicine

## 2016-07-03 ENCOUNTER — Telehealth: Payer: Self-pay | Admitting: *Deleted

## 2016-07-03 NOTE — Telephone Encounter (Signed)
Received referral for low dose lung cancer screening CT scan. Voicemail left at phone number listed in EMR for patient to call me back to facilitate scheduling scan.  

## 2016-07-15 ENCOUNTER — Inpatient Hospital Stay (HOSPITAL_BASED_OUTPATIENT_CLINIC_OR_DEPARTMENT_OTHER): Payer: Medicare Other | Admitting: Internal Medicine

## 2016-07-15 ENCOUNTER — Inpatient Hospital Stay: Payer: Medicare Other | Attending: Internal Medicine

## 2016-07-15 DIAGNOSIS — J449 Chronic obstructive pulmonary disease, unspecified: Secondary | ICD-10-CM | POA: Insufficient documentation

## 2016-07-15 DIAGNOSIS — K219 Gastro-esophageal reflux disease without esophagitis: Secondary | ICD-10-CM | POA: Diagnosis not present

## 2016-07-15 DIAGNOSIS — F17210 Nicotine dependence, cigarettes, uncomplicated: Secondary | ICD-10-CM | POA: Diagnosis not present

## 2016-07-15 DIAGNOSIS — Z7982 Long term (current) use of aspirin: Secondary | ICD-10-CM | POA: Insufficient documentation

## 2016-07-15 DIAGNOSIS — G250 Essential tremor: Secondary | ICD-10-CM

## 2016-07-15 DIAGNOSIS — C8170 Other classical Hodgkin lymphoma, unspecified site: Secondary | ICD-10-CM

## 2016-07-15 DIAGNOSIS — Z79899 Other long term (current) drug therapy: Secondary | ICD-10-CM

## 2016-07-15 DIAGNOSIS — E119 Type 2 diabetes mellitus without complications: Secondary | ICD-10-CM | POA: Insufficient documentation

## 2016-07-15 DIAGNOSIS — Z8571 Personal history of Hodgkin lymphoma: Secondary | ICD-10-CM | POA: Diagnosis present

## 2016-07-15 DIAGNOSIS — D751 Secondary polycythemia: Secondary | ICD-10-CM

## 2016-07-15 DIAGNOSIS — I10 Essential (primary) hypertension: Secondary | ICD-10-CM | POA: Insufficient documentation

## 2016-07-15 DIAGNOSIS — F329 Major depressive disorder, single episode, unspecified: Secondary | ICD-10-CM | POA: Diagnosis not present

## 2016-07-15 LAB — CBC WITH DIFFERENTIAL/PLATELET
BASOS ABS: 0 10*3/uL (ref 0–0.1)
BASOS PCT: 1 %
EOS PCT: 3 %
Eosinophils Absolute: 0.2 10*3/uL (ref 0–0.7)
HCT: 43.1 % (ref 35.0–47.0)
Hemoglobin: 15 g/dL (ref 12.0–16.0)
Lymphocytes Relative: 29 %
Lymphs Abs: 1.8 10*3/uL (ref 1.0–3.6)
MCH: 33.5 pg (ref 26.0–34.0)
MCHC: 34.8 g/dL (ref 32.0–36.0)
MCV: 96.5 fL (ref 80.0–100.0)
MONO ABS: 0.5 10*3/uL (ref 0.2–0.9)
Monocytes Relative: 7 %
Neutro Abs: 3.8 10*3/uL (ref 1.4–6.5)
Neutrophils Relative %: 60 %
PLATELETS: 184 10*3/uL (ref 150–440)
RBC: 4.47 MIL/uL (ref 3.80–5.20)
RDW: 14.3 % (ref 11.5–14.5)
WBC: 6.3 10*3/uL (ref 3.6–11.0)

## 2016-07-15 LAB — COMPREHENSIVE METABOLIC PANEL
ALBUMIN: 3.7 g/dL (ref 3.5–5.0)
ALT: 22 U/L (ref 14–54)
AST: 24 U/L (ref 15–41)
Alkaline Phosphatase: 62 U/L (ref 38–126)
Anion gap: 4 — ABNORMAL LOW (ref 5–15)
BUN: 20 mg/dL (ref 6–20)
CHLORIDE: 103 mmol/L (ref 101–111)
CO2: 29 mmol/L (ref 22–32)
Calcium: 9 mg/dL (ref 8.9–10.3)
Creatinine, Ser: 0.45 mg/dL (ref 0.44–1.00)
GFR calc Af Amer: >60 mL/min (ref >60)
GLUCOSE: 96 mg/dL (ref 65–99)
POTASSIUM: 4.3 mmol/L (ref 3.5–5.1)
Sodium: 136 mmol/L (ref 135–145)
Total Bilirubin: 0.6 mg/dL (ref 0.3–1.2)
Total Protein: 7.6 g/dL (ref 6.5–8.1)

## 2016-07-15 LAB — LACTATE DEHYDROGENASE: LDH: 153 U/L (ref 98–192)

## 2016-07-15 NOTE — Assessment & Plan Note (Addendum)
Hodgkin's lymphoma stage II- [ finish 2011];  Clinically no evidence of recurrence.  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  #  Weight loss: Holding steady. No weight loss at this time. Monitor.  #  Active smoker-  Recommended quitting;  Patient states she is trying to quit. She is smoker fewer cigarettes daily.   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 Neurology following. Medications aren't helpful.  # Lipoma on left middle back under scapula: Non-painful. Noticed about two weeks ago. Monitor.   # Follow-up one year labs (CBC, CMET)/MD

## 2016-07-15 NOTE — Progress Notes (Signed)
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 Using a nebulizer on occasion. No new cough or hemoptysis. Patient's appetite is much better.  Patient denies any further weight loss. Denies any nausea vomiting. Denies any headaches. Admits to a small lump she noticed on her back, left side. She denies pain or growth at this time. Admits to sweating from waist down. Denies any fevers. Admits to tremors for over a year now after two grand mal seizures. She has not had a seizure since.   REVIEW OF SYSTEMS:  A complete 10 point review of system is done which is negative except mentioned above/history of present illness.   PAST MEDICAL HISTORY :  Past Medical History:  Diagnosis Date  . Cancer [facility])    hodgkins lymphoma dx 2011 chemo and rad tx finished in 2012  . COPD (chronic obstructive pulmonary disease) ([place])   . Degenerative disc disease, cervical   . Depression   . Diabetes mellitus ([place])   . GERD (gastroesophageal reflux disease)   . Hypertension   . Polio   . Postpolio syndrome    left leg paralysis    PAST SURGICAL HISTORY :   Past Surgical History:  Procedure Laterality Date  . ABDOMINAL HYSTERECTOMY    . BLADDER SURGERY    . BREAST LUMPECTOMY    . CARPAL TUNNEL RELEASE  2004   Bilateral  . CHOLECYSTECTOMY    . RECTAL PROLAPSE REPAIR  April 2010    FAMILY HISTORY :   Family History  Problem Relation Age of Onset  . Cancer Father   . Cancer Paternal Grandmother   . Diabetes Mother   . Emphysema Mother     SOCIAL HISTORY:   Social History  Substance Use Topics  . Smoking status: Former Smoker     Packs/day: 1.00    Years: 56.00    Types: Cigarettes  . Smokeless tobacco: Never Used     Comment: quit smoking 11/13/15  . Alcohol use No    ALLERGIES:  is allergic to prednisone and trazodone.  MEDICATIONS:  Current Outpatient Prescriptions  Medication Sig Dispense Refill  . albuterol (PROVENTIL HFA;VENTOLIN HFA) 108 (90 Base) MCG/ACT inhaler Inhale 2 puffs into the lungs every 4 (four) hours as needed for wheezing or shortness of breath.    Marland Kitchen aspirin EC 81 MG tablet Take 81 mg by mouth daily.    Marland Kitchen b complex vitamins tablet Take 1 tablet by mouth daily.    . cholecalciferol (VITAMIN D) 1000 units tablet Take 1,000 Units by mouth daily.    . clonazePAM (KLONOPIN) 0.5 MG tablet Take 1 tablet (0.5 mg total) by mouth 2 (two) times daily as needed for anxiety. 15 tablet 0  . divalproex (DEPAKOTE) 250 MG DR tablet Take 250 mg by mouth 2 (two) times daily.     . Fluticasone-Salmeterol (ADVAIR DISKUS) 250-50 MCG/DOSE AEPB Inhale 1 puff into the lungs 2 (two) times daily. 60 each   . losartan (COZAAR) 100 MG tablet Take 100 mg by mouth daily.     . metoprolol tartrate (LOPRESSOR) 25 MG tablet Take 0.5 tablets (12.5 mg total)  by mouth 2 (two) times daily. 60 tablet 0  . mirabegron ER (MYRBETRIQ) 50 MG TB24 tablet Take 50 mg by mouth at bedtime.     . benzonatate (TESSALON) 100 MG capsule Take 1 capsule (100 mg total) by mouth 3 (three) times daily as needed for cough. (Patient not taking: Reported on 07/15/2016) 20 capsule 0  . ipratropium-albuterol (DUONEB) 0.5-2.5 (3) MG/3ML SOLN Take 3 mLs by nebulization every 4 (four) hours as needed. (Patient not taking: Reported on 07/15/2016) 360 mL   . levothyroxine (SYNTHROID, LEVOTHROID) 75 MCG tablet     . PARoxetine (PAXIL) 20 MG tablet      No current facility-administered medications for this visit.     PHYSICAL EXAMINATION:   BP (!) 146/84 (BP Location: Right Arm, Patient Position: Sitting)   Pulse 62   Temp 97.7 F (36.5 C) (Tympanic)   Wt  128 lb (58.1 kg)   BMI 25.00 kg/m   Filed Weights   07/15/16 1522  Weight: 128 lb (58.1 kg)    GENERAL: Thin built moderately nourished. Alert, no distress and comfortable.   Accompanied by her daughter. Patient has tremors of her head /upper extremity [ chronic] EYES: no pallor or icterus OROPHARYNX: no thrush or ulceration; poor dentition  NECK: supple, no masses felt LYMPH:  no palpable lymphadenopathy in the cervical, axillary or inguinal regions LUNGS: clear to auscultation and  No wheeze or crackles HEART/CVS: regular rate & rhythm and no murmurs; No lower extremity edema ABDOMEN:abdomen soft, non-tender and normal bowel sounds Musculoskeletal:no cyanosis of digits;  Patient's left leg is in a brace [ Chronic]. Left mid-back under scapula lipoma noted. Non-tender. Non-movable.  PSYCH: alert & oriented x 3 with fluent speech NEURO: no focal motor/sensory deficits SKIN:  no rashes or significant lesions  LABORATORY DATA:  I have reviewed the data as listed    Component Value Date/Time   NA 136 07/15/2016 1430   NA 129 (L) 02/13/2014 1606   K 4.3 07/15/2016 1430   K 3.9 02/13/2014 1606   CL 103 07/15/2016 1430   CL 94 (L) 02/13/2014 1606   CO2 29 07/15/2016 1430   CO2 27 02/13/2014 1606   GLUCOSE 96 07/15/2016 1430   GLUCOSE 121 (H) 02/13/2014 1606   BUN 20 07/15/2016 1430   BUN 7 02/13/2014 1606   CREATININE 0.45 07/15/2016 1430   CREATININE 0.59 (L) 06/29/2014 1042   CALCIUM 9.0 07/15/2016 1430   CALCIUM 8.3 (L) 02/13/2014 1606   PROT 7.6 07/15/2016 1430   PROT 7.3 06/29/2014 1042   ALBUMIN 3.7 07/15/2016 1430   ALBUMIN 3.4 06/29/2014 1042   AST 24 07/15/2016 1430   AST 31 06/29/2014 1042   ALT 22 07/15/2016 1430   ALT 69 (H) 06/29/2014 1042   ALKPHOS 62 07/15/2016 1430   ALKPHOS 99 06/29/2014 1042   BILITOT 0.6 07/15/2016 1430   BILITOT 0.3 06/29/2014 1042   GFRNONAA >60 07/15/2016 1430   GFRNONAA >60 06/29/2014 1042   GFRNONAA >60 02/13/2014 1606    GFRAA >60 07/15/2016 1430   GFRAA >60 06/29/2014 1042   GFRAA >60 02/13/2014 1606    No results found for: SPEP, UPEP  Lab Results  Component Value Date   WBC 6.3 07/15/2016   NEUTROABS 3.8 07/15/2016   HGB 15.0 07/15/2016   HCT 43.1 07/15/2016   MCV 96.5 07/15/2016   PLT 184 07/15/2016      Chemistry      Component Value Date/Time   NA 136 07/15/2016  1430   NA 129 (L) 02/13/2014 1606   K 4.3 07/15/2016 1430   K 3.9 02/13/2014 1606   CL 103 07/15/2016 1430   CL 94 (L) 02/13/2014 1606   CO2 29 07/15/2016 1430   CO2 27 02/13/2014 1606   BUN 20 07/15/2016 1430   BUN 7 02/13/2014 1606   CREATININE 0.45 07/15/2016 1430   CREATININE 0.59 (L) 06/29/2014 1042      Component Value Date/Time   CALCIUM 9.0 07/15/2016 1430   CALCIUM 8.3 (L) 02/13/2014 1606   ALKPHOS 62 07/15/2016 1430   ALKPHOS 99 06/29/2014 1042   AST 24 07/15/2016 1430   AST 31 06/29/2014 1042   ALT 22 07/15/2016 1430   ALT 69 (H) 06/29/2014 1042   BILITOT 0.6 07/15/2016 1430   BILITOT 0.3 06/29/2014 1042       RADIOGRAPHIC STUDIES: I have personally reviewed the radiological images as listed and agreed with the findings in the report. No results found.   ASSESSMENT & PLAN:   Lymphoma  Hodgkin's lymphoma stage II- [ finish 2011];  Clinically no evidence of recurrence.  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  #  Weight loss: Holding steady. No weight loss at this time. Monitor.  #  Active smoker-  Recommended quitting;  Patient states she is trying to quit. She is smoker fewer cigarettes daily.   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 Neurology following. Medications aren't helpful.  # Lipoma on left middle back under scapula: Non-painful. Noticed about two weeks ago. Monitor.   # Follow-up one year labs (CBC, CMET)/MD   Faythe Casa, NP   Jacquelin Hawking,  NP 07/15/2016 4:07 PM

## 2016-07-15 NOTE — Progress Notes (Signed)
Patient here today for follow up.  Patient has a new "growth/knot under skin" on the left side of mid back.  Patient c/o new visual changes, patient describes as a black thin hair like floating

## 2016-08-19 ENCOUNTER — Encounter: Payer: Self-pay | Admitting: *Deleted

## 2016-08-20 ENCOUNTER — Telehealth: Payer: Self-pay | Admitting: *Deleted

## 2016-08-20 DIAGNOSIS — Z87891 Personal history of nicotine dependence: Secondary | ICD-10-CM

## 2016-08-20 NOTE — Telephone Encounter (Signed)
Received referral for initial lung cancer screening scan. Contacted patient and obtained smoking history,(current, 57 pack year) as well as answering questions related to screening process. Patient denies signs of lung cancer such as weight loss or hemoptysis. Patient denies comorbidity that would prevent curative treatment if lung cancer were found. Patient is tentatively scheduled for shared decision making visit and CT scan on 08/26/16, pending insurance approval from business office.

## 2016-08-26 ENCOUNTER — Inpatient Hospital Stay: Payer: Medicare Other | Admitting: Oncology

## 2016-08-26 ENCOUNTER — Ambulatory Visit: Payer: Medicare Other

## 2016-09-02 ENCOUNTER — Inpatient Hospital Stay: Payer: Medicare Other | Attending: Oncology | Admitting: Oncology

## 2016-09-02 ENCOUNTER — Encounter: Payer: Self-pay | Admitting: Oncology

## 2016-09-02 ENCOUNTER — Ambulatory Visit
Admission: RE | Admit: 2016-09-02 | Discharge: 2016-09-02 | Disposition: A | Discharge status: Home / Self Care | Payer: Medicare Other | Source: Ambulatory Visit | Attending: Oncology | Admitting: Oncology

## 2016-09-02 DIAGNOSIS — Z87891 Personal history of nicotine dependence: Secondary | ICD-10-CM | POA: Insufficient documentation

## 2016-09-02 DIAGNOSIS — Z122 Encounter for screening for malignant neoplasm of respiratory organs: Secondary | ICD-10-CM

## 2016-09-02 NOTE — Progress Notes (Signed)
Personal history of tobacco use, presenting hazards to health In accordance with CMS guidelines, patient has met eligibility criteria including age, absence of signs or symptoms of lung cancer.  Social History  Substance Use Topics  . Smoking status: Former Smoker    Packs/day: 1.00    Years: 57.00    Types: Cigarettes  . Smokeless tobacco: Never Used     Comment: quit smoking 11/13/15  . Alcohol use No     A shared decision-making session was conducted prior to the performance of CT scan. This includes one or more decision aids, includes benefits and harms of screening, follow-up diagnostic testing, over-diagnosis, false positive rate, and total radiation exposure.  Counseling on the importance of adherence to annual lung cancer LDCT screening, impact of co-morbidities, and ability or willingness to undergo diagnosis and treatment is imperative for compliance of the program.  Counseling on the importance of continued smoking cessation for former smokers; the importance of smoking cessation for current smokers, and information about tobacco cessation interventions have been given to patient including [place] and 1800 quit [place] programs.  Written order for lung cancer screening with LDCT has been given to the patient and any and all questions have been answered to the best of my abilities.   Yearly follow up will be coordinated by Burgess Estelle, Thoracic Navigator.   Lucendia Herrlich, NP  09/02/16 2:57 PM

## 2016-09-02 NOTE — Assessment & Plan Note (Signed)
In accordance with CMS guidelines, patient has met eligibility criteria including age, absence of signs or symptoms of lung cancer.  Social History  Substance Use Topics  . Smoking status: Former Smoker    Packs/day: 1.00    Years: 57.00    Types: Cigarettes  . Smokeless tobacco: Never Used     Comment: quit smoking 11/13/15  . Alcohol use No     A shared decision-making session was conducted prior to the performance of CT scan. This includes one or more decision aids, includes benefits and harms of screening, follow-up diagnostic testing, over-diagnosis, false positive rate, and total radiation exposure.  Counseling on the importance of adherence to annual lung cancer LDCT screening, impact of co-morbidities, and ability or willingness to undergo diagnosis and treatment is imperative for compliance of the program.  Counseling on the importance of continued smoking cessation for former smokers; the importance of smoking cessation for current smokers, and information about tobacco cessation interventions have been given to patient including [place] and 1800 quit [place] programs.  Written order for lung cancer screening with LDCT has been given to the patient and any and all questions have been answered to the best of my abilities.   Yearly follow up will be coordinated by Burgess Estelle, Thoracic Navigator.

## 2016-09-04 ENCOUNTER — Telehealth: Payer: Self-pay | Admitting: *Deleted

## 2016-09-04 NOTE — Telephone Encounter (Signed)
Notified patient of LDCT lung cancer screening results with recommendation for 12 month follow up imaging. Also notified of incidental finding noted in the body of the report including emphysema and coronary artery atheroslerosis. Patient verbalizes understanding.    IMPRESSION: Lung-RADS Category 2, benign appearance or behavior. Continue annual screening with low-dose chest CT without contrast in 12 months.

## 2016-10-28 ENCOUNTER — Ambulatory Visit (INDEPENDENT_AMBULATORY_CARE_PROVIDER_SITE_OTHER): Payer: Medicare Other | Admitting: Pulmonary Disease

## 2016-10-28 ENCOUNTER — Encounter: Payer: Self-pay | Admitting: Pulmonary Disease

## 2016-10-28 VITALS — BP 118/68 | HR 66 | Ht 60.0 in | Wt 121.0 lb

## 2016-10-28 DIAGNOSIS — F17200 Nicotine dependence, unspecified, uncomplicated: Secondary | ICD-10-CM

## 2016-10-28 DIAGNOSIS — J449 Chronic obstructive pulmonary disease, unspecified: Secondary | ICD-10-CM

## 2016-10-28 NOTE — Patient Instructions (Signed)
Smoking cessation as we discussed Use the nebulizer as needed for shortness of breath, chest tightness, wheezing Follow-up as needed for increased breathing symptoms

## 2016-10-29 NOTE — Progress Notes (Signed)
PULMONARY OFFICE FOLLOW-UP  SUMMARY: 71 y.o. F smoker with post-polio syndrome, COPD hospitalized 05/29-06/05/17 for AECOPD. Seen in consultation by Dr Mortimer Fries. Discharged home on Advair, prednisone taper, PRN nebulized bronchodilators and O2.   CXR (11/15/15): no acute disease CT chest 07/11/15: moderate emphysema    PROBLEMS: COPD, emphysema Chronic hypoxic, hypercarbic respiratory failure Smoker  Post polio syndrome - at her baseline, this is more limiting than dyspnea  INTERVAL HISTORY: No major events  SUBJ: Routine re-eval. No new complaints. Has stopped using Advair without deterioration in her symptoms. Not using rescue MDI at all. IS very sedentary but denies DOE. Also denies CP, fever, purulent sputum, hemoptysis, LE edema and calf tenderness. Has resumed smoking, currently 1/2 PPD  OBJ: Vitals:   10/28/16 1629  BP: 118/68  Pulse: 66  SpO2: 95%  Weight: 121 lb (54.9 kg)  Height: 5' (1.524 m)    NAD, flat affect HEENT WNL No JVD Mildly decreased BS, no wheezes RRR s M NABS No C/C/E   DATA:   BMP Latest Ref Rng & Units 07/15/2016 11/20/2015 11/19/2015  Glucose 65 - 99 mg/dL 96 - -  BUN 6 - 20 mg/dL 20 - -  Creatinine 0.44 - 1.00 mg/dL 0.45 0.50 0.47  Sodium 135 - 145 mmol/L 136 - -  Potassium 3.5 - 5.1 mmol/L 4.3 - -  Chloride 101 - 111 mmol/L 103 - -  CO2 22 - 32 mmol/L 29 - -  Calcium 8.9 - 10.3 mg/dL 9.0 - -    CBC Latest Ref Rng & Units 07/15/2016 11/14/2015 11/13/2015  WBC 3.6 - 11.0 K/uL 6.3 10.0 14.5(H)  Hemoglobin 12.0 - 16.0 g/dL 15.0 15.0 17.0(H)  Hematocrit 35.0 - 47.0 % 43.1 44.8 49.0(H)  Platelets 150 - 440 K/uL 184 177 241   No new CXR   IMPRESSION:   1) COPD/emphysema, stable - did not get symptomatic improvement from ICS/LABA 2) Relapsed smoker  3) Severe physical disability due to post-polio symdrome  PLAN:  We again discussed smoking cessation, its benefits and strategies She may remain off Advair Continue albuterol (as MDI or in  nebulizer) as needed Follow up PRN  Merton Border, MD PCCM service Mobile [phone] Pager [phone] 10/29/2016

## 2016-12-09 ENCOUNTER — Telehealth: Payer: Self-pay | Admitting: Pulmonary Disease

## 2016-12-09 NOTE — Telephone Encounter (Signed)
Patient needs medical necessity for oxygen she has been getting through [facility].

## 2016-12-09 NOTE — Telephone Encounter (Signed)
Patient's daughter states they were getting bills. When she called to inquire was told it was time for her mother to re-cert for 02.

## 2016-12-10 NOTE — Telephone Encounter (Signed)
Patient has been offered Friday 12/13/16 as a work in at [employer]. She has been notified that she must have office visit in order to recertify for 02.

## 2016-12-12 ENCOUNTER — Encounter: Payer: Self-pay | Admitting: *Deleted

## 2016-12-12 ENCOUNTER — Other Ambulatory Visit: Payer: Self-pay | Admitting: *Deleted

## 2016-12-13 ENCOUNTER — Ambulatory Visit (INDEPENDENT_AMBULATORY_CARE_PROVIDER_SITE_OTHER): Payer: Medicare Other | Admitting: Pulmonary Disease

## 2016-12-13 ENCOUNTER — Encounter: Payer: Self-pay | Admitting: Pulmonary Disease

## 2016-12-13 VITALS — BP 126/64 | HR 61 | Resp 16 | Ht 60.0 in | Wt 127.0 lb

## 2016-12-13 DIAGNOSIS — F17200 Nicotine dependence, unspecified, uncomplicated: Secondary | ICD-10-CM | POA: Diagnosis not present

## 2016-12-13 DIAGNOSIS — J449 Chronic obstructive pulmonary disease, unspecified: Secondary | ICD-10-CM

## 2016-12-13 DIAGNOSIS — G4734 Idiopathic sleep related nonobstructive alveolar hypoventilation: Secondary | ICD-10-CM

## 2016-12-13 NOTE — Progress Notes (Signed)
PULMONARY OFFICE FOLLOW-UP  SUMMARY: 71 y.o. F smoker with post-polio syndrome, COPD hospitalized 05/29-06/05/17 for AECOPD. Seen in consultation by Dr Mortimer Fries. Discharged home on Advair, prednisone taper, PRN nebulized bronchodilators and O2.   CXR (11/15/15): no acute disease CT chest 07/11/15: moderate emphysema    PROBLEMS: COPD, emphysema Chronic hypoxic, hypercarbic respiratory failure Smoker  Post polio syndrome - at her baseline, this is more limiting than dyspnea  INTERVAL HISTORY: No major events  SUBJ: Last seen in 10/28/16. Plan at that time was to follow-up as needed. She returns today to get reapproval for nocturnal oxygen therapy. Since her last encounter, she has suffered an acute sinusitis and COPD exacerbation. This was treated by her primary care physician with prednisone and antibiotics. She has resumed Advair. She is now nearly back to her baseline with minimal cough productive of white mucus.Denies CP, fever, purulent sputum, hemoptysis, LE edema and calf tenderness. She continues to smoke approximately one half pack cigarettes per day  OBJ: Vitals:   12/13/16 0900  BP: 126/64  Pulse: 61  Resp: 16  SpO2: 96%  Weight: 127 lb (57.6 kg)  Height: 5' (1.524 m)  RA  Gen: No respiratory distress HEENT: WNL Neck: NO LAN, no JVD noted Lungs: Slightly coarse breath sounds, no wheezes or other adventitious sounds Cardiovascular: Reg, no M Abdomen: Soft, NT, +BS Ext: no C/C/E, left lower extremity in brace Neuro: Coarse tremor Skin: No lesions noted  DATA:   BMP Latest Ref Rng & Units 07/15/2016 11/20/2015 11/19/2015  Glucose 65 - 99 mg/dL 96 - -  BUN 6 - 20 mg/dL 20 - -  Creatinine 0.44 - 1.00 mg/dL 0.45 0.50 0.47  Sodium 135 - 145 mmol/L 136 - -  Potassium 3.5 - 5.1 mmol/L 4.3 - -  Chloride 101 - 111 mmol/L 103 - -  CO2 22 - 32 mmol/L 29 - -  Calcium 8.9 - 10.3 mg/dL 9.0 - -    CBC Latest Ref Rng & Units 07/15/2016 11/14/2015 11/13/2015  WBC 3.6 - 11.0 K/uL 6.3  10.0 14.5(H)  Hemoglobin 12.0 - 16.0 g/dL 15.0 15.0 17.0(H)  Hematocrit 35.0 - 47.0 % 43.1 44.8 49.0(H)  Platelets 150 - 440 K/uL 184 177 241   No new CXR   IMPRESSION:   1) COPD/emphysema - recent exacerbation treated by primary physician with antibiotics and prednisone 2) Recalcitrant smoker  3) Post-polio syndrome - this is her main limiting problem 4) history of nocturnal hypoxemia  PLAN:  I again counseled her regarding smoking cessation Continue Advair 250/50 - one inhalation twice a day ONO ordered on room air Follow up 3 months  Merton Border, MD PCCM service Mobile [phone] Pager [phone] 12/13/2016

## 2016-12-13 NOTE — Patient Instructions (Signed)
Smoking cessation as we discussed Continue Advair Overnight oximetry on room air has been ordered Follow-up in 3 months or sooner as needed

## 2016-12-19 ENCOUNTER — Encounter: Payer: Self-pay | Admitting: Pulmonary Disease

## 2016-12-19 DIAGNOSIS — J449 Chronic obstructive pulmonary disease, unspecified: Secondary | ICD-10-CM

## 2016-12-19 DIAGNOSIS — G4734 Idiopathic sleep related nonobstructive alveolar hypoventilation: Secondary | ICD-10-CM

## 2016-12-20 ENCOUNTER — Telehealth: Payer: Self-pay | Admitting: Pulmonary Disease

## 2016-12-20 NOTE — Telephone Encounter (Signed)
Pt states she was told to call to get ONO machine picked up. Pt informed she needs to contact [place] since that is who brought it out to her. Nothing further needed.

## 2016-12-20 NOTE — Telephone Encounter (Signed)
Pt calling stating she would like [place] to place an order for her to get a pick up on the oximeter machine she has at home  Please call patient once we have done this

## 2017-01-17 ENCOUNTER — Telehealth: Payer: Self-pay | Admitting: *Deleted

## 2017-01-17 NOTE — Telephone Encounter (Signed)
LMTCB with spouse. ONO normal No need for 02 at this time.

## 2017-02-24 ENCOUNTER — Encounter: Payer: Self-pay | Admitting: *Deleted

## 2017-02-24 ENCOUNTER — Inpatient Hospital Stay
Admission: EM | Admit: 2017-02-24 | Discharge: 2017-02-28 | DRG: 603 | Disposition: A | Discharge status: Home / Self Care | Payer: Medicare Other | Attending: Specialist | Admitting: Specialist

## 2017-02-24 DIAGNOSIS — L03113 Cellulitis of right upper limb: Secondary | ICD-10-CM | POA: Diagnosis not present

## 2017-02-24 DIAGNOSIS — K219 Gastro-esophageal reflux disease without esophagitis: Secondary | ICD-10-CM | POA: Diagnosis present

## 2017-02-24 DIAGNOSIS — R609 Edema, unspecified: Secondary | ICD-10-CM

## 2017-02-24 DIAGNOSIS — F329 Major depressive disorder, single episode, unspecified: Secondary | ICD-10-CM | POA: Diagnosis present

## 2017-02-24 DIAGNOSIS — J449 Chronic obstructive pulmonary disease, unspecified: Secondary | ICD-10-CM | POA: Diagnosis present

## 2017-02-24 DIAGNOSIS — L0390 Cellulitis, unspecified: Secondary | ICD-10-CM | POA: Diagnosis present

## 2017-02-24 DIAGNOSIS — Z9221 Personal history of antineoplastic chemotherapy: Secondary | ICD-10-CM

## 2017-02-24 DIAGNOSIS — Z825 Family history of asthma and other chronic lower respiratory diseases: Secondary | ICD-10-CM

## 2017-02-24 DIAGNOSIS — G14 Postpolio syndrome: Secondary | ICD-10-CM | POA: Diagnosis present

## 2017-02-24 DIAGNOSIS — Z79899 Other long term (current) drug therapy: Secondary | ICD-10-CM

## 2017-02-24 DIAGNOSIS — Z23 Encounter for immunization: Secondary | ICD-10-CM

## 2017-02-24 DIAGNOSIS — Z8571 Personal history of Hodgkin lymphoma: Secondary | ICD-10-CM

## 2017-02-24 DIAGNOSIS — Z90710 Acquired absence of both cervix and uterus: Secondary | ICD-10-CM

## 2017-02-24 DIAGNOSIS — A280 Pasteurellosis: Secondary | ICD-10-CM | POA: Diagnosis present

## 2017-02-24 DIAGNOSIS — R569 Unspecified convulsions: Secondary | ICD-10-CM | POA: Diagnosis present

## 2017-02-24 DIAGNOSIS — Z91040 Latex allergy status: Secondary | ICD-10-CM

## 2017-02-24 DIAGNOSIS — E7800 Pure hypercholesterolemia, unspecified: Secondary | ICD-10-CM | POA: Diagnosis present

## 2017-02-24 DIAGNOSIS — F419 Anxiety disorder, unspecified: Secondary | ICD-10-CM | POA: Diagnosis present

## 2017-02-24 DIAGNOSIS — Z9049 Acquired absence of other specified parts of digestive tract: Secondary | ICD-10-CM

## 2017-02-24 DIAGNOSIS — E119 Type 2 diabetes mellitus without complications: Secondary | ICD-10-CM

## 2017-02-24 DIAGNOSIS — Z888 Allergy status to other drugs, medicaments and biological substances status: Secondary | ICD-10-CM

## 2017-02-24 DIAGNOSIS — I10 Essential (primary) hypertension: Secondary | ICD-10-CM | POA: Diagnosis present

## 2017-02-24 DIAGNOSIS — E034 Atrophy of thyroid (acquired): Secondary | ICD-10-CM | POA: Diagnosis present

## 2017-02-24 DIAGNOSIS — F17210 Nicotine dependence, cigarettes, uncomplicated: Secondary | ICD-10-CM | POA: Diagnosis present

## 2017-02-24 DIAGNOSIS — Z923 Personal history of irradiation: Secondary | ICD-10-CM

## 2017-02-24 DIAGNOSIS — W5501XA Bitten by cat, initial encounter: Secondary | ICD-10-CM | POA: Diagnosis present

## 2017-02-24 DIAGNOSIS — Z7982 Long term (current) use of aspirin: Secondary | ICD-10-CM

## 2017-02-24 DIAGNOSIS — Z8612 Personal history of poliomyelitis: Secondary | ICD-10-CM

## 2017-02-24 DIAGNOSIS — Z833 Family history of diabetes mellitus: Secondary | ICD-10-CM

## 2017-02-24 DIAGNOSIS — B9689 Other specified bacterial agents as the cause of diseases classified elsewhere: Secondary | ICD-10-CM | POA: Diagnosis present

## 2017-02-24 LAB — CBC WITH DIFFERENTIAL/PLATELET
BASOS ABS: 0.1 10*3/uL (ref 0–0.1)
BASOS PCT: 0 %
Eosinophils Absolute: 0 10*3/uL (ref 0–0.7)
Eosinophils Relative: 0 %
HEMATOCRIT: 44.3 % (ref 35.0–47.0)
HEMOGLOBIN: 15.3 g/dL (ref 12.0–16.0)
LYMPHS PCT: 5 %
Lymphs Abs: 0.9 10*3/uL — ABNORMAL LOW (ref 1.0–3.6)
MCH: 33.3 pg (ref 26.0–34.0)
MCHC: 34.5 g/dL (ref 32.0–36.0)
MCV: 96.3 fL (ref 80.0–100.0)
MONO ABS: 1.9 10*3/uL — AB (ref 0.2–0.9)
Monocytes Relative: 9 %
NEUTROS ABS: 17.9 10*3/uL — AB (ref 1.4–6.5)
Neutrophils Relative %: 86 %
Platelets: 166 10*3/uL (ref 150–440)
RBC: 4.6 MIL/uL (ref 3.80–5.20)
RDW: 13.9 % (ref 11.5–14.5)
WBC: 20.9 10*3/uL — AB (ref 3.6–11.0)

## 2017-02-24 LAB — BASIC METABOLIC PANEL
ANION GAP: 7 (ref 5–15)
BUN: 17 mg/dL (ref 6–20)
CALCIUM: 8.9 mg/dL (ref 8.9–10.3)
CO2: 27 mmol/L (ref 22–32)
Chloride: 104 mmol/L (ref 101–111)
Creatinine, Ser: 0.61 mg/dL (ref 0.44–1.00)
GLUCOSE: 150 mg/dL — AB (ref 65–99)
POTASSIUM: 3.8 mmol/L (ref 3.5–5.1)
Sodium: 138 mmol/L (ref 135–145)

## 2017-02-24 MED ORDER — SODIUM CHLORIDE 0.9 % IV BOLUS (SEPSIS)
1000.0000 mL | Freq: Once | INTRAVENOUS | Status: AC
Start: 2017-02-24 — End: 2017-02-25
  Administered 2017-02-24: 1000 mL via INTRAVENOUS

## 2017-02-24 MED ORDER — SODIUM CHLORIDE 0.9 % IV SOLN
3.0000 g | Freq: Once | INTRAVENOUS | Status: AC
  Administered 2017-02-24: 3 g via INTRAVENOUS
  Filled 2017-02-24: qty 3

## 2017-02-24 NOTE — H&P (Signed)
[place] [facility] [place] NAME: [name]    MR#:  [id number]  DATE OF BIRTH:  [birth year]  DATE OF ADMISSION:  02/24/2017  PRIMARY CARE PHYSICIAN: Madelyn Brunner, MD   REQUESTING/REFERRING PHYSICIAN: Cherylann Banas, MD  CHIEF COMPLAINT:   Chief Complaint  Patient presents with  . Animal Bite    HISTORY OF PRESENT ILLNESS:  [name]  is a 71 y.o. female who presents with cat bite and subsequent cellulitis. Patient was bitten on her right arm by her Yesterday, and last night and today developed significant redness and swelling of her hand with streaking redness tracking up her right arm. She has an elevated white blood cell count. hospitalists were called for admission and fur treatment  PAST MEDICAL HISTORY:   Past Medical History:  Diagnosis Date  . Cancer [facility]    hodgkins lymphoma dx 2011 chemo and rad tx finished in 2012  . COPD (chronic obstructive pulmonary disease) ([place])   . Degenerative disc disease, cervical   . Depression   . Diabetes mellitus ([place])   . GERD (gastroesophageal reflux disease)   . Hypertension   . Polio   . Postpolio syndrome    left leg paralysis    PAST SURGICAL HISTORY:   Past Surgical History:  Procedure Laterality Date  . ABDOMINAL HYSTERECTOMY    . BLADDER SURGERY    . BREAST LUMPECTOMY    . CARPAL TUNNEL RELEASE  2004   Bilateral  . CHOLECYSTECTOMY    . RECTAL PROLAPSE REPAIR  April 2010    SOCIAL HISTORY:   Social History  Substance Use Topics  . Smoking status: Current Every Day Smoker    Packs/day: 0.50    Years: 57.00    Types: Cigarettes  . Smokeless tobacco: Never Used     Comment: quit smoking 11/13/15  . Alcohol use No    FAMILY HISTORY:   Family History  Problem Relation Age of Onset  . Cancer Father   . Cancer Paternal Grandmother   . Diabetes Mother   . Emphysema Mother     DRUG ALLERGIES:   Allergies  Allergen Reactions  . Prednisone Rash  . Trazodone  Rash    MEDICATIONS AT HOME:   Prior to Admission medications   Medication Sig Start Date End Date Taking? Authorizing Provider  albuterol (PROVENTIL HFA;VENTOLIN HFA) 108 (90 Base) MCG/ACT inhaler Inhale 2 puffs into the lungs every 4 (four) hours as needed for wheezing or shortness of breath.   Yes [provider]  aspirin EC 81 MG tablet Take 81 mg by mouth daily.   Yes [provider]  b complex vitamins tablet Take 1 tablet by mouth daily.   Yes [provider]  cholecalciferol (VITAMIN D) 1000 units tablet Take 1,000 Units by mouth daily.   Yes [provider]  clonazePAM (KLONOPIN) 0.5 MG tablet Take 1 tablet (0.5 mg total) by mouth 2 (two) times daily as needed for anxiety. 11/20/15  Yes Sudini, Alveta Heimlich, MD  divalproex (DEPAKOTE) 250 MG DR tablet Take 250 mg by mouth 2 (two) times daily.    Yes [provider]  Fluticasone-Salmeterol (ADVAIR DISKUS) 250-50 MCG/DOSE AEPB Inhale 1 puff into the lungs 2 (two) times daily. 11/20/15  Yes Sudini, Alveta Heimlich, MD  ipratropium-albuterol (DUONEB) 0.5-2.5 (3) MG/3ML SOLN Take 3 mLs by nebulization every 4 (four) hours as needed. 11/20/15  Yes Sudini, Alveta Heimlich, MD  levothyroxine (SYNTHROID, LEVOTHROID) 75 MCG tablet Take 75  mcg by mouth daily before breakfast.  07/10/16  Yes [provider]  losartan (COZAAR) 100 MG tablet Take 100 mg by mouth daily.    Yes [provider]  metoprolol tartrate (LOPRESSOR) 25 MG tablet Take 0.5 tablets (12.5 mg total) by mouth 2 (two) times daily. 11/20/15  Yes Sudini, Alveta Heimlich, MD  mirabegron ER (MYRBETRIQ) 50 MG TB24 tablet Take 50 mg by mouth at bedtime.    Yes [provider]  PARoxetine (PAXIL) 20 MG tablet Take 20 mg by mouth daily.  07/05/16  Yes [provider]  senna-docusate (SENOKOT-S) 8.6-50 MG tablet Take 2 tablets by mouth daily as needed.   Yes [provider]    REVIEW OF SYSTEMS:  Review of Systems  Constitutional: Negative  for chills, fever, malaise/fatigue and weight loss.  HENT: Negative for ear pain, hearing loss and tinnitus.   Eyes: Negative for blurred vision, double vision, pain and redness.  Respiratory: Negative for cough, hemoptysis and shortness of breath.   Cardiovascular: Negative for chest pain, palpitations, orthopnea and leg swelling.  Gastrointestinal: Negative for abdominal pain, constipation, diarrhea, nausea and vomiting.  Genitourinary: Negative for dysuria, frequency and hematuria.  Musculoskeletal: Negative for back pain, joint pain and neck pain.  Skin:       Erythema swelling warmth and tenderness of her right arm  Neurological: Negative for dizziness, tremors, focal weakness and weakness.  Endo/Heme/Allergies: Negative for polydipsia. Does not bruise/bleed easily.  Psychiatric/Behavioral: Negative for depression. The patient is not nervous/anxious and does not have insomnia.      VITAL SIGNS:   Vitals:   02/24/17 2223 02/24/17 2224 02/24/17 2338  BP: (!) 157/95  109/65  Pulse: 94  79  Resp: 20  20  Temp: 98.4 F (36.9 C)    TempSrc: Oral    SpO2: 99%  95%  Weight:  57.6 kg (127 lb)   Height:  5' (1.524 m)    Wt Readings from Last 3 Encounters:  02/24/17 57.6 kg (127 lb)  12/13/16 57.6 kg (127 lb)  10/28/16 54.9 kg (121 lb)    PHYSICAL EXAMINATION:  Physical Exam  Vitals reviewed. Constitutional: She is oriented to person, place, and time. She appears well-developed and well-nourished. No distress.  HENT:  Head: Normocephalic and atraumatic.  Mouth/Throat: Oropharynx is clear and moist.  Eyes: Pupils are equal, round, and reactive to light. Conjunctivae and EOM are normal. No scleral icterus.  Neck: Normal range of motion. Neck supple. No JVD present. No thyromegaly present.  Cardiovascular: Normal rate, regular rhythm and intact distal pulses.  Exam reveals no gallop and no friction rub.   No murmur heard. Respiratory: Effort normal and breath sounds normal. No  respiratory distress. She has no wheezes. She has no rales.  GI: Soft. Bowel sounds are normal. She exhibits no distension. There is no tenderness.  Musculoskeletal: Normal range of motion. She exhibits no edema.  No arthritis, no gout  Lymphadenopathy:    She has no cervical adenopathy.  Neurological: She is alert and oriented to person, place, and time. No cranial nerve deficit.  No dysarthria, no aphasia  Skin: Skin is warm and dry. No rash noted. There is erythema (with swelling warmth and tenderness right arm).  Psychiatric: She has a normal mood and affect. Her behavior is normal. Judgment and thought content normal.    LABORATORY PANEL:   CBC  Recent Labs Lab 02/24/17 2254  WBC 20.9*  HGB 15.3  HCT 44.3  PLT 166   ------------------------------------------------------------------------------------------------------------------  Chemistries   Recent Labs Lab 02/24/17 2254  NA 138  K 3.8  CL 104  CO2 27  GLUCOSE 150*  BUN 17  CREATININE 0.61  CALCIUM 8.9   ------------------------------------------------------------------------------------------------------------------  Cardiac Enzymes No results for input(s): TROPONINI in the last 168 hours. ------------------------------------------------------------------------------------------------------------------  RADIOLOGY:  No results found.  EKG:   Orders placed or performed during the hospital encounter of 11/13/15  . ED EKG  . ED EKG  . EKG 12-Lead  . EKG 12-Lead    IMPRESSION AND PLAN:  Principal Problem:   Pasteurella cellulitis due to cat bite - IV Unasyn in Place, when necessary analgesia Active Problems:   Chronic obstructive airway disease with asthma ([place]) - home dose inhalers   Diabetes mellitus type 2, controlled (HCC) - sliding scale insulin with corresponding glucose checks   Benign essential hypertension - stable, continue home meds   Esophageal reflux - home dose PPI   Hypothyroidism  due to acquired atrophy of thyroid - home dose thyroid replacement  All the records are reviewed and case discussed with ED provider. Management plans discussed with the patient and/or family.  DVT PROPHYLAXIS: SubQ lovenox  GI PROPHYLAXIS: PPI  ADMISSION STATUS: Observation  CODE STATUS: Full Code Status History    Date Active Date Inactive Code Status Order ID Comments User Context   11/13/2015  3:55 PM 11/20/2015  8:27 PM Full Code [id number]  Bettey Costa, MD ED      TOTAL TIME TAKING CARE OF THIS PATIENT: 40 minutes.   Jannifer Franklin, [place] 02/24/2017, 11:46 PM  [facility]  Office  [phone]  CC: Primary care physician; Madelyn Brunner, MD  Note:  This document was prepared using Dragon voice recognition software and Jennifer include unintentional dictation errors.

## 2017-02-24 NOTE — ED Notes (Signed)
Cat bite reported to [place], PD via [facility].

## 2017-02-24 NOTE — ED Triage Notes (Signed)
Pt states her cat bit her right forearm yesterday.  Today pt has redness and swelling to right hand and right arm.  Pt alert.

## 2017-02-24 NOTE — ED Provider Notes (Signed)
[facility] Emergency Department Provider Note ____________________________________________   First MD Initiated Contact with Patient 02/24/17 2240     (approximate)  I have reviewed the triage vital signs and the nursing notes.   HISTORY  Chief Complaint Animal Bite    HPI [name] is a 71 y.o. female with past medical history as below who presents with cat bite to right forearm, acute onset approximately 24 hours ago, associated with pain, redness, and swelling to the hand and forearm as well as streaking up the right arm. Patient reports some chills but denies fever. No other associated injuries.  Past Medical History:  Diagnosis Date  . Cancer [facility])    hodgkins lymphoma dx 2011 chemo and rad tx finished in 2012  . COPD (chronic obstructive pulmonary disease) ([place])   . Degenerative disc disease, cervical   . Depression   . Diabetes mellitus ([place])   . GERD (gastroesophageal reflux disease)   . Hypertension   . Polio   . Postpolio syndrome    left leg paralysis    Patient Active Problem List   Diagnosis Date Noted  . Personal history of tobacco use, presenting hazards to health 09/02/2016  . Malnutrition of moderate degree 11/16/2015  . COPD exacerbation ([place]) 11/13/2015  . Right ankle pain 02/01/2015  . Hypothyroidism due to acquired atrophy of thyroid 01/17/2015  . Microalbuminuria 01/17/2015  . Lymphoma ([place]) 12/13/2014  . Tremor 07/04/2014  . Difficulty in walking 02/15/2014  . Fatigue 02/15/2014  . Seizures ([place]) 02/07/2014  . Sleep disorder 02/07/2014  . Chronic obstructive airway disease with asthma ([place]) 12/06/2013  . Diabetes mellitus type 2, controlled ([place]) 12/06/2013  . Esophageal reflux 12/06/2013  . Benign essential hypertension 12/06/2013  . History of lymphoma 12/06/2013  . Hypercholesterolemia 12/06/2013  . Post-polio syndrome 12/06/2013    Past Surgical History:  Procedure Laterality Date  . ABDOMINAL  HYSTERECTOMY    . BLADDER SURGERY    . BREAST LUMPECTOMY    . CARPAL TUNNEL RELEASE  2004   Bilateral  . CHOLECYSTECTOMY    . RECTAL PROLAPSE REPAIR  April 2010    Prior to Admission medications   Medication Sig Start Date End Date Taking? Authorizing Provider  albuterol (PROVENTIL HFA;VENTOLIN HFA) 108 (90 Base) MCG/ACT inhaler Inhale 2 puffs into the lungs every 4 (four) hours as needed for wheezing or shortness of breath.   Yes [provider]  aspirin EC 81 MG tablet Take 81 mg by mouth daily.   Yes [provider]  b complex vitamins tablet Take 1 tablet by mouth daily.   Yes [provider]  cholecalciferol (VITAMIN D) 1000 units tablet Take 1,000 Units by mouth daily.   Yes [provider]  clonazePAM (KLONOPIN) 0.5 MG tablet Take 1 tablet (0.5 mg total) by mouth 2 (two) times daily as needed for anxiety. 11/20/15  Yes Sudini, Alveta Heimlich, MD  divalproex (DEPAKOTE) 250 MG DR tablet Take 250 mg by mouth 2 (two) times daily.    Yes [provider]  Fluticasone-Salmeterol (ADVAIR DISKUS) 250-50 MCG/DOSE AEPB Inhale 1 puff into the lungs 2 (two) times daily. 11/20/15  Yes Sudini, Alveta Heimlich, MD  ipratropium-albuterol (DUONEB) 0.5-2.5 (3) MG/3ML SOLN Take 3 mLs by nebulization every 4 (four) hours as needed. 11/20/15  Yes Hillary Bow, MD  levothyroxine (SYNTHROID, LEVOTHROID) 75 MCG tablet Take 75 mcg by mouth daily before breakfast.  07/10/16  Yes [provider]  losartan (COZAAR) 100 MG tablet Take 100  mg by mouth daily.    Yes [provider]  metoprolol tartrate (LOPRESSOR) 25 MG tablet Take 0.5 tablets (12.5 mg total) by mouth 2 (two) times daily. 11/20/15  Yes Sudini, Alveta Heimlich, MD  mirabegron ER (MYRBETRIQ) 50 MG TB24 tablet Take 50 mg by mouth at bedtime.    Yes [provider]  PARoxetine (PAXIL) 20 MG tablet Take 20 mg by mouth daily.  07/05/16  Yes [provider]  senna-docusate (SENOKOT-S) 8.6-50 MG tablet Take 2  tablets by mouth daily as needed.   Yes [provider]  benzonatate (TESSALON) 100 MG capsule Take 1 capsule (100 mg total) by mouth 3 (three) times daily as needed for cough. Patient not taking: Reported on 02/24/2017 11/20/15   Hillary Bow, MD    Allergies Prednisone and Trazodone  Family History  Problem Relation Age of Onset  . Cancer Father   . Cancer Paternal Grandmother   . Diabetes Mother   . Emphysema Mother     Social History Social History  Substance Use Topics  . Smoking status: Current Every Day Smoker    Packs/day: 0.50    Years: 57.00    Types: Cigarettes  . Smokeless tobacco: Never Used     Comment: quit smoking 11/13/15  . Alcohol use No    Review of Systems  Constitutional: positive for chills Eyes: No visual changes. ENT: No sore throat. Cardiovascular: Denies chest pain. Respiratory: Denies shortness of breath. Gastrointestinal: No nausea, no vomiting.   Genitourinary: Negative for dysuria.  Musculoskeletal: Negative for back pain. Skin: positive for right forearm and hand swelling and rash. Neurological: Negative for headaches, focal weakness or numbness.   ____________________________________________   PHYSICAL EXAM:  VITAL SIGNS: ED Triage Vitals  Enc Vitals Group     BP 02/24/17 2223 (!) 157/95     Pulse Rate 02/24/17 2223 94     Resp 02/24/17 2223 20     Temp 02/24/17 2223 98.4 F (36.9 C)     Temp Source 02/24/17 2223 Oral     SpO2 02/24/17 2223 99 %     Weight 02/24/17 2224 127 lb (57.6 kg)     Height 02/24/17 2224 5' (1.524 m)     Head Circumference --      Peak Flow --      Pain Score 02/24/17 2222 10     Pain Loc --      Pain Edu? --      Excl. in [place]? --     Constitutional: Alert and oriented. Relatively comfortable appearing, no acute distress. Eyes: Conjunctivae are normal.  Head: Atraumatic. Nose: No congestion/rhinnorhea. Mouth/Throat: Mucous membranes are moist.   Neck: Normal range of motion.    Cardiovascular:  Good peripheral circulation. Respiratory: Normal respiratory effort.   Gastrointestinal: No distention.  Genitourinary: No CVA tenderness. Musculoskeletal: No lower extremity edema.  Extremities warm and well perfused. right forearm with erythema, tenderness, and mild swelling from approximately the proximal one third of the forearm distally towards the wrist and involving the hand. There is erythematous streaking up the right arm into the axilla. 2+ radial pulse, and motor and sensory are intact in median, ulnar, and radial distributions. Neurologic:  Normal speech and language. No gross focal neurologic deficits are appreciated.  Skin:  Skin is warm and dry.  Psychiatric: Mood and affect are normal. Speech and behavior are normal.  ____________________________________________   LABS (all labs ordered are listed, but only abnormal results are displayed)  Labs Reviewed  CULTURE, BLOOD (ROUTINE X 2)  CULTURE, BLOOD (ROUTINE X 2)  BASIC METABOLIC PANEL  CBC WITH DIFFERENTIAL/PLATELET   ____________________________________________  EKG   ____________________________________________  RADIOLOGY    ____________________________________________   PROCEDURES  Procedure(s) performed: No    Critical Care performed: No ____________________________________________   INITIAL IMPRESSION / ASSESSMENT AND PLAN / ED COURSE  Pertinent labs & imaging results that were available during my care of the patient were reviewed by me and considered in my medical decision making (see chart for details).  71 year old female presents with right arm cat bite yesterday, with ensuing swelling and erythematous rash to the distal two thirds of the forearm and hand, as well as streaking up the arm to the axilla. Patient reports chills but no other systemic symptoms. Presentation consistent with infected bite wound. Due to patient's age, comorbidities, and the extent of swelling and  rapid progression of symptoms I believe it is indicated for patient to be admitted for IV antibiotics.  Plan for basic labs, IV unasyn, fluids, and admit.         ____________________________________________   FINAL CLINICAL IMPRESSION(S) / ED DIAGNOSES  Final diagnoses:  Cat bite, initial encounter  Cellulitis of right upper extremity      NEW MEDICATIONS STARTED DURING THIS VISIT:  New Prescriptions   No medications on file     Note:  This document was prepared using Dragon voice recognition software and may include unintentional dictation errors.    Arta Silence, MD 02/24/17 2300

## 2017-02-25 DIAGNOSIS — Z90710 Acquired absence of both cervix and uterus: Secondary | ICD-10-CM | POA: Diagnosis not present

## 2017-02-25 DIAGNOSIS — F419 Anxiety disorder, unspecified: Secondary | ICD-10-CM | POA: Diagnosis present

## 2017-02-25 DIAGNOSIS — Z888 Allergy status to other drugs, medicaments and biological substances status: Secondary | ICD-10-CM | POA: Diagnosis not present

## 2017-02-25 DIAGNOSIS — Z23 Encounter for immunization: Secondary | ICD-10-CM | POA: Diagnosis present

## 2017-02-25 DIAGNOSIS — E7800 Pure hypercholesterolemia, unspecified: Secondary | ICD-10-CM | POA: Diagnosis present

## 2017-02-25 DIAGNOSIS — E034 Atrophy of thyroid (acquired): Secondary | ICD-10-CM | POA: Diagnosis present

## 2017-02-25 DIAGNOSIS — R569 Unspecified convulsions: Secondary | ICD-10-CM | POA: Diagnosis present

## 2017-02-25 DIAGNOSIS — K219 Gastro-esophageal reflux disease without esophagitis: Secondary | ICD-10-CM | POA: Diagnosis present

## 2017-02-25 DIAGNOSIS — J449 Chronic obstructive pulmonary disease, unspecified: Secondary | ICD-10-CM | POA: Diagnosis present

## 2017-02-25 DIAGNOSIS — Z9221 Personal history of antineoplastic chemotherapy: Secondary | ICD-10-CM | POA: Diagnosis not present

## 2017-02-25 DIAGNOSIS — B9689 Other specified bacterial agents as the cause of diseases classified elsewhere: Secondary | ICD-10-CM | POA: Diagnosis present

## 2017-02-25 DIAGNOSIS — L03113 Cellulitis of right upper limb: Secondary | ICD-10-CM | POA: Diagnosis present

## 2017-02-25 DIAGNOSIS — F329 Major depressive disorder, single episode, unspecified: Secondary | ICD-10-CM | POA: Diagnosis present

## 2017-02-25 DIAGNOSIS — Z923 Personal history of irradiation: Secondary | ICD-10-CM | POA: Diagnosis not present

## 2017-02-25 DIAGNOSIS — Z825 Family history of asthma and other chronic lower respiratory diseases: Secondary | ICD-10-CM | POA: Diagnosis not present

## 2017-02-25 DIAGNOSIS — E119 Type 2 diabetes mellitus without complications: Secondary | ICD-10-CM | POA: Diagnosis present

## 2017-02-25 DIAGNOSIS — F17210 Nicotine dependence, cigarettes, uncomplicated: Secondary | ICD-10-CM | POA: Diagnosis present

## 2017-02-25 DIAGNOSIS — Z8612 Personal history of poliomyelitis: Secondary | ICD-10-CM | POA: Diagnosis not present

## 2017-02-25 DIAGNOSIS — Z8571 Personal history of Hodgkin lymphoma: Secondary | ICD-10-CM | POA: Diagnosis not present

## 2017-02-25 DIAGNOSIS — W5501XA Bitten by cat, initial encounter: Secondary | ICD-10-CM | POA: Diagnosis not present

## 2017-02-25 DIAGNOSIS — I10 Essential (primary) hypertension: Secondary | ICD-10-CM | POA: Diagnosis present

## 2017-02-25 DIAGNOSIS — Z7982 Long term (current) use of aspirin: Secondary | ICD-10-CM | POA: Diagnosis not present

## 2017-02-25 DIAGNOSIS — Z79899 Other long term (current) drug therapy: Secondary | ICD-10-CM | POA: Diagnosis not present

## 2017-02-25 DIAGNOSIS — Z9049 Acquired absence of other specified parts of digestive tract: Secondary | ICD-10-CM | POA: Diagnosis not present

## 2017-02-25 DIAGNOSIS — G14 Postpolio syndrome: Secondary | ICD-10-CM | POA: Diagnosis present

## 2017-02-25 LAB — BASIC METABOLIC PANEL
Anion gap: 6 (ref 5–15)
BUN: 22 mg/dL — ABNORMAL HIGH (ref 6–20)
CALCIUM: 8.2 mg/dL — AB (ref 8.9–10.3)
CHLORIDE: 107 mmol/L (ref 101–111)
CO2: 28 mmol/L (ref 22–32)
CREATININE: 0.77 mg/dL (ref 0.44–1.00)
Glucose, Bld: 119 mg/dL — ABNORMAL HIGH (ref 65–99)
Potassium: 4 mmol/L (ref 3.5–5.1)
SODIUM: 141 mmol/L (ref 135–145)

## 2017-02-25 LAB — GLUCOSE, CAPILLARY
GLUCOSE-CAPILLARY: 114 mg/dL — AB (ref 65–99)
GLUCOSE-CAPILLARY: 75 mg/dL (ref 65–99)
GLUCOSE-CAPILLARY: 91 mg/dL (ref 65–99)
Glucose-Capillary: 125 mg/dL — ABNORMAL HIGH (ref 65–99)
Glucose-Capillary: 96 mg/dL (ref 65–99)

## 2017-02-25 LAB — CBC
HCT: 39.6 % (ref 35.0–47.0)
Hemoglobin: 13.6 g/dL (ref 12.0–16.0)
MCH: 34.1 pg — ABNORMAL HIGH (ref 26.0–34.0)
MCHC: 34.4 g/dL (ref 32.0–36.0)
MCV: 99.2 fL (ref 80.0–100.0)
PLATELETS: 127 10*3/uL — AB (ref 150–440)
RBC: 3.99 MIL/uL (ref 3.80–5.20)
RDW: 13.8 % (ref 11.5–14.5)
WBC: 17 10*3/uL — AB (ref 3.6–11.0)

## 2017-02-25 MED ORDER — MIRABEGRON ER 50 MG PO TB24
50.0000 mg | ORAL_TABLET | Freq: Every day | ORAL | Status: DC
  Administered 2017-02-25 – 2017-02-27 (×3): 50 mg via ORAL
  Filled 2017-02-25 (×5): qty 1

## 2017-02-25 MED ORDER — ENOXAPARIN SODIUM 40 MG/0.4ML ~~LOC~~ SOLN
40.0000 mg | SUBCUTANEOUS | Status: DC
  Administered 2017-02-25 – 2017-02-27 (×4): 40 mg via SUBCUTANEOUS
  Filled 2017-02-25 (×4): qty 0.4

## 2017-02-25 MED ORDER — INSULIN ASPART 100 UNIT/ML ~~LOC~~ SOLN: 0.0000 [IU] | Freq: Every day | SUBCUTANEOUS | Status: DC

## 2017-02-25 MED ORDER — MORPHINE SULFATE (PF) 2 MG/ML IV SOLN: 2.0000 mg | INTRAVENOUS | Status: DC | PRN

## 2017-02-25 MED ORDER — OXYCODONE HCL 5 MG PO TABS
5.0000 mg | ORAL_TABLET | ORAL | Status: DC | PRN
  Administered 2017-02-25 – 2017-02-26 (×6): 5 mg via ORAL
  Filled 2017-02-25 (×6): qty 1

## 2017-02-25 MED ORDER — PAROXETINE HCL 20 MG PO TABS
20.0000 mg | ORAL_TABLET | Freq: Every day | ORAL | Status: DC
  Administered 2017-02-25 – 2017-02-28 (×4): 20 mg via ORAL
  Filled 2017-02-25 (×4): qty 1

## 2017-02-25 MED ORDER — METOPROLOL TARTRATE 25 MG PO TABS
12.5000 mg | ORAL_TABLET | Freq: Two times a day (BID) | ORAL | Status: DC
  Administered 2017-02-25 – 2017-02-28 (×7): 12.5 mg via ORAL
  Filled 2017-02-25 (×7): qty 1

## 2017-02-25 MED ORDER — ACETAMINOPHEN 325 MG PO TABS
650.0000 mg | ORAL_TABLET | Freq: Four times a day (QID) | ORAL | Status: DC | PRN
  Administered 2017-02-25: 650 mg via ORAL
  Filled 2017-02-25: qty 2

## 2017-02-25 MED ORDER — DIVALPROEX SODIUM 250 MG PO DR TAB
250.0000 mg | DELAYED_RELEASE_TABLET | Freq: Two times a day (BID) | ORAL | Status: DC
  Administered 2017-02-25 – 2017-02-28 (×7): 250 mg via ORAL
  Filled 2017-02-25 (×10): qty 1

## 2017-02-25 MED ORDER — IPRATROPIUM-ALBUTEROL 0.5-2.5 (3) MG/3ML IN SOLN: 3.0000 mL | RESPIRATORY_TRACT | Status: DC | PRN

## 2017-02-25 MED ORDER — SODIUM CHLORIDE 0.9 % IV SOLN
3.0000 g | Freq: Four times a day (QID) | INTRAVENOUS | Status: DC
  Administered 2017-02-25 – 2017-02-28 (×13): 3 g via INTRAVENOUS
  Filled 2017-02-25 (×16): qty 3

## 2017-02-25 MED ORDER — MOMETASONE FURO-FORMOTEROL FUM 200-5 MCG/ACT IN AERO
2.0000 | INHALATION_SPRAY | Freq: Two times a day (BID) | RESPIRATORY_TRACT | Status: DC
  Administered 2017-02-25 – 2017-02-28 (×7): 2 via RESPIRATORY_TRACT
  Filled 2017-02-25: qty 8.8

## 2017-02-25 MED ORDER — HYDROCODONE-ACETAMINOPHEN 5-325 MG PO TABS
1.0000 | ORAL_TABLET | ORAL | Status: DC | PRN
  Administered 2017-02-26 – 2017-02-27 (×4): 1 via ORAL
  Filled 2017-02-25 (×4): qty 1

## 2017-02-25 MED ORDER — ONDANSETRON HCL 4 MG PO TABS: 4.0000 mg | ORAL_TABLET | Freq: Four times a day (QID) | ORAL | Status: DC | PRN

## 2017-02-25 MED ORDER — INSULIN ASPART 100 UNIT/ML ~~LOC~~ SOLN
0.0000 [IU] | Freq: Three times a day (TID) | SUBCUTANEOUS | Status: DC
  Filled 2017-02-25: qty 1

## 2017-02-25 MED ORDER — ALBUTEROL SULFATE (2.5 MG/3ML) 0.083% IN NEBU: 2.5000 mg | INHALATION_SOLUTION | RESPIRATORY_TRACT | Status: DC | PRN

## 2017-02-25 MED ORDER — ASPIRIN EC 81 MG PO TBEC
81.0000 mg | DELAYED_RELEASE_TABLET | Freq: Every day | ORAL | Status: DC
  Administered 2017-02-25 – 2017-02-28 (×4): 81 mg via ORAL
  Filled 2017-02-25 (×4): qty 1

## 2017-02-25 MED ORDER — LOSARTAN POTASSIUM 50 MG PO TABS
100.0000 mg | ORAL_TABLET | Freq: Every day | ORAL | Status: DC
  Administered 2017-02-25 – 2017-02-28 (×4): 100 mg via ORAL
  Filled 2017-02-25 (×4): qty 2

## 2017-02-25 MED ORDER — CLONAZEPAM 0.5 MG PO TABS
0.5000 mg | ORAL_TABLET | Freq: Two times a day (BID) | ORAL | Status: DC | PRN
  Administered 2017-02-25 – 2017-02-27 (×3): 0.5 mg via ORAL
  Filled 2017-02-25 (×3): qty 1

## 2017-02-25 MED ORDER — LEVOTHYROXINE SODIUM 50 MCG PO TABS
75.0000 ug | ORAL_TABLET | Freq: Every day | ORAL | Status: DC
  Administered 2017-02-26 – 2017-02-27 (×2): 75 ug via ORAL
  Filled 2017-02-25 (×2): qty 1

## 2017-02-25 MED ORDER — ONDANSETRON HCL 4 MG/2ML IJ SOLN: 4.0000 mg | Freq: Four times a day (QID) | INTRAMUSCULAR | Status: DC | PRN

## 2017-02-25 MED ORDER — ACETAMINOPHEN 650 MG RE SUPP: 650.0000 mg | Freq: Four times a day (QID) | RECTAL | Status: DC | PRN

## 2017-02-25 NOTE — Progress Notes (Signed)
Pt temp 100.5, PRN Tylenol given, MD Sainani notified.

## 2017-02-25 NOTE — Care Management Obs Status (Signed)
[place] NOTIFICATION   Patient Details  Name: [name] MRN: [id number] Date of Birth: 11-Jun-1946   Medicare Observation Status Notification Given:  Yes    Jolly Mango, RN 02/25/2017, 9:54 AM

## 2017-02-25 NOTE — Progress Notes (Signed)
Pharmacy Antibiotic Note  [name] is a 71 y.o. female admitted on 02/24/2017 with cellulitis.  Pharmacy has been consulted for unasyn dosing.  Plan: Unasyn 3g IV q6h  Height: 5' (152.4 cm) Weight: 127 lb (57.6 kg) IBW/kg (Calculated) : 45.5  Temp (24hrs), Avg:98.4 F (36.9 C), Min:98.4 F (36.9 C), Max:98.4 F (36.9 C)   Recent Labs Lab 02/24/17 2254  WBC 20.9*  CREATININE 0.61    Estimated Creatinine Clearance: 52 mL/min (by C-G formula based on SCr of 0.61 mg/dL).    Allergies  Allergen Reactions  . Prednisone Rash  . Trazodone Rash    Thank you for allowing pharmacy to be a part of this patient's care.  Tobie Lords, PharmD, BCPS Clinical Pharmacist 02/25/2017

## 2017-02-25 NOTE — Progress Notes (Signed)
[facility] [place] NAME: [name]    MR#:  [id number]  DATE OF BIRTH:  08-May-1946  SUBJECTIVE:   Patient here due to right upper extremity cellulitis after a cat bite. Still having some significant pain in the right arm with some redness. Also had a fever of 100.5 this morning. No other complaints presently.  REVIEW OF SYSTEMS:    Review of Systems  Constitutional: Positive for fever. Negative for chills.  HENT: Negative for congestion and tinnitus.   Eyes: Negative for blurred vision and double vision.  Respiratory: Negative for cough, shortness of breath and wheezing.   Cardiovascular: Negative for chest pain, orthopnea and PND.  Gastrointestinal: Negative for abdominal pain, diarrhea, nausea and vomiting.  Genitourinary: Negative for dysuria and hematuria.  Neurological: Negative for dizziness, sensory change and focal weakness.  All other systems reviewed and are negative.   Nutrition: Heart healthy/Carb control Tolerating Diet: Yes Tolerating PT: Ambulatory.   DRUG ALLERGIES:   Allergies  Allergen Reactions  . Prednisone Rash  . Trazodone Rash    VITALS:  Blood pressure (!) 117/46, pulse 95, temperature 100 F (37.8 C), temperature source Oral, resp. rate 18, height 5' (1.524 m), weight 57.6 kg (127 lb), SpO2 96 %.  PHYSICAL EXAMINATION:   Physical Exam  GENERAL:  71 y.o.-year-old patient lying in bed in no acute distress.  EYES: Pupils equal, round, reactive to light. No scleral icterus. Extraocular muscles intact.  HEENT: Head atraumatic, normocephalic. Oropharynx and nasopharynx clear.  NECK:  Supple, no jugular venous distention. No thyroid enlargement, no tenderness.  LUNGS: Normal breath sounds bilaterally, no wheezing, rales, rhonchi. No use of accessory muscles of respiration.  CARDIOVASCULAR: S1, S2 normal. No murmurs, rubs, or gallops.  ABDOMEN: Soft, nontender, nondistended. Bowel sounds present. No  organomegaly or mass.  EXTREMITIES: No cyanosis, clubbing or edema b/l.   Right upper ext. Redness, warmth due to cellulitis.  NEUROLOGIC: Cranial nerves II through XII are intact. No focal Motor or sensory deficits b/l. +Intention Tremor  PSYCHIATRIC: The patient is alert and oriented x 3.  SKIN: No obvious rash, lesion, or ulcer. RUE cellulitis.    LABORATORY PANEL:   CBC  Recent Labs Lab 02/25/17 0515  WBC 17.0*  HGB 13.6  HCT 39.6  PLT 127*   ------------------------------------------------------------------------------------------------------------------  Chemistries   Recent Labs Lab 02/25/17 0515  NA 141  K 4.0  CL 107  CO2 28  GLUCOSE 119*  BUN 22*  CREATININE 0.77  CALCIUM 8.2*   ------------------------------------------------------------------------------------------------------------------  Cardiac Enzymes No results for input(s): TROPONINI in the last 168 hours. ------------------------------------------------------------------------------------------------------------------  RADIOLOGY:  No results found.   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  1. Right upper extremity cellulitis-cause of patient's right upper extremity swelling and redness and pain. This is after her cat bite couple days back. There is no evidence of open area or acute drainage or abscess formation. - cont. IV Unasyn and follow clinically.  - WBC count trending down.    2. Leukocytosis - due to # 1.  - trending down with abx.   3. Essential hypertension-continue losartan, HCTZ.  4. Hypothyroidism-continue Synthroid.  5. COPD-no acute exacerbation, continue Dulera, duonebs as needed.   6. History of seizures-continue Depakote.  7. Anxiety-continue  Klonopin.  All the  records are reviewed and case discussed with Care Management/Social Worker. Management plans discussed with the patient, family and they are in agreement.  CODE STATUS: Full code  DVT Prophylaxis: Lovenox  TOTAL TIME TAKING CARE OF THIS PATIENT: 30 minutes.   POSSIBLE D/C IN 1-2 DAYS, DEPENDING ON CLINICAL CONDITION.   Henreitta Leber M.D on 02/25/2017 at 2:05 PM  Between 7am to 6pm - Pager - [phone]  After 6pm go to [email or web address] - Proofreader  [facility]  Office  [phone]  CC: Primary care physician; Madelyn Brunner, MD

## 2017-02-26 ENCOUNTER — Inpatient Hospital Stay: Payer: Medicare Other

## 2017-02-26 LAB — GLUCOSE, CAPILLARY
GLUCOSE-CAPILLARY: 85 mg/dL (ref 65–99)
GLUCOSE-CAPILLARY: 116 mg/dL — AB (ref 65–99)
GLUCOSE-CAPILLARY: 86 mg/dL (ref 65–99)
Glucose-Capillary: 64 mg/dL — ABNORMAL LOW (ref 65–99)

## 2017-02-26 LAB — CBC
HEMATOCRIT: 36.6 % (ref 35.0–47.0)
HEMOGLOBIN: 12.9 g/dL (ref 12.0–16.0)
MCH: 34.6 pg — ABNORMAL HIGH (ref 26.0–34.0)
MCHC: 35.3 g/dL (ref 32.0–36.0)
MCV: 98 fL (ref 80.0–100.0)
Platelets: 108 10*3/uL — ABNORMAL LOW (ref 150–440)
RBC: 3.74 MIL/uL — AB (ref 3.80–5.20)
RDW: 14.1 % (ref 11.5–14.5)
WBC: 13.8 10*3/uL — AB (ref 3.6–11.0)

## 2017-02-26 MED ORDER — PNEUMOCOCCAL VAC POLYVALENT 25 MCG/0.5ML IJ INJ
0.5000 mL | INJECTION | INTRAMUSCULAR | Status: AC
  Administered 2017-02-27: 0.5 mL via INTRAMUSCULAR
  Filled 2017-02-26: qty 0.5

## 2017-02-26 NOTE — Progress Notes (Signed)
[facility] [place] NAME: [name]    MR#:  [id number]  DATE OF BIRTH:  25-Dec-1945  SUBJECTIVE:   Patient here due to right upper extremity cellulitis after a cat bite. No fever today,but the redness and swelling of the right upper extremity has not improved and is now tracking up her arm.   REVIEW OF SYSTEMS:    Review of Systems  Constitutional: Negative for chills and fever.  HENT: Negative for congestion and tinnitus.   Eyes: Negative for blurred vision and double vision.  Respiratory: Negative for cough, shortness of breath and wheezing.   Cardiovascular: Negative for chest pain, orthopnea and PND.  Gastrointestinal: Negative for abdominal pain, diarrhea, nausea and vomiting.  Genitourinary: Negative for dysuria and hematuria.  Skin: Positive for rash (RUE cellulitis. ).  Neurological: Negative for dizziness, sensory change and focal weakness.  All other systems reviewed and are negative.   Nutrition: Heart healthy/Carb control Tolerating Diet: Yes Tolerating PT: Ambulatory.   DRUG ALLERGIES:   Allergies  Allergen Reactions  . Prednisone Rash  . Trazodone Rash    VITALS:  Blood pressure (!) 148/52, pulse 71, temperature 98.3 F (36.8 C), temperature source Oral, resp. rate 18, height 5' (1.524 m), weight 57.6 kg (127 lb), SpO2 92 %.  PHYSICAL EXAMINATION:   Physical Exam  GENERAL:  71 y.o.-year-old patient lying in bed in no acute distress.  EYES: Pupils equal, round, reactive to light. No scleral icterus. Extraocular muscles intact.  HEENT: Head atraumatic, normocephalic. Oropharynx and nasopharynx clear.  NECK:  Supple, no jugular venous distention. No thyroid enlargement, no tenderness.  LUNGS: Normal breath sounds bilaterally, no wheezing, rales, rhonchi. No use of accessory muscles of respiration.  CARDIOVASCULAR: S1, S2 normal. No murmurs, rubs, or gallops.  ABDOMEN: Soft, nontender, nondistended. Bowel sounds  present. No organomegaly or mass.  EXTREMITIES: No cyanosis, clubbing or edema b/l.   Right upper ext. Redness, warmth due to cellulitis.  NEUROLOGIC: Cranial nerves II through XII are intact. No focal Motor or sensory deficits b/l. + Intention Tremor  PSYCHIATRIC: The patient is alert and oriented x 3.  SKIN: No obvious rash, lesion, or ulcer. RUE cellulitis.    LABORATORY PANEL:   CBC  Recent Labs Lab 02/26/17 0519  WBC 13.8*  HGB 12.9  HCT 36.6  PLT 108*   ------------------------------------------------------------------------------------------------------------------  Chemistries   Recent Labs Lab 02/25/17 0515  NA 141  K 4.0  CL 107  CO2 28  GLUCOSE 119*  BUN 22*  CREATININE 0.77  CALCIUM 8.2*   ------------------------------------------------------------------------------------------------------------------  Cardiac Enzymes No results for input(s): TROPONINI in the last 168 hours. ------------------------------------------------------------------------------------------------------------------  RADIOLOGY:  No results found.   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  1. Right upper extremity cellulitis-cause of patient's right upper extremity swelling and redness and pain. This is after her cat bite couple days back. Pt. Has some redness streaking up her arm. Will get [place] to r/o DVT.  - cont. IV Unasyn and will get ID consult.   - WBC count trending down.  Afebrile.   2. Leukocytosis - due to # 1.  - trending down with abx.   3. Essential hypertension-continue losartan, HCTZ.  4. Hypothyroidism-continue Synthroid.  5. COPD-no acute exacerbation, continue Dulera, duonebs as needed.  6. History of seizures-continue  Depakote.  7. Anxiety-continue Klonopin.  All the records are reviewed and case discussed with Care Management/Social Worker. Management plans discussed with the patient, family and they are in agreement.  CODE STATUS: Full code  DVT Prophylaxis: Lovenox  TOTAL TIME TAKING CARE OF THIS PATIENT: 30 minutes.   POSSIBLE D/C IN 1-2 DAYS, DEPENDING ON CLINICAL CONDITION.   Henreitta Leber M.D on 02/26/2017 at 1:33 PM  Between 7am to 6pm - Pager - [phone]  After 6pm go to [email or web address] - Proofreader  [facility]  Office  [phone]  CC: Primary care physician; Madelyn Brunner, MD

## 2017-02-26 NOTE — Consult Note (Signed)
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 female admitted with R arm cellulitis following a cat bite. On admit wbc 20, temp up to 100.5.  USS negative. She had relatively rapid onset of redness and pain. Currently on unasyn btu still with swelling and pain not elevating arm.   Past Medical History:  Diagnosis Date  . Cancer [facility])    hodgkins lymphoma dx 2011 chemo and rad tx finished in 2012  . COPD (chronic obstructive pulmonary disease) ([place])   . Degenerative disc disease, cervical   . Depression   . Diabetes mellitus ([place])   . GERD (gastroesophageal reflux disease)   . Hypertension   . Polio   . Postpolio syndrome    left leg paralysis   Past Surgical History:  Procedure Laterality Date  . ABDOMINAL HYSTERECTOMY    . BLADDER SURGERY    . BREAST LUMPECTOMY    . CARPAL TUNNEL RELEASE  2004   Bilateral  . CHOLECYSTECTOMY    . RECTAL PROLAPSE REPAIR  April 2010   Social History  Substance Use Topics  . Smoking status: Current Every Day Smoker    Packs/day: 0.50    Years: 57.00    Types: Cigarettes  . Smokeless tobacco: Never Used     Comment: quit smoking 11/13/15  . Alcohol use No   Family History  Problem Relation Age of Onset  . Cancer Father   . Cancer Paternal Grandmother   . Diabetes Mother   . Emphysema Mother     Allergies:  Allergies  Allergen Reactions  . Prednisone Rash  . Trazodone Rash    Current antibiotics: Antibiotics Given (last 72 hours)    Date/Time Action Medication Dose Rate   02/24/17 2302 New Bag/Given    Ampicillin-Sulbactam (UNASYN) 3 g in sodium chloride 0.9 % 100 mL IVPB 3 g 200 mL/hr   02/25/17 0529 New Bag/Given   Ampicillin-Sulbactam (UNASYN) 3 g in sodium chloride 0.9 % 100 mL IVPB 3 g 200 mL/hr   02/25/17 1126 New Bag/Given   Ampicillin-Sulbactam (UNASYN) 3 g in sodium chloride 0.9 % 100 mL IVPB 3 g 200 mL/hr   02/25/17 1658 New Bag/Given   Ampicillin-Sulbactam (UNASYN) 3 g in sodium chloride 0.9 % 100 mL IVPB 3 g 200 mL/hr   02/25/17 2220 New Bag/Given   Ampicillin-Sulbactam (UNASYN) 3 g in sodium chloride 0.9 % 100 mL IVPB 3 g 200 mL/hr   02/26/17 0556 New Bag/Given   Ampicillin-Sulbactam (UNASYN) 3 g in sodium chloride 0.9 % 100 mL IVPB 3 g 200 mL/hr   02/26/17 1108 New Bag/Given   Ampicillin-Sulbactam (UNASYN) 3 g in sodium chloride 0.9 % 100 mL IVPB 3 g 200 mL/hr   02/26/17 1458 New Bag/Given   Ampicillin-Sulbactam (UNASYN) 3 g in sodium chloride 0.9 % 100 mL IVPB 3 g 200 mL/hr      MEDICATIONS: . aspirin EC  81 mg Oral Daily  . divalproex  250 mg Oral BID  . enoxaparin (LOVENOX) injection  40 mg Subcutaneous Q24H  . insulin aspart  0-5 Units Subcutaneous QHS  . insulin aspart  0-9 Units Subcutaneous TID WC  . levothyroxine  75 mcg Oral QAC breakfast  . losartan  100 mg Oral Daily  . metoprolol tartrate  12.5 mg Oral BID  . mirabegron ER  50 mg Oral QHS  . mometasone-formoterol  2 puff Inhalation BID  . PARoxetine  20 mg Oral Daily  . [START ON 02/27/2017] pneumococcal 23 valent vaccine  0.5 mL Intramuscular Tomorrow-1000    Review of Systems - 11 systems reviewed and negative per HPI   OBJECTIVE: Temp:  [98.3 F (36.8 C)-98.9 F (37.2 C)] 98.3 F (36.8 C) (09/12 0733) Pulse Rate:  [71-87] 71 (09/12 0733) Resp:  [18] 18 (09/11 2045) BP: (100-148)/(49-64) 148/52 (09/12 0733) SpO2:  [92 %-97 %] 92 % (09/12 0733) Physical Exam  Constitutional:  oriented to person, place, and time. Thin, frail, tremors HENT: [place]/AT, PERRLA, no scleral icterus Mouth/Throat:  Oropharynx is clear and moist. No oropharyngeal exudate.  Cardiovascular: Normal rate, regular rhythm and normal heart sounds. Exam reveals no gallop and no friction rub.  No murmur heard.  Pulmonary/Chest: Effort normal and breath sounds normal. No respiratory distress.  has no wheezes.  Neck = supple, no nuchal rigidity Abdominal: Soft. Bowel sounds are normal.  exhibits no distension. There is no tenderness.  Lymphadenopathy: no cervical adenopathy. No axillary adenopathy Neurological: alert and oriented to person, place, and time.  R arm with forearm soft pitting edema up past elbow Skin: L forearm diffusely red, extending down into dorsum of hand and up a little past elbow. Area is diffusely tender but no obvious purulence or fluctuance. Psychiatric: a normal mood and affect.  behavior is normal.    LABS: Results for orders placed or performed during the hospital encounter of 02/24/17 (from the past 48 hour(s))  Basic metabolic panel     Status: Abnormal   Collection Time: 02/24/17 10:54 PM  Result Value Ref Range   Sodium 138 135 - 145 mmol/L   Potassium 3.8 3.5 - 5.1 mmol/L   Chloride 104 101 - 111 mmol/L   CO2 27 22 - 32 mmol/L   Glucose, Bld 150 (H) 65 - 99 mg/dL   BUN 17 6 - 20 mg/dL   Creatinine, Ser 0.61 0.44 - 1.00 mg/dL   Calcium 8.9 8.9 - 10.3 mg/dL   GFR calc non Af Amer >60 >60 mL/min   GFR calc Af Amer >60 >60 mL/min    Comment: (NOTE) The eGFR has been calculated using the CKD EPI equation. This calculation has not been validated in all clinical situations. eGFR's persistently <60 mL/min signify possible Chronic Kidney Disease.    Anion gap 7 5 - 15  CBC with Differential     Status: Abnormal   Collection Time: 02/24/17 10:54 PM  Result Value Ref Range   WBC 20.9 (H) 3.6 - 11.0 K/uL   RBC 4.60 3.80 - 5.20 MIL/uL   Hemoglobin 15.3 12.0 - 16.0 g/dL   HCT 44.3 35.0 - 47.0 %   MCV 96.3 80.0 - 100.0 fL   MCH 33.3 26.0 - 34.0 pg   MCHC 34.5 32.0 - 36.0 g/dL    RDW 13.9 11.5 - 14.5 %   Platelets 166 150 - 440 K/uL   Neutrophils Relative % 86 %   Neutro Abs 17.9 (H) 1.4 - 6.5 K/uL   Lymphocytes Relative 5 %   Lymphs Abs 0.9 (L) 1.0 - 3.6 K/uL   Monocytes Relative 9 %  Monocytes Absolute 1.9 (H) 0.2 - 0.9 K/uL   Eosinophils Relative 0 %   Eosinophils Absolute 0.0 0 - 0.7 K/uL   Basophils Relative 0 %   Basophils Absolute 0.1 0 - 0.1 K/uL  Culture, blood (routine x 2)     Status: None (Preliminary result)   Collection Time: 02/24/17 10:54 PM  Result Value Ref Range   Specimen Description BLOOD LFOA    Special Requests      BOTTLES DRAWN AEROBIC AND ANAEROBIC Blood Culture results may not be optimal due to an excessive volume of blood received in culture bottles   Culture NO GROWTH 2 DAYS    Report Status PENDING   Culture, blood (routine x 2)     Status: None (Preliminary result)   Collection Time: 02/24/17 10:54 PM  Result Value Ref Range   Specimen Description BLOOD LAC    Special Requests      BOTTLES DRAWN AEROBIC AND ANAEROBIC Blood Culture results may not be optimal due to an excessive volume of blood received in culture bottles   Culture NO GROWTH 2 DAYS    Report Status PENDING   Glucose, capillary     Status: Abnormal   Collection Time: 02/25/17  1:08 AM  Result Value Ref Range   Glucose-Capillary 125 (H) 65 - 99 mg/dL   Comment 1 Notify RN   Basic metabolic panel     Status: Abnormal   Collection Time: 02/25/17  5:15 AM  Result Value Ref Range   Sodium 141 135 - 145 mmol/L   Potassium 4.0 3.5 - 5.1 mmol/L   Chloride 107 101 - 111 mmol/L   CO2 28 22 - 32 mmol/L   Glucose, Bld 119 (H) 65 - 99 mg/dL   BUN 22 (H) 6 - 20 mg/dL   Creatinine, Ser 0.77 0.44 - 1.00 mg/dL   Calcium 8.2 (L) 8.9 - 10.3 mg/dL   GFR calc non Af Amer >60 >60 mL/min   GFR calc Af Amer >60 >60 mL/min    Comment: (NOTE) The eGFR has been calculated using the CKD EPI equation. This calculation has not been validated in all clinical situations. eGFR's  persistently <60 mL/min signify possible Chronic Kidney Disease.    Anion gap 6 5 - 15  CBC     Status: Abnormal   Collection Time: 02/25/17  5:15 AM  Result Value Ref Range   WBC 17.0 (H) 3.6 - 11.0 K/uL   RBC 3.99 3.80 - 5.20 MIL/uL   Hemoglobin 13.6 12.0 - 16.0 g/dL   HCT 39.6 35.0 - 47.0 %   MCV 99.2 80.0 - 100.0 fL   MCH 34.1 (H) 26.0 - 34.0 pg   MCHC 34.4 32.0 - 36.0 g/dL   RDW 13.8 11.5 - 14.5 %   Platelets 127 (L) 150 - 440 K/uL  Glucose, capillary     Status: Abnormal   Collection Time: 02/25/17  8:22 AM  Result Value Ref Range   Glucose-Capillary 114 (H) 65 - 99 mg/dL  Glucose, capillary     Status: None   Collection Time: 02/25/17 12:10 PM  Result Value Ref Range   Glucose-Capillary 96 65 - 99 mg/dL  Glucose, capillary     Status: None   Collection Time: 02/25/17  4:54 PM  Result Value Ref Range   Glucose-Capillary 75 65 - 99 mg/dL  Glucose, capillary     Status: None   Collection Time: 02/25/17  8:55 PM  Result Value Ref Range   Glucose-Capillary  91 65 - 99 mg/dL  CBC     Status: Abnormal   Collection Time: 02/26/17  5:19 AM  Result Value Ref Range   WBC 13.8 (H) 3.6 - 11.0 K/uL   RBC 3.74 (L) 3.80 - 5.20 MIL/uL   Hemoglobin 12.9 12.0 - 16.0 g/dL   HCT 36.6 35.0 - 47.0 %   MCV 98.0 80.0 - 100.0 fL   MCH 34.6 (H) 26.0 - 34.0 pg   MCHC 35.3 32.0 - 36.0 g/dL   RDW 14.1 11.5 - 14.5 %   Platelets 108 (L) 150 - 440 K/uL  Glucose, capillary     Status: Abnormal   Collection Time: 02/26/17  7:43 AM  Result Value Ref Range   Glucose-Capillary 64 (L) 65 - 99 mg/dL  Glucose, capillary     Status: None   Collection Time: 02/26/17 12:02 PM  Result Value Ref Range   Glucose-Capillary 85 65 - 99 mg/dL   No components found for: ESR, C REACTIVE PROTEIN MICRO: Recent Results (from the past 720 hour(s))  Culture, blood (routine x 2)     Status: None (Preliminary result)   Collection Time: 02/24/17 10:54 PM  Result Value Ref Range Status   Specimen Description  BLOOD LFOA  Final   Special Requests   Final    BOTTLES DRAWN AEROBIC AND ANAEROBIC Blood Culture results may not be optimal due to an excessive volume of blood received in culture bottles   Culture NO GROWTH 2 DAYS  Final   Report Status PENDING  Incomplete  Culture, blood (routine x 2)     Status: None (Preliminary result)   Collection Time: 02/24/17 10:54 PM  Result Value Ref Range Status   Specimen Description BLOOD LAC  Final   Special Requests   Final    BOTTLES DRAWN AEROBIC AND ANAEROBIC Blood Culture results may not be optimal due to an excessive volume of blood received in culture bottles   Culture NO GROWTH 2 DAYS  Final   Report Status PENDING  Incomplete    IMAGING: US Venous Img Upper Uni Right  Result Date: 02/26/2017 CLINICAL DATA:  Right upper extremity swelling EXAM: RIGHT UPPER EXTREMITY VENOUS DOPPLER ULTRASOUND TECHNIQUE: Gray-scale sonography with graded compression, as well as color Doppler and duplex ultrasound were performed to evaluate the upper extremity deep venous system from the level of the subclavian vein and including the jugular, axillary, basilic, radial, ulnar and upper cephalic vein. Spectral Doppler was utilized to evaluate flow at rest and with distal augmentation maneuvers. COMPARISON:  None. FINDINGS: Contralateral Subclavian Vein: Respiratory phasicity is normal and symmetric with the symptomatic side. No evidence of thrombus. Normal compressibility. Internal Jugular Vein: No evidence of thrombus. Normal compressibility, respiratory phasicity and response to augmentation. Subclavian Vein: No evidence of thrombus. Normal compressibility, respiratory phasicity and response to augmentation. Axillary Vein: No evidence of thrombus. Normal compressibility, respiratory phasicity and response to augmentation. Cephalic Vein: No evidence of thrombus. Normal compressibility, respiratory phasicity and response to augmentation. Basilic Vein: No evidence of thrombus.  Normal compressibility, respiratory phasicity and response to augmentation. Brachial Veins: No evidence of thrombus. Normal compressibility, respiratory phasicity and response to augmentation. Radial Veins: No evidence of thrombus. Normal compressibility, respiratory phasicity and response to augmentation. Ulnar Veins: No evidence of thrombus. Normal compressibility, respiratory phasicity and response to augmentation. Venous Reflux:  None visualized. Other Findings:  None visualized. IMPRESSION: No evidence of DVT within the right upper extremity. Electronically Signed   By: Rolm Baptise M.D.  On: 02/26/2017 13:48   Assessment:   [name] is a 71 y.o. female with RUE cellulitis following a cat bite with rapid onset and wbc 20, low grade temps. She has significant infection of her R forearm but seems to be sparing the hand fortunately. Nothing obvious to drain. Main issue is the pain and edema. Is on unasyn which is appropriate. [place] pending.   Recommendations We discussed elevating the arm above the level of the heart with 5-6 pillows. Demonstrated to patient and discussed with nurse. Continue unasyn.  Monitor for abscess development.  If improving in next 24-48 hours could dc on oral augmentin  Thank you very much for allowing me to participate in the care of this patient. Please call with questions.   Cheral Marker. Ola Spurr, MD

## 2017-02-27 LAB — CBC
HCT: 34.3 % — ABNORMAL LOW (ref 35.0–47.0)
Hemoglobin: 12.2 g/dL (ref 12.0–16.0)
MCH: 34.4 pg — ABNORMAL HIGH (ref 26.0–34.0)
MCHC: 35.5 g/dL (ref 32.0–36.0)
MCV: 97 fL (ref 80.0–100.0)
Platelets: 101 10*3/uL — ABNORMAL LOW (ref 150–440)
RBC: 3.53 MIL/uL — ABNORMAL LOW (ref 3.80–5.20)
RDW: 13.9 % (ref 11.5–14.5)
WBC: 10.3 10*3/uL (ref 3.6–11.0)

## 2017-02-27 LAB — GLUCOSE, CAPILLARY
Glucose-Capillary: 74 mg/dL (ref 65–99)
Glucose-Capillary: 82 mg/dL (ref 65–99)
Glucose-Capillary: 109 mg/dL — ABNORMAL HIGH (ref 65–99)
Glucose-Capillary: 89 mg/dL (ref 65–99)

## 2017-02-27 NOTE — Progress Notes (Signed)
[facility] INFECTIOUS DISEASE PROGRESS NOTE Date of Admission:  02/24/2017     ID: [name] is a 71 y.o. 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 Active Problems:   Chronic obstructive airway disease with asthma ([place])   Diabetes mellitus type 2, controlled ([place])   Esophageal reflux   Benign essential hypertension   Hypothyroidism due to acquired atrophy of thyroid   Subjective: No fevers, still with pain mainly above elbow with swelling   ROS  Eleven systems are reviewed and negative except per hpi  Medications:  Antibiotics Given (last 72 hours)    Date/Time Action Medication Dose Rate   02/24/17 2302 New Bag/Given   Ampicillin-Sulbactam (UNASYN) 3 g in sodium chloride 0.9 % 100 mL IVPB 3 g 200 mL/hr   02/25/17 0529 New Bag/Given   Ampicillin-Sulbactam (UNASYN) 3 g in sodium chloride 0.9 % 100 mL IVPB 3 g 200 mL/hr   02/25/17 1126 New Bag/Given   Ampicillin-Sulbactam (UNASYN) 3 g in sodium chloride 0.9 % 100 mL IVPB 3 g 200 mL/hr   02/25/17 1658 New Bag/Given   Ampicillin-Sulbactam (UNASYN) 3 g in sodium chloride 0.9 % 100 mL IVPB 3 g 200 mL/hr   02/25/17 2220 New Bag/Given   Ampicillin-Sulbactam (UNASYN) 3 g in sodium chloride 0.9 % 100 mL IVPB 3 g 200 mL/hr   02/26/17 0556 New Bag/Given   Ampicillin-Sulbactam (UNASYN) 3 g in sodium chloride 0.9 % 100 mL IVPB 3 g 200 mL/hr   02/26/17 1108 New Bag/Given   Ampicillin-Sulbactam (UNASYN) 3 g in sodium chloride 0.9 % 100 mL IVPB 3 g 200 mL/hr   02/26/17 1458 New Bag/Given   Ampicillin-Sulbactam (UNASYN) 3 g in sodium chloride 0.9 % 100 mL IVPB 3 g 200 mL/hr   02/26/17 2222 New Bag/Given   Ampicillin-Sulbactam (UNASYN) 3 g in sodium chloride 0.9 % 100 mL IVPB 3 g 200 mL/hr   02/27/17 0520 New Bag/Given   Ampicillin-Sulbactam (UNASYN) 3 g in sodium chloride 0.9 % 100 mL IVPB 3 g 200 mL/hr   02/27/17 0937 New Bag/Given   Ampicillin-Sulbactam (UNASYN) 3 g in sodium  chloride 0.9 % 100 mL IVPB 3 g 200 mL/hr     . aspirin EC  81 mg Oral Daily  . divalproex  250 mg Oral BID  . enoxaparin (LOVENOX) injection  40 mg Subcutaneous Q24H  . insulin aspart  0-5 Units Subcutaneous QHS  . insulin aspart  0-9 Units Subcutaneous TID WC  . levothyroxine  75 mcg Oral QAC breakfast  . losartan  100 mg Oral Daily  . metoprolol tartrate  12.5 mg Oral BID  . mirabegron ER  50 mg Oral QHS  . mometasone-formoterol  2 puff Inhalation BID  . PARoxetine  20 mg Oral Daily    Objective: Vital signs in last 24 hours: Temp:  [98.4 F (36.9 C)-98.8 F (37.1 C)] 98.5 F (36.9 C) (09/13 0700) Pulse Rate:  [64-73] 64 (09/13 0700) Resp:  [18-19] 18 (09/13 0700) BP: (105-133)/(40-65) 125/65 (09/13 0700) SpO2:  [93 %-95 %] 93 % (09/13 0730) Constitutional:  oriented to person, place, and time. Thin, frail, tremors HENT: /AT, PERRLA, no scleral icterus Mouth/Throat: Oropharynx is clear and moist. No oropharyngeal exudate.  Cardiovascular: Normal rate, regular rhythm and normal heart sounds. Exam reveals no gallop and no friction rub.  No murmur heard.  Pulmonary/Chest: Effort normal and breath sounds normal. No respiratory distress.  has no wheezes.  Neck = supple, no nuchal rigidity Abdominal: Soft. Bowel sounds are normal.  exhibits no distension. There is no tenderness.  Lymphadenopathy: no cervical adenopathy. No axillary adenopathy Neurological: alert and oriented to person, place, and time.  R arm with forearm with improvement in edema but has soft pitting edema up past elbow Skin: L forearm less red, extending down into dorsum of hand and up a little past elbow. Area is diffusely tender but no obvious purulence or fluctuance. Psychiatric: a normal mood and affect.  behavior is normal.   Lab Results  Recent Labs  02/24/17 2254 02/25/17 0515 02/26/17 0519 02/27/17 0445  WBC 20.9* 17.0* 13.8* 10.3  HGB 15.3 13.6 12.9 12.2  HCT 44.3 39.6 36.6 34.3*  NA 138  141  --   --   K 3.8 4.0  --   --   CL 104 107  --   --   CO2 27 28  --   --   BUN 17 22*  --   --   CREATININE 0.61 0.77  --   --     Microbiology: Results for orders placed or performed during the hospital encounter of 02/24/17  Culture, blood (routine x 2)     Status: None (Preliminary result)   Collection Time: 02/24/17 10:54 PM  Result Value Ref Range Status   Specimen Description BLOOD LFOA  Final   Special Requests   Final    BOTTLES DRAWN AEROBIC AND ANAEROBIC Blood Culture results may not be optimal due to an excessive volume of blood received in culture bottles   Culture NO GROWTH 3 DAYS  Final   Report Status PENDING  Incomplete  Culture, blood (routine x 2)     Status: None (Preliminary result)   Collection Time: 02/24/17 10:54 PM  Result Value Ref Range Status   Specimen Description BLOOD LAC  Final   Special Requests   Final    BOTTLES DRAWN AEROBIC AND ANAEROBIC Blood Culture results may not be optimal due to an excessive volume of blood received in culture bottles   Culture NO GROWTH 3 DAYS  Final   Report Status PENDING  Incomplete    Studies/Results: US Venous Img Upper Uni Right  Result Date: 02/26/2017 CLINICAL DATA:  Right upper extremity swelling EXAM: RIGHT UPPER EXTREMITY VENOUS DOPPLER ULTRASOUND TECHNIQUE: Gray-scale sonography with graded compression, as well as color Doppler and duplex ultrasound were performed to evaluate the upper extremity deep venous system from the level of the subclavian vein and including the jugular, axillary, basilic, radial, ulnar and upper cephalic vein. Spectral Doppler was utilized to evaluate flow at rest and with distal augmentation maneuvers. COMPARISON:  None. FINDINGS: Contralateral Subclavian Vein: Respiratory phasicity is normal and symmetric with the symptomatic side. No evidence of thrombus. Normal compressibility. Internal Jugular Vein: No evidence of thrombus. Normal compressibility, respiratory phasicity and response  to augmentation. Subclavian Vein: No evidence of thrombus. Normal compressibility, respiratory phasicity and response to augmentation. Axillary Vein: No evidence of thrombus. Normal compressibility, respiratory phasicity and response to augmentation. Cephalic Vein: No evidence of thrombus. Normal compressibility, respiratory phasicity and response to augmentation. Basilic Vein: No evidence of thrombus. Normal compressibility, respiratory phasicity and response to augmentation. Brachial Veins: No evidence of thrombus. Normal compressibility, respiratory phasicity and response to augmentation. Radial Veins: No evidence of thrombus. Normal compressibility, respiratory phasicity and response to augmentation. Ulnar Veins: No evidence of thrombus. Normal compressibility, respiratory phasicity and response to augmentation. Venous Reflux:  None visualized. Other Findings:  None visualized. IMPRESSION: No evidence of DVT within the right upper extremity. Electronically Signed   By: Rolm Baptise M.D.   On: 02/26/2017 13:48    Assessment/Plan: KRISTELL WOODING is a 71 y.o. female with RUE cellulitis following a cat bite with rapid onset and wbc 20, low grade temps. She has significant infection of her R forearm but seems to be sparing the hand fortunately. Nothing obvious to drain. Main issue is the pain and edema. Is on unasyn which is appropriate. [place]. WBC down to 10.   Recommendations Continue  elevating the arm above the level of the heart with 5-6 pillows. Demonstrated to patient and discussed with nurse. Continue unasyn.  Monitor for abscess development.  If improving in next 24-48 hours could dc on oral augmentin for 14 day total course and I can see in fu as otpt Thank you very much for the consult. Will follow with you.  Leonel Ramsay   02/27/2017, 11:57 AM

## 2017-02-27 NOTE — Progress Notes (Signed)
[facility] at [place] NAME: [name]    MR#:  [id number]  DATE OF BIRTH:  11-18-1945  SUBJECTIVE:   Patient here due to right upper extremity cellulitis after a cat bite. Patient still having significant right upper extremity swelling and pain. Dopplers of her right upper extremity were negative for DVT yesterday.  REVIEW OF SYSTEMS:    Review of Systems  Constitutional: Negative for chills and fever.  HENT: Negative for congestion and tinnitus.   Eyes: Negative for blurred vision and double vision.  Respiratory: Negative for cough, shortness of breath and wheezing.   Cardiovascular: Negative for chest pain, orthopnea and PND.  Gastrointestinal: Negative for abdominal pain, diarrhea, nausea and vomiting.  Genitourinary: Negative for dysuria and hematuria.  Skin: Positive for rash (RUE cellulitis. ).  Neurological: Negative for dizziness, sensory change and focal weakness.  All other systems reviewed and are negative.   Nutrition: Heart healthy/Carb control Tolerating Diet: Yes Tolerating PT: Ambulatory.   DRUG ALLERGIES:   Allergies  Allergen Reactions  . Prednisone Rash  . Trazodone Rash    VITALS:  Blood pressure 125/65, pulse 64, temperature 98.5 F (36.9 C), temperature source Oral, resp. rate 18, height 5' (1.524 m), weight 57.6 kg (127 lb), SpO2 93 %.  PHYSICAL EXAMINATION:   Physical Exam  GENERAL:  71 y.o.-year-old patient lying in bed in no acute distress.  EYES: Pupils equal, round, reactive to light. No scleral icterus. Extraocular muscles intact.  HEENT: Head atraumatic, normocephalic. Oropharynx and nasopharynx clear.  NECK:  Supple, no jugular venous distention. No thyroid enlargement, no tenderness.  LUNGS: Normal breath sounds bilaterally, no wheezing, rales, rhonchi. No use of accessory muscles of respiration.  CARDIOVASCULAR: S1, S2 normal. No murmurs, rubs, or gallops.  ABDOMEN: Soft, nontender,  nondistended. Bowel sounds present. No organomegaly or mass.  EXTREMITIES: No cyanosis, clubbing or edema b/l.   Right upper ext. Redness, warmth due to cellulitis.  NEUROLOGIC: Cranial nerves II through XII are intact. No focal Motor or sensory deficits b/l. + Intention Tremor  PSYCHIATRIC: The patient is alert and oriented x 3.  SKIN: No obvious rash, lesion, or ulcer. RUE cellulitis.    LABORATORY PANEL:   CBC  Recent Labs Lab 02/27/17 0445  WBC 10.3  HGB 12.2  HCT 34.3*  PLT 101*   ------------------------------------------------------------------------------------------------------------------  Chemistries   Recent Labs Lab 02/25/17 0515  NA 141  K 4.0  CL 107  CO2 28  GLUCOSE 119*  BUN 22*  CREATININE 0.77  CALCIUM 8.2*   ------------------------------------------------------------------------------------------------------------------  Cardiac Enzymes No results for input(s): TROPONINI in the last 168 hours. ------------------------------------------------------------------------------------------------------------------  RADIOLOGY:  US Venous Img Upper Uni Right  Result Date: 02/26/2017 CLINICAL DATA:  Right upper extremity swelling EXAM: RIGHT UPPER EXTREMITY VENOUS DOPPLER ULTRASOUND TECHNIQUE: Gray-scale sonography with graded compression, as well as color Doppler and duplex ultrasound were performed to evaluate the upper extremity deep venous system from the level of the subclavian vein and including the jugular, axillary, basilic, radial, ulnar and upper cephalic vein. Spectral Doppler was utilized to evaluate flow at rest and with distal augmentation maneuvers. COMPARISON:  None. FINDINGS: Contralateral Subclavian Vein: Respiratory phasicity is normal and symmetric with the symptomatic side. No evidence of thrombus. Normal compressibility. Internal Jugular Vein: No evidence of thrombus. Normal compressibility, respiratory phasicity and response to  augmentation. Subclavian Vein: No evidence of thrombus. Normal compressibility, respiratory phasicity and response to augmentation. Axillary Vein: No evidence of thrombus. Normal compressibility,  respiratory phasicity and response to augmentation. Cephalic Vein: No evidence of thrombus. Normal compressibility, respiratory phasicity and response to augmentation. Basilic Vein: No evidence of thrombus. Normal compressibility, respiratory phasicity and response to augmentation. Brachial Veins: No evidence of thrombus. Normal compressibility, respiratory phasicity and response to augmentation. Radial Veins: No evidence of thrombus. Normal compressibility, respiratory phasicity and response to augmentation. Ulnar Veins: No evidence of thrombus. Normal compressibility, respiratory phasicity and response to augmentation. Venous Reflux:  None visualized. Other Findings:  None visualized. IMPRESSION: No evidence of DVT within the right upper extremity. Electronically Signed   By: Rolm Baptise M.D.   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  1. Right upper extremity cellulitis-cause of patient's right upper extremity swelling and redness and pain. This is after her cat bite couple days back. Still has some redness/swelling in the right upper ext.  Doppler (-) for DVT.  - seen by ID and cont. To keep it elevated and cont. IV Unasyn and will d/c on Oral Augmentin if improved in next 24-48 hrs.  - WBC count down and pt. Is afebrile w/ cultures (-) so far.   2. Leukocytosis - due to # 1.  - normalized with IV abx.    3. Essential hypertension-continue losartan, HCTZ.  4. Hypothyroidism-continue Synthroid.  5. COPD-no acute exacerbation, continue Dulera, duonebs as  needed.   6. History of seizures-continue Depakote.  7. Anxiety-continue Klonopin.  Possible d/c home tomorrow.   All the records are reviewed and case discussed with Care Management/Social Worker. Management plans discussed with the patient, family and they are in agreement.  CODE STATUS: Full code  DVT Prophylaxis: Lovenox  TOTAL TIME TAKING CARE OF THIS PATIENT: 25 minutes.   POSSIBLE D/C IN 1-2 DAYS, DEPENDING ON CLINICAL CONDITION.   Henreitta Leber M.D on 02/27/2017 at 2:22 PM  Between 7am to 6pm - Pager - [phone]  After 6pm go to [email or web address] - Proofreader  [facility]  Office  [phone]  CC: Primary care physician; Madelyn Brunner, MD

## 2017-02-28 LAB — GLUCOSE, CAPILLARY
GLUCOSE-CAPILLARY: 87 mg/dL (ref 65–99)
GLUCOSE-CAPILLARY: 93 mg/dL (ref 65–99)

## 2017-02-28 MED ORDER — AMOXICILLIN-POT CLAVULANATE 875-125 MG PO TABS: 1.0000 | ORAL_TABLET | Freq: Two times a day (BID) | ORAL | 0 refills | Status: AC

## 2017-02-28 NOTE — Discharge Summary (Signed)
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 Cancer [facility])    hodgkins lymphoma dx 2011 chemo and rad tx finished in 2012  . COPD (chronic obstructive pulmonary disease) ([place])   . Degenerative disc disease, cervical   . Depression   . Diabetes mellitus ([place])   . GERD (gastroesophageal reflux disease)   . Hypertension   . Polio   . 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  1. Right upper extremity cellulitis- this was the cause of patient's right upper extremity swelling and redness and pain. This was after her cat bite couple days back.  -patient was treated with IV Unasyn, also underwent Dopplers of her upper extremity which was negative for DVT. After treatment with IV antibiotics patient's swelling and redness is improved., her white Cell count has normalized,her cultures remained negative. - he is therefore being discharged home on oral Augmentin for another few days and follow-up with her primary  care physician as an outpatient.  2. Leukocytosis - due to # 1.  - resolved and normalized w/ IV abx.     3. Essential hypertension- pt. Will continue losartan, HCTZ.  4. Hypothyroidism-pt. Will continue Synthroid.  5. COPD-no acute exacerbation - pt. Will resume her Adviar, duonebs as needed  6. History of seizures- pt. Will continue Depakote.  7. Anxiety- pt. Will continue Klonopin.  DISCHARGE CONDITIONS:   Stable.   CONSULTS OBTAINED:  Treatment Team:  Leonel Ramsay, MD  DRUG ALLERGIES:   Allergies  Allergen Reactions  . Prednisone Rash  . Trazodone Rash    DISCHARGE MEDICATIONS:   Allergies as of 02/28/2017      Reactions   Prednisone Rash   Trazodone Rash      Medication List    TAKE these medications   albuterol 108 (90 Base) MCG/ACT inhaler Commonly known as:  PROVENTIL HFA;VENTOLIN HFA Inhale 2 puffs into the lungs every 4 (four) hours as needed for wheezing or shortness of breath.   amoxicillin-clavulanate 875-125 MG tablet Commonly known as:  AUGMENTIN Take 1 tablet by mouth 2 (two) times daily.   aspirin EC 81 MG tablet Take 81 mg by mouth daily.   b complex vitamins tablet Take 1 tablet by mouth daily.   cholecalciferol 1000 units tablet Commonly known as:  VITAMIN D Take 1,000 Units by mouth daily.   clonazePAM 0.5 MG tablet Commonly known as:  KLONOPIN Take 1 tablet (0.5 mg total) by mouth 2 (two) times daily as needed for anxiety.   divalproex 250 MG  DR tablet Commonly known as:  DEPAKOTE Take 250 mg by mouth 2 (two) times daily.   Fluticasone-Salmeterol 250-50 MCG/DOSE Aepb Commonly known as:  ADVAIR DISKUS Inhale 1 puff into the lungs 2 (two) times daily.   ipratropium-albuterol 0.5-2.5 (3) MG/3ML Soln Commonly known as:  DUONEB Take 3 mLs by nebulization every 4 (four) hours as needed.   levothyroxine 75 MCG tablet Commonly known as:  SYNTHROID, LEVOTHROID Take 75 mcg by mouth daily before breakfast.    losartan 100 MG tablet Commonly known as:  COZAAR Take 100 mg by mouth daily.   metoprolol tartrate 25 MG tablet Commonly known as:  LOPRESSOR Take 0.5 tablets (12.5 mg total) by mouth 2 (two) times daily.   MYRBETRIQ 50 MG Tb24 tablet Generic drug:  mirabegron ER Take 50 mg by mouth at bedtime.   PARoxetine 20 MG tablet Commonly known as:  PAXIL Take 20 mg by mouth daily.   senna-docusate 8.6-50 MG tablet Commonly known as:  Senokot-S Take 2 tablets by mouth daily as needed.            Discharge Care Instructions        Start     Ordered   02/28/17 0000  Activity as tolerated - No restrictions     02/28/17 0927   02/28/17 0000  Diet - low sodium heart healthy     02/28/17 0927   02/28/17 0000  amoxicillin-clavulanate (AUGMENTIN) 875-125 MG tablet  2 times daily     02/28/17 0927        DISCHARGE INSTRUCTIONS:   DIET:  Cardiac diet  DISCHARGE CONDITION:  Stable  ACTIVITY:  Activity as tolerated  OXYGEN:  Home Oxygen: No.   Oxygen Delivery: room air  DISCHARGE LOCATION:  home   If you experience worsening of your admission symptoms, develop shortness of breath, life threatening emergency, suicidal or homicidal thoughts you must seek medical attention immediately by calling 911 or calling your MD immediately  if symptoms less severe.  You Must read complete instructions/literature along with all the possible adverse reactions/side effects for all the Medicines you take and that have been prescribed to you. Take any new Medicines after you have completely understood and accpet all the possible adverse reactions/side effects.   Please note  You were cared for by a hospitalist during your hospital stay. If you have any questions about your discharge medications or the care you received while you were in the hospital after you are discharged, you can call the unit and asked to speak with the hospitalist on call if the hospitalist that took care of you is  not available. Once you are discharged, your primary care physician will handle any further medical issues. Please note that NO REFILLS for any discharge medications will be authorized once you are discharged, as it is imperative that you return to your primary care physician (or establish a relationship with a primary care physician if you do not have one) for your aftercare needs so that they can reassess your need for medications and monitor your lab values.     Today   Pt's Right upper Ext. Swelling has improved.  Less swelling but still has pain.   VITAL SIGNS:  Blood pressure (!) 154/69, pulse 64, temperature 98.8 F (37.1 C), temperature source Oral, resp. rate 18, height 5' (1.524 m), weight 57.6 kg (127 lb), SpO2 95 %.  I/O:   Intake/Output Summary (Last 24 hours) at 02/28/17 1520 Last data filed at 02/28/17 [id number]  Gross per 24 hour  Intake              120 ml  Output                0 ml  Net              120 ml    PHYSICAL EXAMINATION:   GENERAL:  71 y.o.-year-old patient lying in bed in no acute distress.  EYES: Pupils equal, round, reactive to light. No scleral icterus. Extraocular muscles intact.  HEENT: Head atraumatic, normocephalic. Oropharynx and nasopharynx clear.  NECK:  Supple, no jugular venous distention. No thyroid enlargement, no tenderness.  LUNGS: Normal breath sounds bilaterally, no wheezing, rales, rhonchi. No use of accessory muscles of respiration.  CARDIOVASCULAR: S1, S2 normal. No murmurs, rubs, or gallops.  ABDOMEN: Soft, nontender, nondistended. Bowel sounds present. No organomegaly or mass.  EXTREMITIES: No cyanosis, clubbing or edema b/l.   Right upper ext. Redness, warmth improved.  NEUROLOGIC: Cranial nerves II through XII are intact. No focal Motor or sensory deficits b/l. + Intention Tremor  PSYCHIATRIC: The patient is alert and oriented x 3.  SKIN: No obvious rash, lesion, or ulcer. RUE cellulitis improved.   DATA REVIEW:   CBC  Recent  Labs Lab 02/27/17 0445  WBC 10.3  HGB 12.2  HCT 34.3*  PLT 101*    Chemistries   Recent Labs Lab 02/25/17 0515  NA 141  K 4.0  CL 107  CO2 28  GLUCOSE 119*  BUN 22*  CREATININE 0.77  CALCIUM 8.2*    Cardiac Enzymes No results for input(s): TROPONINI in the last 168 hours.  Microbiology Results  Results for orders placed or performed during the hospital encounter of 02/24/17  Culture, blood (routine x 2)     Status: None (Preliminary result)   Collection Time: 02/24/17 10:54 PM  Result Value Ref Range Status   Specimen Description BLOOD LFOA  Final   Special Requests   Final    BOTTLES DRAWN AEROBIC AND ANAEROBIC Blood Culture results may not be optimal due to an excessive volume of blood received in culture bottles   Culture NO GROWTH 4 DAYS  Final   Report Status PENDING  Incomplete  Culture, blood (routine x 2)     Status: None (Preliminary result)   Collection Time: 02/24/17 10:54 PM  Result Value Ref Range Status   Specimen Description BLOOD LAC  Final   Special Requests   Final    BOTTLES DRAWN AEROBIC AND ANAEROBIC Blood Culture results may not be optimal due to an excessive volume of blood received in culture bottles   Culture NO GROWTH 4 DAYS  Final   Report Status PENDING  Incomplete    RADIOLOGY:  No results found.    Management plans discussed with the patient, family and they are in agreement.  CODE STATUS:     Code Status Orders        Start     Ordered   02/25/17 0058  Full code  Continuous     02/25/17 0058    Code Status History    Date Active Date Inactive Code Status Order ID Comments User Context   11/13/2015  3:55 PM 11/20/2015  8:27 PM Full Code [id number]  Bettey Costa, MD ED      TOTAL TIME TAKING CARE OF THIS PATIENT: 40 minutes.    Henreitta Leber M.D on 02/28/2017 at 3:20 PM  Between 7am to 6pm - Pager - [phone]  After 6pm go to [email or web address] - Proofreader  [facility]  Office   [phone]  CC: Primary care physician; Madelyn Brunner, MD

## 2017-02-28 NOTE — Progress Notes (Signed)
Pt alert and oriented. Medicated for pain x1 with good results. Pt arm elevated on pillows. Still with some redness and swelling.

## 2017-02-28 NOTE — Progress Notes (Signed)
Patient is being discharged to home. DC and RX instructions given and patient acknowledged understanding. Patient had no questions. IV removed, belongings packed. Patient waiting for ride home.

## 2017-02-28 NOTE — Care Management Important Message (Signed)
Important Message  Patient Details  Name: [name] MRN: [id number] Date of Birth: 05-06-1946   Medicare Important Message Given:  Yes    Jolly Mango, RN 02/28/2017, 9:28 AM

## 2017-02-28 NOTE — Progress Notes (Signed)
[place] INFECTIOUS DISEASE PROGRESS NOTE Date of Admission:  02/24/2017     ID: [name] is a 71 y.o. 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 Some prickly feeling  ROS  Eleven systems are reviewed and negative except per hpi  Medications:  Antibiotics Given (last 72 hours)    Date/Time Action Medication Dose Rate   02/25/17 1658 New Bag/Given   Ampicillin-Sulbactam (UNASYN) 3 g in sodium chloride 0.9 % 100 mL IVPB 3 g 200 mL/hr   02/25/17 2220 New Bag/Given   Ampicillin-Sulbactam (UNASYN) 3 g in sodium chloride 0.9 % 100 mL IVPB 3 g 200 mL/hr   02/26/17 0556 New Bag/Given   Ampicillin-Sulbactam (UNASYN) 3 g in sodium chloride 0.9 % 100 mL IVPB 3 g 200 mL/hr   02/26/17 1108 New Bag/Given   Ampicillin-Sulbactam (UNASYN) 3 g in sodium chloride 0.9 % 100 mL IVPB 3 g 200 mL/hr   02/26/17 1458 New Bag/Given   Ampicillin-Sulbactam (UNASYN) 3 g in sodium chloride 0.9 % 100 mL IVPB 3 g 200 mL/hr   02/26/17 2222 New Bag/Given   Ampicillin-Sulbactam (UNASYN) 3 g in sodium chloride 0.9 % 100 mL IVPB 3 g 200 mL/hr   02/27/17 0520 New Bag/Given   Ampicillin-Sulbactam (UNASYN) 3 g in sodium chloride 0.9 % 100 mL IVPB 3 g 200 mL/hr   02/27/17 8676 New Bag/Given   Ampicillin-Sulbactam (UNASYN) 3 g in sodium chloride 0.9 % 100 mL IVPB 3 g 200 mL/hr   02/27/17 1631 New Bag/Given   Ampicillin-Sulbactam (UNASYN) 3 g in sodium chloride 0.9 % 100 mL IVPB 3 g 200 mL/hr   02/27/17 2318 New Bag/Given   Ampicillin-Sulbactam (UNASYN) 3 g in sodium chloride 0.9 % 100 mL IVPB 3 g 200 mL/hr   02/28/17 0517 New Bag/Given   Ampicillin-Sulbactam (UNASYN) 3 g in sodium chloride 0.9 %  100 mL IVPB 3 g 200 mL/hr     . aspirin EC  81 mg Oral Daily  . divalproex  250 mg Oral BID  . enoxaparin (LOVENOX) injection  40 mg Subcutaneous Q24H  . insulin aspart  0-5 Units Subcutaneous QHS  . insulin aspart  0-9 Units Subcutaneous TID WC  . levothyroxine  75 mcg Oral QAC breakfast  . losartan  100 mg Oral Daily  . metoprolol tartrate  12.5 mg Oral BID  . mirabegron ER  50 mg Oral QHS  . mometasone-formoterol  2 puff Inhalation BID  . PARoxetine  20 mg Oral Daily    Objective: Vital signs in last 24 hours: Temp:  [97.8 F (36.6 C)-98.9 F (37.2 C)] 98.8 F (37.1 C) (09/14 1202) Pulse Rate:  [58-86] 64 (09/14 1202) Resp:  [18-19] 18 (09/14 1202) BP: (139-154)/(61-94) 154/69 (09/14 1202) SpO2:  [94 %-98 %] 95 % (09/14 1202) Constitutional:  oriented to person, place, and time. Thin, frail, tremors HENT: [place]/AT, PERRLA, no scleral icterus Mouth/Throat: Oropharynx is clear and moist. No oropharyngeal exudate.  Cardiovascular: Normal rate, regular rhythm and normal heart sounds. Exam reveals no gallop and no friction rub.  No murmur heard.  Pulmonary/Chest: Effort normal and breath sounds normal. No respiratory distress.  has no wheezes.  Neck =  supple, no nuchal rigidity Abdominal: Soft. Bowel sounds are normal.  exhibits no distension. There is no tenderness.  Lymphadenopathy: no cervical adenopathy. No axillary adenopathy Neurological: alert and oriented to person, place, and time.  R arm with forearm with improvement in edema but has soft pitting edema up past elbow Skin: L forearmmuch  less red, extending down into dorsum of hand and up a little past elbow. Area is diffusely midly tender but no obvious purulence or fluctuance. Psychiatric: a normal mood and affect.  behavior is normal.   Lab Results  Recent Labs  02/26/17 0519 02/27/17 0445  WBC 13.8* 10.3  HGB 12.9 12.2  HCT 36.6 34.3*    Microbiology: Results for orders placed or performed during the  hospital encounter of 02/24/17  Culture, blood (routine x 2)     Status: None (Preliminary result)   Collection Time: 02/24/17 10:54 PM  Result Value Ref Range Status   Specimen Description BLOOD LFOA  Final   Special Requests   Final    BOTTLES DRAWN AEROBIC AND ANAEROBIC Blood Culture results may not be optimal due to an excessive volume of blood received in culture bottles   Culture NO GROWTH 4 DAYS  Final   Report Status PENDING  Incomplete  Culture, blood (routine x 2)     Status: None (Preliminary result)   Collection Time: 02/24/17 10:54 PM  Result Value Ref Range Status   Specimen Description BLOOD LAC  Final   Special Requests   Final    BOTTLES DRAWN AEROBIC AND ANAEROBIC Blood Culture results may not be optimal due to an excessive volume of blood received in culture bottles   Culture NO GROWTH 4 DAYS  Final   Report Status PENDING  Incomplete    Studies/Results: US Venous Img Upper Uni Right  Result Date: 02/26/2017 CLINICAL DATA:  Right upper extremity swelling EXAM: RIGHT UPPER EXTREMITY VENOUS DOPPLER ULTRASOUND TECHNIQUE: Gray-scale sonography with graded compression, as well as color Doppler and duplex ultrasound were performed to evaluate the upper extremity deep venous system from the level of the subclavian vein and including the jugular, axillary, basilic, radial, ulnar and upper cephalic vein. Spectral Doppler was utilized to evaluate flow at rest and with distal augmentation maneuvers. COMPARISON:  None. FINDINGS: Contralateral Subclavian Vein: Respiratory phasicity is normal and symmetric with the symptomatic side. No evidence of thrombus. Normal compressibility. Internal Jugular Vein: No evidence of thrombus. Normal compressibility, respiratory phasicity and response to augmentation. Subclavian Vein: No evidence of thrombus. Normal compressibility, respiratory phasicity and response to augmentation. Axillary Vein: No evidence of thrombus. Normal compressibility,  respiratory phasicity and response to augmentation. Cephalic Vein: No evidence of thrombus. Normal compressibility, respiratory phasicity and response to augmentation. Basilic Vein: No evidence of thrombus. Normal compressibility, respiratory phasicity and response to augmentation. Brachial Veins: No evidence of thrombus. Normal compressibility, respiratory phasicity and response to augmentation. Radial Veins: No evidence of thrombus. Normal compressibility, respiratory phasicity and response to augmentation. Ulnar Veins: No evidence of thrombus. Normal compressibility, respiratory phasicity and response to augmentation. Venous Reflux:  None visualized. Other Findings:  None visualized. IMPRESSION: No evidence of DVT within the right upper extremity. Electronically Signed   By: Rolm Baptise M.D.   On: 02/26/2017 13:48    Assessment/Plan: [name] is a 71 y.o. female with RUE cellulitis following a cat bite with rapid onset and wbc 20, low grade temps. She has significant infection of her R forearm but seems to be sparing the hand fortunately.  Nothing obvious to drain. Main issue is the pain and edema. Is on unasyn which is appropriate. [place]. WBC down to 10.   Recommendations Continue elevating the arm above the level of the heart with 5-6 pillows.  Change to augmentin and can dc Monitor for abscess development.   Thank you very much for the consult. Will follow with you.  Leonel Ramsay   02/28/2017, 12:44 PM

## 2017-02-28 NOTE — Progress Notes (Signed)
Pharmacy Antibiotic Note  [name] is a 71 y.o. female admitted on 02/24/2017 with cellulitis.  Pharmacy  consulted for unasyn dosing.  Plan: Unasyn 3g IV q6h  Height: 5' (152.4 cm) Weight: 127 lb (57.6 kg) IBW/kg (Calculated) : 45.5  Temp (24hrs), Avg:98.2 F (36.8 C), Min:97.8 F (36.6 C), Max:98.9 F (37.2 C)   Recent Labs Lab 02/24/17 2254 02/25/17 0515 02/26/17 0519 02/27/17 0445  WBC 20.9* 17.0* 13.8* 10.3  CREATININE 0.61 0.77  --   --     Estimated Creatinine Clearance: 52 mL/min (by C-G formula based on SCr of 0.77 mg/dL).    Allergies  Allergen Reactions  . Prednisone Rash  . Trazodone Rash    Thank you for allowing pharmacy to be a part of this patient's care.  Larene Beach, PharmD  Clinical Pharmacist 02/28/2017

## 2017-03-01 LAB — CULTURE, BLOOD (ROUTINE X 2)
CULTURE: NO GROWTH
Culture: NO GROWTH

## 2017-03-10 ENCOUNTER — Ambulatory Visit (INDEPENDENT_AMBULATORY_CARE_PROVIDER_SITE_OTHER): Payer: Medicare Other | Admitting: Pulmonary Disease

## 2017-03-10 ENCOUNTER — Encounter: Payer: Self-pay | Admitting: Pulmonary Disease

## 2017-03-10 VITALS — BP 118/82 | HR 71 | Resp 16 | Ht 60.0 in | Wt 125.0 lb

## 2017-03-10 DIAGNOSIS — F17200 Nicotine dependence, unspecified, uncomplicated: Secondary | ICD-10-CM | POA: Diagnosis not present

## 2017-03-10 DIAGNOSIS — J449 Chronic obstructive pulmonary disease, unspecified: Secondary | ICD-10-CM

## 2017-03-10 DIAGNOSIS — G4734 Idiopathic sleep related nonobstructive alveolar hypoventilation: Secondary | ICD-10-CM | POA: Diagnosis not present

## 2017-03-10 MED ORDER — NICOTINE POLACRILEX 2 MG MT LOZG: 2.0000 mg | LOZENGE | OROMUCOSAL | 10 refills | Status: AC | PRN

## 2017-03-10 NOTE — Patient Instructions (Addendum)
We discussed smoking cessation and nicotine replacement therapy. We decided on nicotine lozenges (2 mg) - use as needed for nicotine withdrawal symptoms. I have placed a prescription in hopes that you can get some insurance coverage  We will track down the results of your overnight oximetry test and contact you with recommendations regarding oxygen therapy  Follow-up in 4 months or sooner as needed

## 2017-03-11 NOTE — Progress Notes (Signed)
PULMONARY OFFICE FOLLOW-UP  SUMMARY: 71 y.o. F smoker with post-polio syndrome, COPD hospitalized 05/29-06/05/17 for AECOPD. Seen in consultation by Dr Mortimer Fries. Discharged home on Advair, prednisone taper, PRN nebulized bronchodilators and O2.   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 Routine re-eval. No new complaints. Continues to smoke 1/2-1 PPD> demonstrates little desire to quit. Remains on Advair. Denies CP, fever, purulent sputum, hemoptysis, LE edema and calf tenderness.   OBJ: Vitals:   03/10/17 1137  BP: 118/82  Pulse: 71  Resp: 16  SpO2: 90%  Weight: 56.7 kg (125 lb)  Height: 5' (1.524 m)  RA  Gen: NAD HEENT: WNL Neck: No LAN, no JVD noted Lungs: No wheezes or other adventitious sounds Cardiovascular: Reg, no M Abdomen: Soft, NT, +BS Ext: no C/C/E, left lower extremities in brace Neuro: Coarse tremor  DATA:   BMP Latest Ref Rng & Units 02/25/2017 02/24/2017 07/15/2016  Glucose 65 - 99 mg/dL 119(H) 150(H) 96  BUN 6 - 20 mg/dL 22(H) 17 20  Creatinine 0.44 - 1.00 mg/dL 0.77 0.61 0.45  Sodium 135 - 145 mmol/L 141 138 136  Potassium 3.5 - 5.1 mmol/L 4.0 3.8 4.3  Chloride 101 - 111 mmol/L 107 104 103  CO2 22 - 32 mmol/L 28 27 29   Calcium 8.9 - 10.3 mg/dL 8.2(L) 8.9 9.0    CBC Latest Ref Rng & Units 02/27/2017 02/26/2017 02/25/2017  WBC 3.6 - 11.0 K/uL 10.3 13.8(H) 17.0(H)  Hemoglobin 12.0 - 16.0 g/dL 12.2 12.9 13.6  Hematocrit 35.0 - 47.0 % 34.3(L) 36.6 39.6  Platelets 150 - 440 K/uL 101(L) 108(L) 127(L)   No new CXR   IMPRESSION:   1) COPD/emphysema  2) Recalcitrant smoker  3) 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 We decided on nicotine lozenges (2 mg) to be used as needed for nicotine withdrawal symptoms. I have placed a prescription in hopes that she can get some insurance coverage  Continue Advair and PRN albuterol  We need to track down the results of her overnight oximetry test and contact her with recommendations regarding nocturnal oxygen therapy  Follow-up in 4 months or sooner as needed  Merton Border, MD PCCM service Mobile [phone] Pager [phone] 03/11/2017

## 2017-03-13 ENCOUNTER — Telehealth: Payer: Self-pay | Admitting: Pulmonary Disease

## 2017-03-13 NOTE — Telephone Encounter (Signed)
Pt daughter calling stating the past 4-5 days  Pt has had a bad cough  Its not getting any better Would like some advise to help patient  Please call back

## 2017-03-13 NOTE — Telephone Encounter (Signed)
Dr. Alva Garnet saw this patient on 03/10/17, please advise.   Pt daughter calling stating the past 4-5 days  Pt has had a bad cough  Its not getting any better Would like some advise to help patient  Please call back

## 2017-03-14 NOTE — Telephone Encounter (Signed)
Per DS, pt is taking Delsym 70ml BID PRN for 3-5 days and if no better to call us back next week.  Spoke with pt and informed her to take the Delsym per DS. Pt verbalized understanding. Nothing further needed.

## 2017-04-14 ENCOUNTER — Encounter: Payer: Self-pay | Admitting: *Deleted

## 2017-04-14 ENCOUNTER — Emergency Department
Admission: EM | Admit: 2017-04-14 | Discharge: 2017-04-14 | Disposition: A | Discharge status: Home / Self Care | Payer: Medicare Other | Attending: Emergency Medicine | Admitting: Emergency Medicine

## 2017-04-14 ENCOUNTER — Emergency Department: Payer: Medicare Other

## 2017-04-14 DIAGNOSIS — J441 Chronic obstructive pulmonary disease with (acute) exacerbation: Secondary | ICD-10-CM

## 2017-04-14 DIAGNOSIS — I10 Essential (primary) hypertension: Secondary | ICD-10-CM | POA: Diagnosis not present

## 2017-04-14 DIAGNOSIS — Z79899 Other long term (current) drug therapy: Secondary | ICD-10-CM | POA: Diagnosis not present

## 2017-04-14 DIAGNOSIS — E039 Hypothyroidism, unspecified: Secondary | ICD-10-CM | POA: Insufficient documentation

## 2017-04-14 DIAGNOSIS — R05 Cough: Secondary | ICD-10-CM | POA: Diagnosis present

## 2017-04-14 DIAGNOSIS — F17210 Nicotine dependence, cigarettes, uncomplicated: Secondary | ICD-10-CM | POA: Diagnosis not present

## 2017-04-14 DIAGNOSIS — Z7982 Long term (current) use of aspirin: Secondary | ICD-10-CM | POA: Diagnosis not present

## 2017-04-14 DIAGNOSIS — E119 Type 2 diabetes mellitus without complications: Secondary | ICD-10-CM | POA: Insufficient documentation

## 2017-04-14 LAB — BASIC METABOLIC PANEL
Anion gap: 5 (ref 5–15)
BUN: 18 mg/dL (ref 6–20)
CALCIUM: 9.2 mg/dL (ref 8.9–10.3)
CO2: 30 mmol/L (ref 22–32)
Chloride: 104 mmol/L (ref 101–111)
Creatinine, Ser: 0.64 mg/dL (ref 0.44–1.00)
GFR calc Af Amer: >60 mL/min (ref >60)
GLUCOSE: 109 mg/dL — AB (ref 65–99)
Potassium: 3.6 mmol/L (ref 3.5–5.1)
Sodium: 139 mmol/L (ref 135–145)

## 2017-04-14 LAB — CBC
HCT: 42.4 % (ref 35.0–47.0)
Hemoglobin: 14.4 g/dL (ref 12.0–16.0)
MCH: 33.5 pg (ref 26.0–34.0)
MCHC: 34 g/dL (ref 32.0–36.0)
MCV: 98.3 fL (ref 80.0–100.0)
Platelets: 161 10*3/uL (ref 150–440)
RBC: 4.31 MIL/uL (ref 3.80–5.20)
RDW: 13.8 % (ref 11.5–14.5)
WBC: 7.4 10*3/uL (ref 3.6–11.0)

## 2017-04-14 MED ORDER — AZITHROMYCIN 500 MG PO TABS
500.0000 mg | ORAL_TABLET | Freq: Once | ORAL | Status: AC
  Administered 2017-04-14: 500 mg via ORAL
  Filled 2017-04-14: qty 1

## 2017-04-14 MED ORDER — IPRATROPIUM-ALBUTEROL 0.5-2.5 (3) MG/3ML IN SOLN
3.0000 mL | Freq: Once | RESPIRATORY_TRACT | Status: AC
  Administered 2017-04-14: 3 mL via RESPIRATORY_TRACT

## 2017-04-14 MED ORDER — PREDNISONE 20 MG PO TABS
60.0000 mg | ORAL_TABLET | Freq: Once | ORAL | Status: AC
  Administered 2017-04-14: 60 mg via ORAL
  Filled 2017-04-14: qty 3

## 2017-04-14 MED ORDER — IPRATROPIUM-ALBUTEROL 0.5-2.5 (3) MG/3ML IN SOLN
3.0000 mL | Freq: Once | RESPIRATORY_TRACT | Status: AC
  Administered 2017-04-14: 3 mL via RESPIRATORY_TRACT
  Filled 2017-04-14: qty 9

## 2017-04-14 MED ORDER — BENZONATATE 100 MG PO CAPS: 100.0000 mg | ORAL_CAPSULE | Freq: Four times a day (QID) | ORAL | 0 refills | Status: AC | PRN

## 2017-04-14 MED ORDER — AZITHROMYCIN 250 MG PO TABS: ORAL_TABLET | ORAL | 0 refills | Status: AC

## 2017-04-14 MED ORDER — PREDNISONE 10 MG PO TABS: 50.0000 mg | ORAL_TABLET | Freq: Every day | ORAL | 0 refills | Status: AC

## 2017-04-14 NOTE — ED Provider Notes (Signed)
[facility] Emergency Department Provider Note  ____________________________________________   First MD Initiated Contact with Patient 04/14/17 1758     (approximate)  I have reviewed the triage vital signs and the nursing notes.  HISTORY  Chief Complaint Cough and Weakness   HPI [name] is a 71 y.o. female who comes to the emergency department with roughly 72 hours of gradual onset and slowly progressive cough and shortness of breath.  She comes in saying "my COPD is acting up again".  Her cough is slowly become increasingly productive.  She does use oxygen at home at night but says she has been using it throughout the day.  She denies fevers or chills.  She does have moderate severity sharp chest pain worse when coughing improved and not coughing.  Past Medical History:  Diagnosis Date  . Cancer [facility])    hodgkins lymphoma dx 2011 chemo and rad tx finished in 2012  . COPD (chronic obstructive pulmonary disease) ([place])   . Degenerative disc disease, cervical   . Depression   . Diabetes mellitus ([place])   . GERD (gastroesophageal reflux disease)   . Hypertension   . Polio   . Postpolio syndrome    left leg paralysis    Patient Active Problem List   Diagnosis Date Noted  . 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 Personal history of tobacco use, presenting hazards to health 09/02/2016  . Malnutrition of moderate degree 11/16/2015  . COPD exacerbation ([place]) 11/13/2015  . Right ankle pain 02/01/2015  . Hypothyroidism due to acquired atrophy of thyroid 01/17/2015  . Microalbuminuria 01/17/2015  . Lymphoma ([place]) 12/13/2014  . Tremor 07/04/2014  . Difficulty in walking 02/15/2014  . Fatigue 02/15/2014  . Seizures ([place]) 02/07/2014  . Sleep disorder 02/07/2014  . Chronic obstructive airway disease with asthma ([place]) 12/06/2013  . Diabetes mellitus type 2, controlled ([place]) 12/06/2013  . Esophageal reflux 12/06/2013  . Benign essential  hypertension 12/06/2013  . History of lymphoma 12/06/2013  . Hypercholesterolemia 12/06/2013  . Post-polio syndrome 12/06/2013    Past Surgical History:  Procedure Laterality Date  . ABDOMINAL HYSTERECTOMY    . BLADDER SURGERY    . BREAST LUMPECTOMY    . CARPAL TUNNEL RELEASE  2004   Bilateral  . CHOLECYSTECTOMY    . RECTAL PROLAPSE REPAIR  April 2010    Prior to Admission medications   Medication Sig Start Date End Date Taking? Authorizing Provider  albuterol (PROVENTIL HFA;VENTOLIN HFA) 108 (90 Base) MCG/ACT inhaler Inhale 2 puffs into the lungs every 4 (four) hours as needed for wheezing or shortness of breath.    [provider]  aspirin EC 81 MG tablet Take 81 mg by mouth daily.    [provider]  azithromycin (ZITHROMAX Z-PAK) 250 MG tablet Take 2 tablets (500 mg) on  Day 1,  followed by 1 tablet (250 mg) once daily on Days 2 through 5. 04/14/17 04/19/17  Darel Hong, MD  b complex vitamins tablet Take 1 tablet by mouth daily.    [provider]  benzonatate (TESSALON PERLES) 100 MG capsule Take 1 capsule (100 mg total) by mouth every 6 (six) hours as needed for cough. 04/14/17 04/14/18  Darel Hong, MD  cholecalciferol (VITAMIN D) 1000 units tablet Take 1,000 Units by mouth daily.    [provider]  clonazePAM (KLONOPIN) 0.5 MG tablet Take 1 tablet (0.5 mg total) by mouth 2 (two) times daily as needed for anxiety. 11/20/15  Hillary Bow, MD  divalproex (DEPAKOTE) 250 MG DR tablet Take 250 mg by mouth 2 (two) times daily.     [provider]  Fluticasone-Salmeterol (ADVAIR DISKUS) 250-50 MCG/DOSE AEPB Inhale 1 puff into the lungs 2 (two) times daily. 11/20/15   Sudini, Alveta Heimlich, MD  ipratropium-albuterol (DUONEB) 0.5-2.5 (3) MG/3ML SOLN Take 3 mLs by nebulization every 4 (four) hours as needed. 11/20/15   Hillary Bow, MD  levothyroxine (SYNTHROID, LEVOTHROID) 75 MCG tablet Take 75 mcg by mouth daily before breakfast.  07/10/16    [provider]  losartan (COZAAR) 100 MG tablet Take 100 mg by mouth daily.     [provider]  metoprolol tartrate (LOPRESSOR) 25 MG tablet Take 0.5 tablets (12.5 mg total) by mouth 2 (two) times daily. 11/20/15   Hillary Bow, MD  mirabegron ER (MYRBETRIQ) 50 MG TB24 tablet Take 50 mg by mouth at bedtime.     [provider]  nicotine polacrilex (COMMIT) 2 MG lozenge Take 1 lozenge (2 mg total) by mouth as needed for smoking cessation. 03/10/17   Wilhelmina Mcardle, MD  PARoxetine (PAXIL) 20 MG tablet Take 20 mg by mouth daily.  07/05/16   [provider]  predniSONE (DELTASONE) 10 MG tablet Take 5 tablets (50 mg total) by mouth daily. 04/14/17 04/18/17  Darel Hong, MD  senna-docusate (SENOKOT-S) 8.6-50 MG tablet Take 2 tablets by mouth daily as needed.    [provider]    Allergies Trazodone  Family History  Problem Relation Age of Onset  . Cancer Father   . Cancer Paternal Grandmother   . Diabetes Mother   . Emphysema Mother     Social History Social History  Substance Use Topics  . Smoking status: Current Every Day Smoker    Packs/day: 0.50    Years: 57.00    Types: Cigarettes  . Smokeless tobacco: Never Used     Comment: quit smoking 11/13/15  . Alcohol use No    Review of Systems Constitutional: No fever/chills Eyes: No visual changes. ENT: No sore throat. Cardiovascular: Positive for chest pain. Respiratory: Positive for shortness of breath. Gastrointestinal: No abdominal pain.  No nausea, no vomiting.  No diarrhea.  No constipation. Genitourinary: Negative for dysuria. Musculoskeletal: Negative for back pain. Skin: Negative for rash. Neurological: Negative for headaches, focal weakness or numbness.   ____________________________________________   PHYSICAL EXAM:  VITAL SIGNS: ED Triage Vitals  Enc Vitals Group     BP 04/14/17 1646 122/69     Pulse Rate 04/14/17 1646 70     Resp 04/14/17 1646 18     Temp  04/14/17 1646 98.6 F (37 C)     Temp Source 04/14/17 1646 Oral     SpO2 04/14/17 1646 95 %     Weight 04/14/17 1619 123 lb (55.8 kg)     Height 04/14/17 1619 5' (1.524 m)     Head Circumference --      Peak Flow --      Pain Score 04/14/17 1619 5     Pain Loc --      Pain Edu? --      Excl. in [place]? --     Constitutional: Alert and oriented x4 appears somewhat uncomfortable although no respiratory distress and speaks in full clear sentences Eyes: PERRL EOMI. Head: Atraumatic. Nose: No congestion/rhinnorhea. Mouth/Throat: No trismus Neck: No stridor.   Cardiovascular: Normal rate, regular rhythm. Grossly normal heart sounds.  Good peripheral circulation. Respiratory: Normal respiratory effort.  No  retractions.  Diffuse expiratory wheeze with prolonged expiratory phase in all fields although moving very good air Gastrointestinal: Soft nontender Musculoskeletal: No lower extremity edema   Neurologic:  Normal speech and language.  Baseline resting tremor Skin:  Skin is warm, dry and intact. No rash noted. Psychiatric: Mood and affect are normal. Speech and behavior are normal.    ____________________________________________   DIFFERENTIAL includes but not limited to  COPD exacerbation, pneumothorax, lobar collapse, pneumonia, pulmonary embolism ____________________________________________   LABS (all labs ordered are listed, but only abnormal results are displayed)  Labs Reviewed  BASIC METABOLIC PANEL - Abnormal; Notable for the following:       Result Value   Glucose, Bld 109 (*)    All other components within normal limits  CBC  URINALYSIS, COMPLETE (UACMP) WITH MICROSCOPIC    Blood work reviewed and interpreted by me with no acute disease __________________________________________  EKG  ED ECG REPORT I, Darel Hong, the attending physician, personally viewed and interpreted this ECG.  Date: 04/14/2017 EKG Time:  Rate: 72 Rhythm: normal sinus rhythm QRS  Axis: normal Intervals: normal ST/T Wave abnormalities: normal Narrative Interpretation: no evidence of acute ischemia  ____________________________________________  RADIOLOGY  Chest x-ray reviewed by me with chronic changes but no acute disease noted ____________________________________________   PROCEDURES  Procedure(s) performed: no  Procedures  Critical Care performed: no  Observation: no ____________________________________________   INITIAL IMPRESSION / ASSESSMENT AND PLAN / ED COURSE  Pertinent labs & imaging results that were available during my care of the patient were reviewed by me and considered in my medical decision making (see chart for details).  The patient arrives coughing with expiratory wheezes throughout.  Her symptoms are most consistent with a COPD exacerbation.  Her chest x-ray is clear with no signs of pneumonia.  At this point I will treat her symptomatically with 3 duo nebs, steroids, given her increasingly productive cough also with azithromycin.  Anticipate discharge home.    ----------------------------------------- 7:20 PM on 04/14/2017 -----------------------------------------  After 3 breathing treatments and steroids the patient feels considerably improved.  She is saturating 94% on 2 L and has oxygen at home.  I will discharge her with a 4-day course of prednisone as well as azithromycin.  She is discharged home with family and the patient verbalizes understanding and agreement with plan.  ____________________________________________   FINAL CLINICAL IMPRESSION(S) / ED DIAGNOSES  Final diagnoses:  COPD exacerbation ([place])      NEW MEDICATIONS STARTED DURING THIS VISIT:  New Prescriptions   AZITHROMYCIN (ZITHROMAX Z-PAK) 250 MG TABLET    Take 2 tablets (500 mg) on  Day 1,  followed by 1 tablet (250 mg) once daily on Days 2 through 5.   BENZONATATE (TESSALON PERLES) 100 MG CAPSULE    Take 1 capsule (100 mg total) by mouth every 6  (six) hours as needed for cough.   PREDNISONE (DELTASONE) 10 MG TABLET    Take 5 tablets (50 mg total) by mouth daily.     Note:  This document was prepared using Dragon voice recognition software and may include unintentional dictation errors.     Darel Hong, MD 04/14/17 1921

## 2017-04-14 NOTE — Discharge Instructions (Signed)
Please take all of your steroids and antibiotics as prescribed and follow-up with your primary care physician as needed.  Return to the emergency department sooner for any new or worsening symptoms such as fevers, chills, worsening shortness of breath, or for any other issues whatsoever.  It was a pleasure to take care of you today, and thank you for coming to our emergency department.  If you have any questions or concerns before leaving please ask the nurse to grab me and I'm more than happy to go through your aftercare instructions again.  If you were prescribed any opioid pain medication today such as Norco, Vicodin, Percocet, morphine, hydrocodone, or oxycodone please make sure you do not drive when you are taking this medication as it can alter your ability to drive safely.  If you have any concerns once you are home that you are not improving or are in fact getting worse before you can make it to your follow-up appointment, please do not hesitate to call 911 and come back for further evaluation.  Darel Hong, MD  Results for orders placed or performed during the hospital encounter of 76/54/65  Basic metabolic panel  Result Value Ref Range   Sodium 139 135 - 145 mmol/L   Potassium 3.6 3.5 - 5.1 mmol/L   Chloride 104 101 - 111 mmol/L   CO2 30 22 - 32 mmol/L   Glucose, Bld 109 (H) 65 - 99 mg/dL   BUN 18 6 - 20 mg/dL   Creatinine, Ser 0.64 0.44 - 1.00 mg/dL   Calcium 9.2 8.9 - 10.3 mg/dL   GFR calc non Af Amer >60 >60 mL/min   GFR calc Af Amer >60 >60 mL/min   Anion gap 5 5 - 15  CBC  Result Value Ref Range   WBC 7.4 3.6 - 11.0 K/uL   RBC 4.31 3.80 - 5.20 MIL/uL   Hemoglobin 14.4 12.0 - 16.0 g/dL   HCT 42.4 35.0 - 47.0 %   MCV 98.3 80.0 - 100.0 fL   MCH 33.5 26.0 - 34.0 pg   MCHC 34.0 32.0 - 36.0 g/dL   RDW 13.8 11.5 - 14.5 %   Platelets 161 150 - 440 K/uL   Dg Chest 2 View  Result Date: 04/14/2017 CLINICAL DATA:  Increase weakness and shortness of breath with productive  cough. Increased home oxygen requirement. History of COPD, current smoker, Hodgkin's disease. EXAM: CHEST  2 VIEW COMPARISON:  Chest x-ray of Nov 15, 2015 and chest CT scan of September 02, 2016 FINDINGS: The lungs are adequately inflated. The interstitial markings are coarse though stable. There is no alveolar infiltrate or pleural effusion. The heart and pulmonary vascularity are normal. The mediastinum is normal in width. The bony thorax exhibits no acute abnormality. There is stable curvature of the mid to lower thoracic spine convex toward the left. IMPRESSION: COPD. No pneumonia, CHF, nor other acute cardiopulmonary abnormality. Electronically Signed   By: David  Martinique M.D.   On: 04/14/2017 16:56

## 2017-04-14 NOTE — ED Triage Notes (Signed)
States increased weakness and SOB, states productive cough, wears home o2 at night but states she has been using o2 during the day, denies any pain at present

## 2017-07-15 ENCOUNTER — Other Ambulatory Visit: Payer: Self-pay

## 2017-07-15 DIAGNOSIS — I10 Essential (primary) hypertension: Secondary | ICD-10-CM

## 2017-07-16 ENCOUNTER — Inpatient Hospital Stay: Payer: Medicare Other | Attending: Internal Medicine

## 2017-07-16 ENCOUNTER — Inpatient Hospital Stay (HOSPITAL_BASED_OUTPATIENT_CLINIC_OR_DEPARTMENT_OTHER): Payer: Medicare Other | Admitting: Internal Medicine

## 2017-07-16 ENCOUNTER — Encounter: Payer: Self-pay | Admitting: Internal Medicine

## 2017-07-16 VITALS — BP 116/74 | HR 66 | Temp 97.9°F | Wt 119.4 lb

## 2017-07-16 DIAGNOSIS — F17210 Nicotine dependence, cigarettes, uncomplicated: Secondary | ICD-10-CM | POA: Diagnosis not present

## 2017-07-16 DIAGNOSIS — Z9221 Personal history of antineoplastic chemotherapy: Secondary | ICD-10-CM | POA: Diagnosis not present

## 2017-07-16 DIAGNOSIS — Z923 Personal history of irradiation: Secondary | ICD-10-CM | POA: Diagnosis not present

## 2017-07-16 DIAGNOSIS — I10 Essential (primary) hypertension: Secondary | ICD-10-CM | POA: Insufficient documentation

## 2017-07-16 DIAGNOSIS — C8191 Hodgkin lymphoma, unspecified, lymph nodes of head, face, and neck: Secondary | ICD-10-CM

## 2017-07-16 DIAGNOSIS — D751 Secondary polycythemia: Secondary | ICD-10-CM | POA: Diagnosis not present

## 2017-07-16 LAB — CBC WITH DIFFERENTIAL/PLATELET
BASOS ABS: 0.1 10*3/uL (ref 0–0.1)
Basophils Relative: 1 %
EOS PCT: 3 %
Eosinophils Absolute: 0.2 10*3/uL (ref 0–0.7)
HCT: 42.4 % (ref 35.0–47.0)
Hemoglobin: 14.4 g/dL (ref 12.0–16.0)
Lymphocytes Relative: 23 %
Lymphs Abs: 1.6 10*3/uL (ref 1.0–3.6)
MCH: 33.3 pg (ref 26.0–34.0)
MCHC: 34 g/dL (ref 32.0–36.0)
MCV: 98.2 fL (ref 80.0–100.0)
Monocytes Absolute: 0.5 10*3/uL (ref 0.2–0.9)
Monocytes Relative: 7 %
Neutro Abs: 4.7 10*3/uL (ref 1.4–6.5)
Neutrophils Relative %: 66 %
PLATELETS: 161 10*3/uL (ref 150–440)
RBC: 4.32 MIL/uL (ref 3.80–5.20)
RDW: 14.2 % (ref 11.5–14.5)
WBC: 6.9 10*3/uL (ref 3.6–11.0)

## 2017-07-16 LAB — COMPREHENSIVE METABOLIC PANEL
ALT: 25 U/L (ref 14–54)
AST: 22 U/L (ref 15–41)
Albumin: 3.7 g/dL (ref 3.5–5.0)
Alkaline Phosphatase: 62 U/L (ref 38–126)
Anion gap: 9 (ref 5–15)
BUN: 20 mg/dL (ref 6–20)
CHLORIDE: 103 mmol/L (ref 101–111)
CO2: 27 mmol/L (ref 22–32)
CREATININE: 0.45 mg/dL (ref 0.44–1.00)
Calcium: 8.8 mg/dL — ABNORMAL LOW (ref 8.9–10.3)
GFR calc non Af Amer: >60 mL/min (ref >60)
Glucose, Bld: 152 mg/dL — ABNORMAL HIGH (ref 65–99)
POTASSIUM: 4.1 mmol/L (ref 3.5–5.1)
SODIUM: 139 mmol/L (ref 135–145)
Total Bilirubin: 0.6 mg/dL (ref 0.3–1.2)
Total Protein: 6.9 g/dL (ref 6.5–8.1)

## 2017-07-16 NOTE — Progress Notes (Signed)
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   Denies any unusual lumps or bumps. Denies any night sweats. Denies any fevers.  Stable weight.  Continues to have chronic tremors unchanged.  As per family patient noted to have increasing bruising on the upper extremities; as per family/patient is spontaneous.  No blood in stools or black loose stools.  No gum bleeding or nosebleeds or bleeding of the joints.  REVIEW OF SYSTEMS:  A complete 10 point review of system is done which is negative except mentioned above/history of present illness.   PAST MEDICAL HISTORY :  Past Medical History:  Diagnosis Date  . Cancer [facility])    hodgkins lymphoma dx 2011 chemo and rad tx finished in 2012  . COPD (chronic obstructive pulmonary disease) ([place])   . Degenerative disc disease, cervical   . Depression   . Diabetes mellitus ([place])   . GERD (gastroesophageal reflux disease)   . Hypertension   . Polio   . Postpolio syndrome    left leg paralysis    PAST SURGICAL HISTORY :   Past Surgical History:  Procedure Laterality Date  . ABDOMINAL HYSTERECTOMY    . BLADDER SURGERY    . BREAST LUMPECTOMY    . CARPAL TUNNEL RELEASE  2004   Bilateral  . CHOLECYSTECTOMY    . RECTAL PROLAPSE REPAIR  April 2010    FAMILY HISTORY :   Family History  Problem Relation Age of Onset  . Cancer Father   . Cancer Paternal Grandmother   . Diabetes Mother   . Emphysema Mother      SOCIAL HISTORY:   Social History   Tobacco Use  . Smoking status: Current Every Day Smoker    Packs/day: 0.50    Years: 57.00    Pack years: 28.50    Types: Cigarettes  . Smokeless tobacco: Never Used  . Tobacco comment: quit smoking 11/13/15  Substance Use Topics  . Alcohol use: No    Alcohol/week: 0.0 oz  . Drug use: No    ALLERGIES:  is allergic to trazodone.  MEDICATIONS:  Current Outpatient Medications  Medication Sig Dispense Refill  . albuterol (PROVENTIL HFA;VENTOLIN HFA) 108 (90 Base) MCG/ACT inhaler Inhale 2 puffs into the lungs every 4 (four) hours as needed for wheezing or shortness of breath.    Marland Kitchen aspirin EC 81 MG tablet Take 81 mg by mouth daily.    Marland Kitchen b complex vitamins tablet Take 1 tablet by mouth daily.    . clonazePAM (KLONOPIN) 0.5 MG tablet Take 1 tablet (0.5 mg total) by mouth 2 (two) times daily as needed for anxiety. 15 tablet 0  . divalproex (DEPAKOTE) 250 MG DR tablet Take 250 mg by mouth 2 (two) times daily.     Marland Kitchen ipratropium-albuterol (DUONEB) 0.5-2.5 (3) MG/3ML SOLN Take 3 mLs by nebulization every 4 (four) hours as needed. 360 mL   . levothyroxine (SYNTHROID, LEVOTHROID) 75 MCG tablet Take 75  mcg by mouth daily before breakfast.     . losartan (COZAAR) 100 MG tablet Take 100 mg by mouth daily.     . metoprolol tartrate (LOPRESSOR) 25 MG tablet Take 0.5 tablets (12.5 mg total) by mouth 2 (two) times daily. 60 tablet 0  . mirabegron ER (MYRBETRIQ) 50 MG TB24 tablet Take 50 mg by mouth at bedtime.     . nicotine polacrilex (COMMIT) 2 MG lozenge Take 1 lozenge (2 mg total) by mouth as needed for smoking cessation. 81 tablet 10  . PARoxetine (PAXIL) 20 MG tablet Take 20 mg by mouth daily.     . benzonatate (TESSALON PERLES) 100 MG capsule Take 1 capsule (100 mg total) by mouth every 6 (six) hours as needed for cough. (Patient not taking: Reported on 07/16/2017) 30 capsule 0  . cholecalciferol (VITAMIN D) 1000 units tablet Take 1,000 Units by mouth daily.     . Fluticasone-Salmeterol (ADVAIR DISKUS) 250-50 MCG/DOSE AEPB Inhale 1 puff into the lungs 2 (two) times daily. (Patient not taking: Reported on 07/16/2017) 60 each   . senna-docusate (SENOKOT-S) 8.6-50 MG tablet Take 2 tablets by mouth daily as needed.     No current facility-administered medications for this visit.     PHYSICAL EXAMINATION:   BP 116/74 (BP Location: Right Arm, Patient Position: Sitting)   Pulse 66   Temp 97.9 F (36.6 C) (Tympanic)   Wt 119 lb 6.4 oz (54.2 kg)   SpO2 92%   BMI 23.32 kg/m   Filed Weights   07/16/17 1451  Weight: 119 lb 6.4 oz (54.2 kg)    GENERAL: Thin built moderately nourished. Alert, no distress and comfortable.   Accompanied by her daughter. Patient has tremors of her head /upper extremity [ chronic] EYES: no pallor or icterus OROPHARYNX: no thrush or ulceration; poor dentition  NECK: supple, no masses felt LYMPH:  no palpable lymphadenopathy in the cervical, axillary or inguinal regions LUNGS: clear to auscultation and  No wheeze or crackles HEART/CVS: regular rate & rhythm and no murmurs; No lower extremity edema ABDOMEN:abdomen soft, non-tender and normal bowel sounds Musculoskeletal:no cyanosis of digits;  Patient's left leg is in a brace [ Chronic] PSYCH: alert & oriented x 3 with fluent speech NEURO: no focal motor/sensory deficits SKIN:  no rashes or significant lesions; chronic bruises noted bilateral upper extremities.  LABORATORY DATA:  I have reviewed the data as listed    Component Value Date/Time   NA 139 07/16/2017 1435   NA 129 (L) 02/13/2014 1606   K 4.1 07/16/2017 1435   K 3.9 02/13/2014 1606   CL 103 07/16/2017 1435   CL 94 (L) 02/13/2014 1606   CO2 27 07/16/2017 1435   CO2 27 02/13/2014 1606   GLUCOSE 152 (H) 07/16/2017 1435   GLUCOSE 121 (H) 02/13/2014 1606   BUN 20 07/16/2017 1435   BUN 7 02/13/2014 1606   CREATININE 0.45 07/16/2017 1435   CREATININE 0.59 (L) 06/29/2014 1042   CALCIUM 8.8 (L)  07/16/2017 1435   CALCIUM 8.3 (L) 02/13/2014 1606   PROT 6.9 07/16/2017 1435   PROT 7.3 06/29/2014 1042   ALBUMIN 3.7 07/16/2017 1435   ALBUMIN 3.4 06/29/2014 1042   AST 22 07/16/2017 1435   AST 31 06/29/2014 1042   ALT 25 07/16/2017 1435   ALT 69 (H) 06/29/2014 1042   ALKPHOS 62 07/16/2017 1435   ALKPHOS 99 06/29/2014 1042   BILITOT 0.6 07/16/2017 1435   BILITOT 0.3 06/29/2014 1042   GFRNONAA >60  07/16/2017 1435   GFRNONAA >60 06/29/2014 1042   GFRNONAA >60 02/13/2014 1606   GFRAA >60 07/16/2017 1435   GFRAA >60 06/29/2014 1042   GFRAA >60 02/13/2014 1606    No results found for: SPEP, UPEP  Lab Results  Component Value Date   WBC 6.9 07/16/2017   NEUTROABS 4.7 07/16/2017   HGB 14.4 07/16/2017   HCT 42.4 07/16/2017   MCV 98.2 07/16/2017   PLT 161 07/16/2017      Chemistry      Component Value Date/Time   NA 139 07/16/2017 1435   NA 129 (L) 02/13/2014 1606   K 4.1 07/16/2017 1435   K 3.9 02/13/2014 1606   CL 103 07/16/2017 1435   CL 94 (L) 02/13/2014 1606   CO2 27 07/16/2017 1435   CO2 27 02/13/2014 1606   BUN 20 07/16/2017 1435   BUN 7 02/13/2014 1606   CREATININE 0.45 07/16/2017 1435   CREATININE 0.59 (L) 06/29/2014 1042      Component Value Date/Time   CALCIUM 8.8 (L) 07/16/2017 1435   CALCIUM 8.3 (L) 02/13/2014 1606   ALKPHOS 62 07/16/2017 1435   ALKPHOS 99 06/29/2014 1042   AST 22 07/16/2017 1435   AST 31 06/29/2014 1042   ALT 25 07/16/2017 1435   ALT 69 (H) 06/29/2014 1042   BILITOT 0.6 07/16/2017 1435   BILITOT 0.3 06/29/2014 1042       RADIOGRAPHIC STUDIES: I have personally reviewed the radiological images as listed and agreed with the findings in the report. No results found.   ASSESSMENT & PLAN:   Hodgkin's disease of head, face, and neck ([place]) #  Hodgkin's lymphoma stage II-status post chemotherapy radiation [ finish 2011];   # Clinically no evidence of recurrence.  No further imaging recommended.  #Active smoker-Long discussion  with the patient and family regarding quitting smoking especially given previous chemotherapy.  Patient not interested in quitting.  #Easy bruising bilateral upper extremities-for the last 2-3 years.  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  Discussed regarding checking PT/PTT; wants to hold off for now.  If the bleeding is worse and she will inform us.   # 25 minutes face-to-face with the patient discussing the above plan of care; more than 50% of time spent on prognosis/ natural history; counseling and coordination.    Cammie Sickle, MD 07/17/2017 9:36 PM

## 2017-07-16 NOTE — Progress Notes (Signed)
Patient has had weight loss last weight 125 lbs, Today weight 119.4.

## 2017-07-16 NOTE — Assessment & Plan Note (Addendum)
#    Hodgkin's lymphoma stage II-status post chemotherapy radiation [ finish 2011];   # Clinically no evidence of recurrence.  No further imaging recommended.  #Active smoker-Long discussion with the patient and family regarding quitting smoking especially given previous chemotherapy.  Patient not interested in quitting.  #Easy bruising bilateral upper extremities-for the last 2-3 years.  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  Discussed regarding checking PT/PTT; wants to hold off for now.  If the bleeding is worse and she will inform us.   # 25 minutes face-to-face with the patient discussing the above plan of care; more than 50% of time spent on prognosis/ natural history; counseling and coordination.

## 2017-08-21 ENCOUNTER — Ambulatory Visit (INDEPENDENT_AMBULATORY_CARE_PROVIDER_SITE_OTHER): Payer: Medicare Other | Admitting: Pulmonary Disease

## 2017-08-21 ENCOUNTER — Encounter: Payer: Self-pay | Admitting: Pulmonary Disease

## 2017-08-21 VITALS — BP 118/68 | HR 85 | Ht 60.0 in | Wt 117.0 lb

## 2017-08-21 DIAGNOSIS — G4734 Idiopathic sleep related nonobstructive alveolar hypoventilation: Secondary | ICD-10-CM | POA: Diagnosis not present

## 2017-08-21 DIAGNOSIS — J449 Chronic obstructive pulmonary disease, unspecified: Secondary | ICD-10-CM | POA: Diagnosis not present

## 2017-08-21 DIAGNOSIS — G14 Postpolio syndrome: Secondary | ICD-10-CM | POA: Diagnosis not present

## 2017-08-21 DIAGNOSIS — J439 Emphysema, unspecified: Secondary | ICD-10-CM

## 2017-08-21 MED ORDER — FLUTICASONE-UMECLIDIN-VILANT 100-62.5-25 MCG/INH IN AEPB: 1.0000 | INHALATION_SPRAY | Freq: Every day | RESPIRATORY_TRACT | 5 refills | Status: DC

## 2017-08-21 NOTE — Patient Instructions (Signed)
We will try a change from Advair inhaler to Trelegy inhaler I have placed prescription for Trelegy to be used 1 inhalation daily Continue nebulizer as needed for increased shortness of breath, chest tightness, wheezing, cough Continue oxygen therapy with sleep We again discussed smoking cessation Follow-up in 4-6 months or sooner as needed

## 2017-08-21 NOTE — Progress Notes (Signed)
PULMONARY OFFICE FOLLOW-UP  SUMMARY: 72 y.o. F smoker with post-polio syndrome, COPD hospitalized 05/29-06/05/17 for AECOPD. Seen in consultation by Dr Mortimer Fries. Discharged home on Advair, prednisone taper, PRN nebulized bronchodilators and O2.   CXR (11/15/15): no acute disease CT chest 07/11/15: moderate emphysema    PROBLEMS: COPD, emphysema Chronic hypoxic, hypercarbic respiratory failure Smoker  Post polio syndrome - at her baseline, this is more limiting than dyspnea  INTERVAL HISTORY: No major pulmonary events. Recent norovirus infection  SUBJ: Last seen in 03/10/17. Routine re-eval. Continues to smoke 1-2 cigs/d. Pt reports no new problems. Daughter reports increased cough in past week. Pt reports no increase in DOE but, again, daughter disagrees. Remains on Advair. Denies CP, fever, purulent sputum, hemoptysis, LE edema and calf tenderness.   OBJ: Vitals:   08/21/17 1125 08/21/17 1133  BP:  118/68  Pulse:  85  SpO2:  92%  Weight: 53.1 kg (117 lb)   Height: 5' (1.524 m)   RA  Gen: coarse generalized tremor, no respiratory distress HEENT: NCAT, sclerae white Neck: No LAN, no JVD noted Lungs: Mildly diminished BS without wheezes or other adventitious sounds Cardiovascular: Reg, no M Abdomen: Soft, NT, +BS Ext: no C/C/E, LLE in brace Neuro: Coarse tremor, no focal deficits  DATA:   BMP Latest Ref Rng & Units 07/16/2017 04/14/2017 02/25/2017  Glucose 65 - 99 mg/dL 152(H) 109(H) 119(H)  BUN 6 - 20 mg/dL 20 18 22(H)  Creatinine 0.44 - 1.00 mg/dL 0.45 0.64 0.77  Sodium 135 - 145 mmol/L 139 139 141  Potassium 3.5 - 5.1 mmol/L 4.1 3.6 4.0  Chloride 101 - 111 mmol/L 103 104 107  CO2 22 - 32 mmol/L 27 30 28   Calcium 8.9 - 10.3 mg/dL 8.8(L) 9.2 8.2(L)    CBC Latest Ref Rng & Units 07/16/2017 04/14/2017 02/27/2017  WBC 3.6 - 11.0 K/uL 6.9 7.4 10.3  Hemoglobin 12.0 - 16.0 g/dL 14.4 14.4 12.2  Hematocrit 35.0 - 47.0 % 42.4 42.4 34.3(L)  Platelets 150 - 440 K/uL 161 161 101(L)    No new CXR   IMPRESSION:   1) COPD/emphysema  2) Recalcitrant smoker  3) Post-polio syndrome - this is her principal limiting problem 4) Nocturnal hypoxemia  PLAN:  We will try a change from Advair inhaler to Trelegy inhaler Continue nebulizer as needed Continue oxygen therapy with sleep We again discussed smoking cessation Follow-up in 4-6 months or sooner as needed  Merton Border, MD PCCM service Mobile [phone] Pager [phone] 08/21/2017

## 2017-08-28 ENCOUNTER — Telehealth: Payer: Self-pay | Admitting: *Deleted

## 2017-08-28 DIAGNOSIS — Z87891 Personal history of nicotine dependence: Secondary | ICD-10-CM

## 2017-08-28 DIAGNOSIS — Z122 Encounter for screening for malignant neoplasm of respiratory organs: Secondary | ICD-10-CM

## 2017-08-28 NOTE — Telephone Encounter (Signed)
Notified patient that annual lung cancer screening low dose CT scan is due currently or will be in near future. Confirmed that patient is within the age range of 55-77, and asymptomatic, (no signs or symptoms of lung cancer). Patient denies illness that would prevent curative treatment for lung cancer if found. Verified smoking history, (curent, 57.10 pack year). The shared decision making visit was done 09/02/16. Patient is agreeable for CT scan being scheduled.

## 2017-09-04 ENCOUNTER — Ambulatory Visit: Admission: RE | Admit: 2017-09-04 | Payer: Medicare Other | Source: Ambulatory Visit

## 2017-09-11 ENCOUNTER — Ambulatory Visit: Admission: RE | Admit: 2017-09-11 | Payer: Medicare Other | Source: Ambulatory Visit

## 2017-09-24 ENCOUNTER — Ambulatory Visit
Admission: RE | Admit: 2017-09-24 | Discharge: 2017-09-24 | Disposition: A | Discharge status: Home / Self Care | Payer: Medicare Other | Source: Ambulatory Visit | Attending: Oncology | Admitting: Oncology

## 2017-09-24 DIAGNOSIS — I2510 Atherosclerotic heart disease of native coronary artery without angina pectoris: Secondary | ICD-10-CM | POA: Diagnosis not present

## 2017-09-24 DIAGNOSIS — J439 Emphysema, unspecified: Secondary | ICD-10-CM | POA: Insufficient documentation

## 2017-09-24 DIAGNOSIS — Z122 Encounter for screening for malignant neoplasm of respiratory organs: Secondary | ICD-10-CM

## 2017-09-24 DIAGNOSIS — I700 Atherosclerosis of aorta: Secondary | ICD-10-CM | POA: Diagnosis not present

## 2017-09-24 DIAGNOSIS — Z87891 Personal history of nicotine dependence: Secondary | ICD-10-CM

## 2017-09-24 DIAGNOSIS — N6320 Unspecified lump in the left breast, unspecified quadrant: Secondary | ICD-10-CM | POA: Diagnosis not present

## 2017-09-29 ENCOUNTER — Telehealth: Payer: Self-pay | Admitting: *Deleted

## 2017-09-29 NOTE — Telephone Encounter (Signed)
Notified patient of LDCT lung cancer screening program results with recommendation for 12 month follow up imaging. Also notified of incidental findings noted below and is encouraged to discuss further with PCP who will receive a copy of this note and/or the CT report. Patient is encouraged to consider mammogram that is likely to be recommended by PCP. Patient reports she has not had a mammogram in several years. Patient verbalizes understanding.   IMPRESSION: 1. Lung-RADS 2, benign appearance or behavior. Continue annual screening with low-dose chest CT without contrast in 12 months. 2. Aortic Atherosclerosis (ICD10-I70.0) and Emphysema (ICD10-J43.9). 3. LAD RCA and left circumflex coronary artery calcifications. 4. **An incidental finding of potential clinical significance has been found. There is a soft tissue nodule along the inferior aspect of the left breast measuring approximately 2.6 cm. Advise correlation with current mammographic studies.**

## 2018-04-20 ENCOUNTER — Other Ambulatory Visit: Payer: Self-pay | Admitting: Internal Medicine

## 2018-04-20 DIAGNOSIS — Z1231 Encounter for screening mammogram for malignant neoplasm of breast: Secondary | ICD-10-CM

## 2018-05-20 ENCOUNTER — Ambulatory Visit
Admission: RE | Admit: 2018-05-20 | Discharge: 2018-05-20 | Disposition: A | Discharge status: Home / Self Care | Payer: Medicare Other | Source: Ambulatory Visit | Attending: Internal Medicine | Admitting: Internal Medicine

## 2018-05-20 DIAGNOSIS — Z1231 Encounter for screening mammogram for malignant neoplasm of breast: Secondary | ICD-10-CM | POA: Insufficient documentation

## 2018-05-21 ENCOUNTER — Other Ambulatory Visit: Payer: Self-pay | Admitting: Internal Medicine

## 2018-05-21 DIAGNOSIS — N6320 Unspecified lump in the left breast, unspecified quadrant: Secondary | ICD-10-CM

## 2018-05-29 ENCOUNTER — Ambulatory Visit
Admission: RE | Admit: 2018-05-29 | Discharge: 2018-05-29 | Disposition: A | Discharge status: Home / Self Care | Payer: Medicare Other | Source: Ambulatory Visit | Attending: Internal Medicine | Admitting: Internal Medicine

## 2018-05-29 DIAGNOSIS — N6320 Unspecified lump in the left breast, unspecified quadrant: Secondary | ICD-10-CM

## 2018-06-01 ENCOUNTER — Other Ambulatory Visit: Payer: Self-pay | Admitting: Pulmonary Disease

## 2018-07-03 ENCOUNTER — Other Ambulatory Visit: Payer: Self-pay | Admitting: Pulmonary Disease

## 2018-07-15 ENCOUNTER — Encounter: Payer: Self-pay | Admitting: Internal Medicine

## 2018-07-15 ENCOUNTER — Inpatient Hospital Stay (HOSPITAL_BASED_OUTPATIENT_CLINIC_OR_DEPARTMENT_OTHER): Payer: Medicare Other | Admitting: Internal Medicine

## 2018-07-15 ENCOUNTER — Inpatient Hospital Stay: Payer: Medicare Other | Attending: Internal Medicine

## 2018-07-15 VITALS — BP 127/70 | HR 60 | Temp 97.1°F | Resp 16 | Wt 123.8 lb

## 2018-07-15 DIAGNOSIS — D751 Secondary polycythemia: Secondary | ICD-10-CM | POA: Diagnosis not present

## 2018-07-15 DIAGNOSIS — R569 Unspecified convulsions: Secondary | ICD-10-CM | POA: Diagnosis not present

## 2018-07-15 DIAGNOSIS — J449 Chronic obstructive pulmonary disease, unspecified: Secondary | ICD-10-CM | POA: Insufficient documentation

## 2018-07-15 DIAGNOSIS — Z7982 Long term (current) use of aspirin: Secondary | ICD-10-CM | POA: Diagnosis not present

## 2018-07-15 DIAGNOSIS — I10 Essential (primary) hypertension: Secondary | ICD-10-CM | POA: Insufficient documentation

## 2018-07-15 DIAGNOSIS — C8191 Hodgkin lymphoma, unspecified, lymph nodes of head, face, and neck: Secondary | ICD-10-CM

## 2018-07-15 DIAGNOSIS — Z923 Personal history of irradiation: Secondary | ICD-10-CM | POA: Diagnosis not present

## 2018-07-15 DIAGNOSIS — G14 Postpolio syndrome: Secondary | ICD-10-CM | POA: Insufficient documentation

## 2018-07-15 DIAGNOSIS — F17210 Nicotine dependence, cigarettes, uncomplicated: Secondary | ICD-10-CM | POA: Diagnosis not present

## 2018-07-15 DIAGNOSIS — Z9221 Personal history of antineoplastic chemotherapy: Secondary | ICD-10-CM | POA: Insufficient documentation

## 2018-07-15 DIAGNOSIS — R233 Spontaneous ecchymoses: Secondary | ICD-10-CM | POA: Insufficient documentation

## 2018-07-15 DIAGNOSIS — E119 Type 2 diabetes mellitus without complications: Secondary | ICD-10-CM | POA: Insufficient documentation

## 2018-07-15 DIAGNOSIS — Z79899 Other long term (current) drug therapy: Secondary | ICD-10-CM | POA: Diagnosis not present

## 2018-07-15 LAB — COMPREHENSIVE METABOLIC PANEL
ALT: 19 U/L (ref 0–44)
AST: 19 U/L (ref 15–41)
Albumin: 3.9 g/dL (ref 3.5–5.0)
Alkaline Phosphatase: 72 U/L (ref 38–126)
Anion gap: 9 (ref 5–15)
BUN: 14 mg/dL (ref 8–23)
CO2: 30 mmol/L (ref 22–32)
Calcium: 9 mg/dL (ref 8.9–10.3)
Chloride: 99 mmol/L (ref 98–111)
Creatinine, Ser: 0.44 mg/dL (ref 0.44–1.00)
GFR calc non Af Amer: >60 mL/min (ref >60)
Glucose, Bld: 112 mg/dL — ABNORMAL HIGH (ref 70–99)
Potassium: 4.5 mmol/L (ref 3.5–5.1)
Sodium: 138 mmol/L (ref 135–145)
TOTAL PROTEIN: 7.3 g/dL (ref 6.5–8.1)
Total Bilirubin: 0.5 mg/dL (ref 0.3–1.2)

## 2018-07-15 LAB — CBC WITH DIFFERENTIAL/PLATELET
Abs Immature Granulocytes: 0.02 10*3/uL (ref 0.00–0.07)
BASOS ABS: 0.1 10*3/uL (ref 0.0–0.1)
Basophils Relative: 1 %
Eosinophils Absolute: 0.2 10*3/uL (ref 0.0–0.5)
Eosinophils Relative: 4 %
HCT: 44.8 % (ref 36.0–46.0)
Hemoglobin: 15.1 g/dL — ABNORMAL HIGH (ref 12.0–15.0)
Immature Granulocytes: 0 %
Lymphocytes Relative: 24 %
Lymphs Abs: 1.4 10*3/uL (ref 0.7–4.0)
MCH: 33.3 pg (ref 26.0–34.0)
MCHC: 33.7 g/dL (ref 30.0–36.0)
MCV: 98.7 fL (ref 80.0–100.0)
Monocytes Absolute: 0.5 10*3/uL (ref 0.1–1.0)
Monocytes Relative: 9 %
Neutro Abs: 3.5 10*3/uL (ref 1.7–7.7)
Neutrophils Relative %: 62 %
Platelets: 185 10*3/uL (ref 150–400)
RBC: 4.54 MIL/uL (ref 3.87–5.11)
RDW: 13.5 % (ref 11.5–15.5)
WBC: 5.7 10*3/uL (ref 4.0–10.5)
nRBC: 0 % (ref 0.0–0.2)

## 2018-07-15 LAB — PROTIME-INR
INR: 0.94
Prothrombin Time: 12.5 seconds (ref 11.4–15.2)

## 2018-07-15 LAB — APTT: APTT: 31 s (ref 24–36)

## 2018-07-15 NOTE — Progress Notes (Signed)
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  Patient denies any new lumps or bumps.  Appetite is good.  Chronic shortness of breath chronic cough.  Not any worse.No  Hemoptysis.  Continues to have chronic easy bruising.  Is not any worse.  Review of Systems  Constitutional: Negative for chills, diaphoresis, fever, malaise/fatigue and weight loss.  HENT: Negative for nosebleeds and sore throat.   Eyes: Negative for double vision.  Respiratory: Positive for cough and shortness of breath. Negative for hemoptysis, sputum production and wheezing.   Cardiovascular: Negative for chest pain, palpitations, orthopnea and leg swelling.  Gastrointestinal: Negative for abdominal pain, blood in stool, constipation, diarrhea, heartburn, melena, nausea and vomiting.  Genitourinary: Negative for dysuria, frequency and urgency.  Musculoskeletal: Negative for back pain and joint pain.  Skin: Negative.  Negative for itching and rash.  Neurological: Negative for dizziness, tingling, focal weakness, weakness and headaches.  Endo/Heme/Allergies: Bruises/bleeds easily.  Psychiatric/Behavioral: Negative for depression. The patient is not nervous/anxious and does not have insomnia.      PAST MEDICAL HISTORY :  Past Medical History:  Diagnosis Date   . Cancer [facility])    hodgkins lymphoma dx 2011 chemo and rad tx finished in 2012  . COPD (chronic obstructive pulmonary disease) ([place])   . Degenerative disc disease, cervical   . Depression   . Diabetes mellitus ([place])   . GERD (gastroesophageal reflux disease)   . Hypertension   . Polio   . Postpolio syndrome    left leg paralysis    PAST SURGICAL HISTORY :   Past Surgical History:  Procedure Laterality Date  . ABDOMINAL HYSTERECTOMY    . BLADDER SURGERY    . BREAST LUMPECTOMY    . CARPAL TUNNEL RELEASE  2004   Bilateral  . CHOLECYSTECTOMY    . RECTAL PROLAPSE REPAIR  April 2010    FAMILY HISTORY :   Family History  Problem Relation Age of Onset  . Cancer Father   . Cancer Paternal Grandmother   . Diabetes Mother   . Emphysema Mother     SOCIAL HISTORY:   Social History   Tobacco Use  . Smoking status: Current Every Day Smoker    Packs/day: 0.25    Years: 57.00    Pack years: 14.25    Types: Cigarettes  . Smokeless tobacco: Never Used  . Tobacco comment: quit smoking 11/13/15  Substance Use Topics  . Alcohol use: No    Alcohol/week: 0.0 standard drinks  . Drug use: No    ALLERGIES:  is allergic to trazodone.  MEDICATIONS:  Current Outpatient Medications  Medication Sig Dispense Refill  . albuterol (PROVENTIL HFA;VENTOLIN HFA) 108 (90 Base) MCG/ACT inhaler Inhale 2 puffs into the lungs every 4 (four) hours as needed for wheezing or shortness of breath.    Marland Kitchen  aspirin EC 81 MG tablet Take 81 mg by mouth daily.    Marland Kitchen b complex vitamins tablet Take 1 tablet by mouth daily.    . cholecalciferol (VITAMIN D) 1000 units tablet Take 1,000 Units by mouth daily.    . clonazePAM (KLONOPIN) 0.5 MG tablet Take 1 tablet (0.5 mg total) by mouth 2 (two) times daily as needed for anxiety. 15 tablet 0  . divalproex (DEPAKOTE) 250 MG DR tablet Take 250 mg by mouth 2 (two) times daily.     . Fluticasone-Salmeterol (ADVAIR DISKUS) 250-50 MCG/DOSE AEPB Inhale 1 puff into the lungs 2  (two) times daily. 60 each   . guaiFENesin (MUCINEX) 600 MG 12 hr tablet Take 600 mg by mouth 2 (two) times daily.    Marland Kitchen ipratropium-albuterol (DUONEB) 0.5-2.5 (3) MG/3ML SOLN Take 3 mLs by nebulization every 4 (four) hours as needed. 360 mL   . levothyroxine (SYNTHROID, LEVOTHROID) 75 MCG tablet Take 75 mcg by mouth daily before breakfast.     . losartan (COZAAR) 100 MG tablet Take 100 mg by mouth daily.     . metoprolol tartrate (LOPRESSOR) 25 MG tablet Take 0.5 tablets (12.5 mg total) by mouth 2 (two) times daily. 60 tablet 0  . mirabegron ER (MYRBETRIQ) 50 MG TB24 tablet Take 50 mg by mouth at bedtime.     . nicotine polacrilex (COMMIT) 2 MG lozenge Take 1 lozenge (2 mg total) by mouth as needed for smoking cessation. 81 tablet 10  . PARoxetine (PAXIL) 20 MG tablet Take 20 mg by mouth daily.     Marland Kitchen senna-docusate (SENOKOT-S) 8.6-50 MG tablet Take 2 tablets by mouth daily as needed.    . TRELEGY ELLIPTA 100-62.5-25 MCG/INH AEPB INHALE 1 PUFF INTO LUNGS DAILY 60 each 0   No current facility-administered medications for this visit.     PHYSICAL EXAMINATION:   BP 127/70 (BP Location: Left Arm, Patient Position: Sitting, Cuff Size: Normal)   Pulse 60   Temp (!) 97.1 F (36.2 C) (Tympanic)   Resp 16   Wt 123 lb 12.8 oz (56.2 kg)   BMI 24.18 kg/m   Filed Weights   07/15/18 1112  Weight: 123 lb 12.8 oz (56.2 kg)   Physical Exam  Constitutional: She is oriented to person, place, and time and well-developed, well-nourished, and in no distress.   Patient has tremors of her head /upper extremity ( chronic).  Walking independently.  Accompanied by daughter.  HENT:  Head: Normocephalic and atraumatic.  Mouth/Throat: Oropharynx is clear and moist. No oropharyngeal exudate.  Eyes: Pupils are equal, round, and reactive to light.  Neck: Normal range of motion. Neck supple.  Cardiovascular: Normal rate and regular rhythm.  Pulmonary/Chest: No respiratory distress. She has no wheezes.   Decreased air entry bilaterally.  Abdominal: Soft. Bowel sounds are normal. She exhibits no distension and no mass. There is no abdominal tenderness. There is no rebound and no guarding.  Musculoskeletal: Normal range of motion.        General: No tenderness or edema.  Neurological: She is alert and oriented to person, place, and time.  Skin: Skin is warm.  Psychiatric: Affect normal.     LABORATORY DATA:  I have reviewed the data as listed    Component Value Date/Time   NA 138 07/15/2018 1035   NA 129 (L) 02/13/2014 1606   K 4.5 07/15/2018 1035   K 3.9 02/13/2014 1606   CL 99 07/15/2018 1035   CL 94 (L) 02/13/2014 1606  CO2 30 07/15/2018 1035   CO2 27 02/13/2014 1606   GLUCOSE 112 (H) 07/15/2018 1035   GLUCOSE 121 (H) 02/13/2014 1606   BUN 14 07/15/2018 1035   BUN 7 02/13/2014 1606   CREATININE 0.44 07/15/2018 1035   CREATININE 0.59 (L) 06/29/2014 1042   CALCIUM 9.0 07/15/2018 1035   CALCIUM 8.3 (L) 02/13/2014 1606   PROT 7.3 07/15/2018 1035   PROT 7.3 06/29/2014 1042   ALBUMIN 3.9 07/15/2018 1035   ALBUMIN 3.4 06/29/2014 1042   AST 19 07/15/2018 1035   AST 31 06/29/2014 1042   ALT 19 07/15/2018 1035   ALT 69 (H) 06/29/2014 1042   ALKPHOS 72 07/15/2018 1035   ALKPHOS 99 06/29/2014 1042   BILITOT 0.5 07/15/2018 1035   BILITOT 0.3 06/29/2014 1042   GFRNONAA >60 07/15/2018 1035   GFRNONAA >60 06/29/2014 1042   GFRNONAA >60 02/13/2014 1606   GFRAA >60 07/15/2018 1035   GFRAA >60 06/29/2014 1042   GFRAA >60 02/13/2014 1606    No results found for: SPEP, UPEP  Lab Results  Component Value Date   WBC 5.7 07/15/2018   NEUTROABS 3.5 07/15/2018   HGB 15.1 (H) 07/15/2018   HCT 44.8 07/15/2018   MCV 98.7 07/15/2018   PLT 185 07/15/2018      Chemistry      Component Value Date/Time   NA 138 07/15/2018 1035   NA 129 (L) 02/13/2014 1606   K 4.5 07/15/2018 1035   K 3.9 02/13/2014 1606   CL 99 07/15/2018 1035   CL 94 (L) 02/13/2014 1606   CO2 30 07/15/2018  1035   CO2 27 02/13/2014 1606   BUN 14 07/15/2018 1035   BUN 7 02/13/2014 1606   CREATININE 0.44 07/15/2018 1035   CREATININE 0.59 (L) 06/29/2014 1042      Component Value Date/Time   CALCIUM 9.0 07/15/2018 1035   CALCIUM 8.3 (L) 02/13/2014 1606   ALKPHOS 72 07/15/2018 1035   ALKPHOS 99 06/29/2014 1042   AST 19 07/15/2018 1035   AST 31 06/29/2014 1042   ALT 19 07/15/2018 1035   ALT 69 (H) 06/29/2014 1042   BILITOT 0.5 07/15/2018 1035   BILITOT 0.3 06/29/2014 1042       RADIOGRAPHIC STUDIES: I have personally reviewed the radiological images as listed and agreed with the findings in the report. No results found.   ASSESSMENT & PLAN:   Hodgkin's disease of head, face, and neck ([place]) #  Hodgkin's lymphoma stage II-status post chemotherapy radiation [ finish 2011]; clinically no evidence of recurrence.  Stable.  Continue follow-up without imaging.  #Active smoker/discussed smoking cessation especially with bleomycin.  Not interested.  CT scan April 2019/lung cancer screening program discussed-left upper lobe nodule subcentimeter stable; new right upper lobe nodule 4 mm-awaiting repeat CT scan in April 2020.stable.   #Mild erythrocytosis hemoglobin 15.1.  Likely secondary to smoking.  Monitor for now.  No phlebotomy.  #Easy bruising bilateral upper extremities-stable PT/PPT normal.   # DISPOSITION:  # follow up in 12 months-MD/labs- cbc/cmp/ldh-Dr.B    Cammie Sickle, MD 07/15/2018 1:11 PM

## 2018-07-15 NOTE — Assessment & Plan Note (Addendum)
#    Hodgkin's lymphoma stage II-status post chemotherapy radiation [ finish 2011]; clinically no evidence of recurrence.  Stable.  Continue follow-up without imaging.  #Active smoker/discussed smoking cessation especially with bleomycin.  Not interested.  CT scan April 2019/lung cancer screening program discussed-left upper lobe nodule subcentimeter stable; new right upper lobe nodule 4 mm-awaiting repeat CT scan in April 2020.stable.   #Mild erythrocytosis hemoglobin 15.1.  Likely secondary to smoking.  Monitor for now.  No phlebotomy.  #Easy bruising bilateral upper extremities-stable PT/PPT normal.   # DISPOSITION:  # follow up in 12 months-MD/labs- cbc/cmp/ldh-Dr.B

## 2018-09-03 ENCOUNTER — Encounter: Payer: Self-pay | Admitting: *Deleted

## 2018-09-05 ENCOUNTER — Encounter: Payer: Self-pay | Admitting: *Deleted

## 2018-11-03 ENCOUNTER — Telehealth: Payer: Self-pay | Admitting: *Deleted

## 2018-11-03 NOTE — Telephone Encounter (Signed)
Left message for patient to notify them that it is time to schedule annual low dose lung cancer screening CT scan. Instructed patient to call back to verify information prior to the scan being scheduled.  

## 2018-12-04 ENCOUNTER — Telehealth: Payer: Self-pay

## 2018-12-04 ENCOUNTER — Telehealth: Payer: Self-pay | Admitting: *Deleted

## 2018-12-04 DIAGNOSIS — Z122 Encounter for screening for malignant neoplasm of respiratory organs: Secondary | ICD-10-CM

## 2018-12-04 DIAGNOSIS — Z87891 Personal history of nicotine dependence: Secondary | ICD-10-CM

## 2018-12-04 NOTE — Telephone Encounter (Signed)
Call pt regarding lung screening. PT is a current smoker, smoking about 1/2 pack per day. Pt would like any day in the afternoon. Pt denies any health issues.

## 2018-12-04 NOTE — Telephone Encounter (Signed)
Patient has been notified that annual lung cancer screening low dose CT scan is due currently or will be in near future. Confirmed that patient is within the age range of 55-77, and asymptomatic, (no signs or symptoms of lung cancer). Patient denies illness that would prevent curative treatment for lung cancer if found. Verified smoking history, (current, 57.6 pack year). The shared decision making visit was done 09/02/16. Patient is agreeable for CT scan being scheduled.

## 2018-12-09 ENCOUNTER — Ambulatory Visit: Admission: RE | Admit: 2018-12-09 | Payer: Medicare Other | Source: Ambulatory Visit

## 2019-05-25 ENCOUNTER — Encounter: Payer: Self-pay | Admitting: *Deleted

## 2019-07-15 ENCOUNTER — Telehealth: Payer: Self-pay | Admitting: *Deleted

## 2019-07-15 NOTE — Telephone Encounter (Signed)
Spoke with patient. She would like to cnl her apts in the cancer center tomorrow with Dr. Rogue Bussing. (follow-up for h/o hodgkins and errythrocytosis). She is scheduled to have a lab visit on 2/18 with Dr. Edwina Barth. She would like pcp to order the labs that Dr. B needed (cbc, metc, ldh - as orders on the chart).  She would like pcp to determine when she needs to f/u with Dr. B. She feels this is best given the current conditions with the pandemic.  Will route this note to pcp as well.

## 2019-07-16 ENCOUNTER — Inpatient Hospital Stay: Payer: Medicare Other | Admitting: Internal Medicine

## 2019-07-16 ENCOUNTER — Inpatient Hospital Stay: Payer: Medicare Other

## 2019-07-16 NOTE — Telephone Encounter (Signed)
I agree- Water quality scientist for talking to her. GB

## 2020-09-28 ENCOUNTER — Other Ambulatory Visit: Payer: Self-pay

## 2020-09-28 ENCOUNTER — Emergency Department: Payer: Medicare Other

## 2020-09-28 ENCOUNTER — Emergency Department
Admission: EM | Admit: 2020-09-28 | Discharge: 2020-09-28 | Disposition: A | Discharge status: Home / Self Care | Payer: Medicare Other | Attending: Emergency Medicine | Admitting: Emergency Medicine

## 2020-09-28 DIAGNOSIS — E039 Hypothyroidism, unspecified: Secondary | ICD-10-CM | POA: Insufficient documentation

## 2020-09-28 DIAGNOSIS — Z8571 Personal history of Hodgkin lymphoma: Secondary | ICD-10-CM | POA: Insufficient documentation

## 2020-09-28 DIAGNOSIS — F17210 Nicotine dependence, cigarettes, uncomplicated: Secondary | ICD-10-CM | POA: Insufficient documentation

## 2020-09-28 DIAGNOSIS — Z7982 Long term (current) use of aspirin: Secondary | ICD-10-CM | POA: Diagnosis not present

## 2020-09-28 DIAGNOSIS — I10 Essential (primary) hypertension: Secondary | ICD-10-CM | POA: Insufficient documentation

## 2020-09-28 DIAGNOSIS — Z79899 Other long term (current) drug therapy: Secondary | ICD-10-CM | POA: Insufficient documentation

## 2020-09-28 DIAGNOSIS — R0602 Shortness of breath: Secondary | ICD-10-CM | POA: Diagnosis present

## 2020-09-28 DIAGNOSIS — E119 Type 2 diabetes mellitus without complications: Secondary | ICD-10-CM | POA: Diagnosis not present

## 2020-09-28 DIAGNOSIS — J441 Chronic obstructive pulmonary disease with (acute) exacerbation: Secondary | ICD-10-CM | POA: Diagnosis not present

## 2020-09-28 DIAGNOSIS — R918 Other nonspecific abnormal finding of lung field: Secondary | ICD-10-CM

## 2020-09-28 LAB — CBC WITH DIFFERENTIAL/PLATELET
Abs Immature Granulocytes: 0.13 10*3/uL — ABNORMAL HIGH (ref 0.00–0.07)
Basophils Absolute: 0.1 10*3/uL (ref 0.0–0.1)
Basophils Relative: 0 %
Eosinophils Absolute: 0 10*3/uL (ref 0.0–0.5)
Eosinophils Relative: 0 %
HCT: 44.3 % (ref 36.0–46.0)
Hemoglobin: 15 g/dL (ref 12.0–15.0)
Immature Granulocytes: 1 %
Lymphocytes Relative: 5 %
Lymphs Abs: 0.9 10*3/uL (ref 0.7–4.0)
MCH: 32.5 pg (ref 26.0–34.0)
MCHC: 33.9 g/dL (ref 30.0–36.0)
MCV: 96.1 fL (ref 80.0–100.0)
Monocytes Absolute: 1.4 10*3/uL — ABNORMAL HIGH (ref 0.1–1.0)
Monocytes Relative: 8 %
Neutro Abs: 14.8 10*3/uL — ABNORMAL HIGH (ref 1.7–7.7)
Neutrophils Relative %: 86 %
Platelets: 143 10*3/uL — ABNORMAL LOW (ref 150–400)
RBC: 4.61 MIL/uL (ref 3.87–5.11)
RDW: 13.7 % (ref 11.5–15.5)
WBC: 17.3 10*3/uL — ABNORMAL HIGH (ref 4.0–10.5)
nRBC: 0 % (ref 0.0–0.2)

## 2020-09-28 LAB — BASIC METABOLIC PANEL
Anion gap: 9 (ref 5–15)
BUN: 16 mg/dL (ref 8–23)
CO2: 28 mmol/L (ref 22–32)
Calcium: 8.9 mg/dL (ref 8.9–10.3)
Chloride: 98 mmol/L (ref 98–111)
Creatinine, Ser: 0.54 mg/dL (ref 0.44–1.00)
GFR, Estimated: >60 mL/min (ref >60)
Glucose, Bld: 122 mg/dL — ABNORMAL HIGH (ref 70–99)
Potassium: 3.8 mmol/L (ref 3.5–5.1)
Sodium: 135 mmol/L (ref 135–145)

## 2020-09-28 LAB — TROPONIN I (HIGH SENSITIVITY): Troponin I (High Sensitivity): 8 ng/L (ref <18)

## 2020-09-28 LAB — BRAIN NATRIURETIC PEPTIDE: B Natriuretic Peptide: 102.7 pg/mL — ABNORMAL HIGH (ref 0.0–100.0)

## 2020-09-28 MED ORDER — IPRATROPIUM-ALBUTEROL 0.5-2.5 (3) MG/3ML IN SOLN
3.0000 mL | Freq: Once | RESPIRATORY_TRACT | Status: AC
  Administered 2020-09-28: 3 mL via RESPIRATORY_TRACT
  Filled 2020-09-28: qty 3

## 2020-09-28 MED ORDER — PREDNISONE 20 MG PO TABS
60.0000 mg | ORAL_TABLET | Freq: Once | ORAL | Status: AC
  Administered 2020-09-28: 60 mg via ORAL
  Filled 2020-09-28: qty 3

## 2020-09-28 MED ORDER — IOHEXOL 350 MG/ML SOLN
75.0000 mL | Freq: Once | INTRAVENOUS | Status: AC | PRN
  Administered 2020-09-28: 75 mL via INTRAVENOUS

## 2020-09-28 MED ORDER — PREDNISONE 20 MG PO TABS: 60.0000 mg | ORAL_TABLET | Freq: Every day | ORAL | 0 refills | Status: DC

## 2020-09-28 NOTE — ED Provider Notes (Signed)
[facility] Emergency Department Provider Note   ____________________________________________   Event Date/Time   First MD Initiated Contact with Patient 09/28/20 1530     (approximate)  I have reviewed the triage vital signs and the nursing notes.   HISTORY  Chief Complaint Shortness of Breath    HPI [name] is a 75 y.o. female with past medical history of hypertension, diabetes, COPD, remote Hodgkin's lymphoma, and post polio syndrome with left leg weakness who presents to the ED complaining of shortness of breath.  Patient reports that she has developed a cough productive of white frothy sputum over the past 2 days.  She is feeling increasingly short of breath but denies any fevers or pain in her chest.  She has not noticed any pain or swelling in her legs.  She used her albuterol inhaler just prior to arrival with partial relief.  EMS was called and found patient to have O2 sats in the mid 80s on arrival.  Patient typically wears oxygen at night and with exertion, was placed on 3 L nasal cannula by EMS.        Past Medical History:  Diagnosis Date  . Cancer [facility])    hodgkins lymphoma dx 2011 chemo and rad tx finished in 2012  . COPD (chronic obstructive pulmonary disease) ([place])   . Degenerative disc disease, cervical   . Depression   . Diabetes mellitus ([place])   . GERD (gastroesophageal reflux disease)   . Hypertension   . Polio   . Postpolio syndrome    left leg paralysis    Patient Active Problem List   Diagnosis Date Noted  . 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 Personal history of tobacco use, presenting hazards to health 09/02/2016  . Malnutrition of moderate degree 11/16/2015  . COPD exacerbation ([place]) 11/13/2015  . Right ankle pain 02/01/2015  . Hypothyroidism due to acquired atrophy of thyroid 01/17/2015  . Microalbuminuria 01/17/2015  . Hodgkin's disease of head, face, and neck ([place]) 12/13/2014  . Tremor  07/04/2014  . Difficulty in walking 02/15/2014  . Fatigue 02/15/2014  . Seizures ([place]) 02/07/2014  . Sleep disorder 02/07/2014  . 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 Diabetes mellitus type 2, controlled ([place]) 12/06/2013  . Esophageal reflux 12/06/2013  . Benign essential hypertension 12/06/2013  . History of lymphoma 12/06/2013  . Hypercholesterolemia 12/06/2013  . Post-polio syndrome 12/06/2013    Past Surgical History:  Procedure Laterality Date  . ABDOMINAL HYSTERECTOMY    . BLADDER SURGERY    . BREAST LUMPECTOMY    . CARPAL TUNNEL RELEASE  2004   Bilateral  . CHOLECYSTECTOMY    . RECTAL PROLAPSE REPAIR  April 2010    Prior to Admission medications   Medication Sig Start Date End Date Taking? Authorizing Provider  predniSONE (DELTASONE) 20 MG tablet Take 3 tablets (60 mg total) by mouth daily with breakfast for 5 days. 09/28/20 10/03/20 Yes Blake Divine, MD  albuterol (PROVENTIL HFA;VENTOLIN HFA) 108 (90 Base) MCG/ACT inhaler Inhale 2 puffs into the lungs every 4 (four) hours as needed for wheezing or shortness of breath.    [provider]  aspirin EC 81 MG tablet Take 81 mg by mouth daily.    [provider]  b complex vitamins tablet Take 1 tablet by mouth daily.    [provider]  cholecalciferol (VITAMIN D) 1000 units tablet Take 1,000 Units by mouth daily.    [provider]  clonazePAM (KLONOPIN) 0.5 MG tablet Take 1 tablet (0.5 mg total) by mouth 2 (two) times daily as needed for anxiety. 11/20/15   Hillary Bow, MD  divalproex (DEPAKOTE) 250 MG DR tablet Take 250 mg by mouth 2 (two) times daily.     [provider]  Fluticasone-Salmeterol (ADVAIR DISKUS) 250-50 MCG/DOSE AEPB Inhale 1 puff into the lungs 2 (two) times daily. 11/20/15   Hillary Bow, MD  guaiFENesin (MUCINEX) 600 MG 12 hr tablet Take 600 mg by mouth 2 (two) times daily.    [provider]  ipratropium-albuterol  (DUONEB) 0.5-2.5 (3) MG/3ML SOLN Take 3 mLs by nebulization every 4 (four) hours as needed. 11/20/15   Hillary Bow, MD  levothyroxine (SYNTHROID, LEVOTHROID) 75 MCG tablet Take 75 mcg by mouth daily before breakfast.  07/10/16   [provider]  losartan (COZAAR) 100 MG tablet Take 100 mg by mouth daily.     [provider]  metoprolol tartrate (LOPRESSOR) 25 MG tablet Take 0.5 tablets (12.5 mg total) by mouth 2 (two) times daily. 11/20/15   Hillary Bow, MD  mirabegron ER (MYRBETRIQ) 50 MG TB24 tablet Take 50 mg by mouth at bedtime.     [provider]  nicotine polacrilex (COMMIT) 2 MG lozenge Take 1 lozenge (2 mg total) by mouth as needed for smoking cessation. 03/10/17   Wilhelmina Mcardle, MD  PARoxetine (PAXIL) 20 MG tablet Take 20 mg by mouth daily.  07/05/16   [provider]  senna-docusate (SENOKOT-S) 8.6-50 MG tablet Take 2 tablets by mouth daily as needed.    [provider]  Donnal Debar 100-62.5-25 MCG/INH AEPB INHALE 1 PUFF INTO LUNGS DAILY 07/03/18   Wilhelmina Mcardle, MD    Allergies Trazodone  Family History  Problem Relation Age of Onset  . Cancer Father   . Cancer Paternal Grandmother   . Diabetes Mother   . Emphysema Mother     Social History Social History   Tobacco Use  . Smoking status: Current Every Day Smoker    Packs/day: 0.25    Years: 57.00    Pack years: 14.25    Types: Cigarettes  . Smokeless tobacco: Never Used  . Tobacco comment: quit smoking 11/13/15  Substance Use Topics  . Alcohol use: No    Alcohol/week: 0.0 standard drinks  . Drug use: No    Review of Systems  Constitutional: No fever/chills Eyes: No visual changes. ENT: No sore throat. Cardiovascular: Denies chest pain. Respiratory: Positive for cough and shortness of breath. Gastrointestinal: No abdominal pain.  No nausea, no vomiting.  No diarrhea.  No constipation. Genitourinary: Negative for dysuria. Musculoskeletal: Negative for back  pain. Skin: Negative for rash. Neurological: Negative for headaches, focal weakness or numbness.  ____________________________________________   PHYSICAL EXAM:  VITAL SIGNS: ED Triage Vitals  Enc Vitals Group     BP      Pulse      Resp      Temp      Temp src      SpO2      Weight      Height      Head Circumference      Peak Flow      Pain Score      Pain Loc      Pain Edu?      Excl. in [place]?     Constitutional: Alert and oriented. Eyes: Conjunctivae are normal. Head: Atraumatic. Nose: No congestion/rhinnorhea. Mouth/Throat: Mucous membranes  are moist. Neck: Normal ROM Cardiovascular: Normal rate, regular rhythm. Grossly normal heart sounds. Respiratory: Normal respiratory effort.  No retractions. Lungs with expiratory wheezing. Gastrointestinal: Soft and nontender. No distention. Genitourinary: deferred Musculoskeletal: No lower extremity tenderness nor edema. Neurologic:  Normal speech and language. No gross focal neurologic deficits are appreciated.  Essential tremor. Skin:  Skin is warm, dry and intact. No rash noted. Psychiatric: Mood and affect are normal. Speech and behavior are normal.  ____________________________________________   LABS (all labs ordered are listed, but only abnormal results are displayed)  Labs Reviewed  CBC WITH DIFFERENTIAL/PLATELET - Abnormal; Notable for the following components:      Result Value   WBC 17.3 (*)    Platelets 143 (*)    Neutro Abs 14.8 (*)    Monocytes Absolute 1.4 (*)    Abs Immature Granulocytes 0.13 (*)    All other components within normal limits  BASIC METABOLIC PANEL - Abnormal; Notable for the following components:   Glucose, Bld 122 (*)    All other components within normal limits  BRAIN NATRIURETIC PEPTIDE - Abnormal; Notable for the following components:   B Natriuretic Peptide 102.7 (*)    All other components within normal limits  TROPONIN I (HIGH SENSITIVITY)    ____________________________________________   PROCEDURES  Procedure(s) performed (including Critical Care):  Procedures  ED ECG REPORT I, Blake Divine, the attending physician, personally viewed and interpreted this ECG.   Date: 09/28/2020  EKG Time: 19:11  Rate: 93  Rhythm: normal sinus rhythm  Axis: Normal  Intervals:none  ST&T Change: Nonspecific ST/T wave changes, similar to previous  ____________________________________________   INITIAL IMPRESSION / ASSESSMENT AND PLAN / ED COURSE       75 year old female with past medical history of hypertension, diabetes, COPD, remote Hodgkin's lymphoma, and post polio syndrome with left leg weakness who presents to the ED complaining of increasing difficulty breathing with productive cough over the past 2 days.  She has some expiratory wheezing on exam but is not in any respiratory distress, maintaining O2 sats of greater than 88% on room air.  She denies any chest pain and EKG shows no evidence of arrhythmia or ischemia, low suspicion for ACS.  We will check labs and chest x-ray, treat with DuoNeb for now.  Low suspicion for PE at this time.  Chest x-ray reviewed by me and shows no infiltrate or edema but does show suspicious left apical mass.  This was further assessed with CT scan which showed no evidence of PE or pneumonia, but does redemonstrate mass concerning for neoplasm.  Case discussed with Dr. Grayland Ormond of oncology, will be available to follow-up with patient as an outpatient in 5 days.  Patient states her breathing feels much better after DuoNeb and steroids, suspect shortness of breath is due to COPD exacerbation.  She reports having albuterol available at home and she is appropriate for discharge home on course of steroids.  Daughter agrees with plan and was counseled to have patient return to the ED for any new or worsening symptoms.      ____________________________________________   FINAL CLINICAL IMPRESSION(S) /  ED DIAGNOSES  Final diagnoses:  COPD exacerbation ([place])  Pulmonary mass     ED Discharge Orders         Ordered    predniSONE (DELTASONE) 20 MG tablet  Daily with breakfast        09/28/20 1907           Note:  This document  was prepared using Systems analyst and may include unintentional dictation errors.   Blake Divine, MD 09/28/20 [phone]

## 2020-09-28 NOTE — ED Notes (Signed)
Patient transported to X-ray 

## 2020-09-28 NOTE — ED Triage Notes (Signed)
Pt BIBA from home for SOB, increased x couple days. Productive cough. hx copd, htn, polio. 73% room air with ems. 2L [place] at night. 95% on 3L [place] upon arrival to ED.

## 2020-10-03 ENCOUNTER — Other Ambulatory Visit: Payer: Self-pay

## 2020-10-03 ENCOUNTER — Encounter: Payer: Self-pay | Admitting: Internal Medicine

## 2020-10-03 ENCOUNTER — Inpatient Hospital Stay: Payer: Medicare Other | Attending: Internal Medicine | Admitting: Internal Medicine

## 2020-10-03 DIAGNOSIS — Z9221 Personal history of antineoplastic chemotherapy: Secondary | ICD-10-CM | POA: Insufficient documentation

## 2020-10-03 DIAGNOSIS — G14 Postpolio syndrome: Secondary | ICD-10-CM | POA: Diagnosis not present

## 2020-10-03 DIAGNOSIS — C8191 Hodgkin lymphoma, unspecified, lymph nodes of head, face, and neck: Secondary | ICD-10-CM | POA: Insufficient documentation

## 2020-10-03 DIAGNOSIS — F17210 Nicotine dependence, cigarettes, uncomplicated: Secondary | ICD-10-CM | POA: Diagnosis not present

## 2020-10-03 DIAGNOSIS — R569 Unspecified convulsions: Secondary | ICD-10-CM | POA: Diagnosis not present

## 2020-10-03 DIAGNOSIS — Z9981 Dependence on supplemental oxygen: Secondary | ICD-10-CM | POA: Insufficient documentation

## 2020-10-03 DIAGNOSIS — R0602 Shortness of breath: Secondary | ICD-10-CM | POA: Insufficient documentation

## 2020-10-03 DIAGNOSIS — R251 Tremor, unspecified: Secondary | ICD-10-CM | POA: Diagnosis not present

## 2020-10-03 DIAGNOSIS — R911 Solitary pulmonary nodule: Secondary | ICD-10-CM | POA: Diagnosis not present

## 2020-10-03 DIAGNOSIS — R053 Chronic cough: Secondary | ICD-10-CM | POA: Insufficient documentation

## 2020-10-03 DIAGNOSIS — Z923 Personal history of irradiation: Secondary | ICD-10-CM | POA: Insufficient documentation

## 2020-10-03 DIAGNOSIS — J449 Chronic obstructive pulmonary disease, unspecified: Secondary | ICD-10-CM | POA: Diagnosis not present

## 2020-10-03 NOTE — Progress Notes (Signed)
[place] NOTE  Patient Care Team: Baxter Hire, MD as PCP - General (Internal Medicine)  CHIEF COMPLAINTS/PURPOSE OF CONSULTATION: Lung   #  Oncology History Overview Note  # NOV 2011- HODGKIN'S' LYMPHOMA- STAGE II s/p ABVD x4; s/p IFRT [finished June 2012]  # April 2022-CT scan chest ~3cm left upper lobe apex lung nodule; borderline mediastinal apathy; right posterior medial infiltrate  # SECONDARY ERYTHROCYTOSIS from smoking  # active smoker/LCSP [april2019]  # Post polio Left LE paralysis; Hx of seizures/tremors  DIAGNOSIS  STAGE:        ;GOALS:   CURRENT/MOST RECENT THERAPY:     Hodgkin's disease of head, face, and neck (HCC)     HISTORY OF PRESENTING ILLNESS:  [name] 75 y.o.  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  Patient was last seen in 2020, Jan-for Hodgkin's lymphoma follow-up; but then lost to follow-up.  Patient's was recently evaluated in the emergency room for COPD exacerbation-where a CT scan chest showed no PE but approximately 3 cm left lung nodule in the apex; but borderline mediastinal lymph nodes.   Patient continues have chronic shortness of breath chronic cough.  No hemoptysis.  No headaches.  Patient has significant tremors-of the head/extremities-which has progressive getting worse.  Patient goes around with a walker.  Review of Systems  Constitutional: Positive for malaise/fatigue and weight loss. Negative for chills, diaphoresis and fever.  HENT: Negative for nosebleeds and sore throat.   Eyes: Negative for double vision.  Respiratory: Positive for cough and shortness of breath. Negative for sputum production and wheezing.   Cardiovascular: Negative for chest pain, palpitations, orthopnea and leg swelling.   Gastrointestinal: Negative for abdominal pain, blood in stool, constipation, diarrhea, heartburn, melena, nausea and vomiting.  Genitourinary: Negative for dysuria, frequency and urgency.  Musculoskeletal: Negative for back pain and joint pain.  Skin: Negative.  Negative for itching and rash.  Neurological: Positive for dizziness and tremors. Negative for tingling, focal weakness, weakness and headaches.  Endo/Heme/Allergies: Does not bruise/bleed easily.  Psychiatric/Behavioral: Negative for depression. The patient is not nervous/anxious and does not have insomnia.      MEDICAL HISTORY:  Past Medical History:  Diagnosis Date  . Cancer [facility])    hodgkins lymphoma dx 2011 chemo and rad tx finished in 2012  . COPD (chronic obstructive pulmonary disease) ([place])   . Degenerative disc disease, cervical   . Depression   . Diabetes mellitus ([place])   . GERD (gastroesophageal reflux disease)   . Hypertension   . Polio   . Postpolio syndrome    left leg paralysis    SURGICAL HISTORY: Past Surgical History:  Procedure Laterality Date  . ABDOMINAL HYSTERECTOMY    . BLADDER SURGERY    . BREAST LUMPECTOMY    . CARPAL TUNNEL RELEASE  2004   Bilateral  . CHOLECYSTECTOMY    . RECTAL PROLAPSE REPAIR  April 2010    SOCIAL HISTORY: Social History   Socioeconomic History  . Marital status: Widowed    Spouse name: Not on file  . Number of children: Not on file  . Years of education: Not on file  . Highest education level: Not on file  Occupational History  . Not on file  Tobacco Use  . Smoking status: Current Every Day Smoker    Packs/day:  0.25    Years: 57.00    Pack years: 14.25    Types: Cigarettes  . Smokeless tobacco: Never Used  . Tobacco comment: quit smoking 11/13/15  Substance and Sexual Activity  . Alcohol use: No    Alcohol/week: 0.0 standard drinks  . Drug use: No  . Sexual activity: Not on file  Other Topics Concern  . Not on file  Social History Narrative  . Not  on file   Social Determinants of Health   Financial Resource Strain: Not on file  Food Insecurity: Not on file  Transportation Needs: Not on file  Physical Activity: Not on file  Stress: Not on file  Social Connections: Not on file  Intimate Partner Violence: Not on file    FAMILY HISTORY: Family History  Problem Relation Age of Onset  . Cancer Father   . Cancer Paternal Grandmother   . Diabetes Mother   . Emphysema Mother     ALLERGIES:  is allergic to trazodone.  MEDICATIONS:  Current Outpatient Medications  Medication Sig Dispense Refill  . albuterol (PROVENTIL HFA;VENTOLIN HFA) 108 (90 Base) MCG/ACT inhaler Inhale 2 puffs into the lungs every 4 (four) hours as needed for wheezing or shortness of breath.    Marland Kitchen aspirin EC 81 MG tablet Take 81 mg by mouth daily.    Marland Kitchen b complex vitamins tablet Take 1 tablet by mouth daily.    . cholecalciferol (VITAMIN D) 1000 units tablet Take 1,000 Units by mouth daily.    . clonazePAM (KLONOPIN) 0.5 MG tablet Take 1 tablet (0.5 mg total) by mouth 2 (two) times daily as needed for anxiety. 15 tablet 0  . divalproex (DEPAKOTE) 250 MG DR tablet Take 250 mg by mouth 2 (two) times daily.     . Fluticasone-Salmeterol (ADVAIR DISKUS) 250-50 MCG/DOSE AEPB Inhale 1 puff into the lungs 2 (two) times daily. 60 each   . guaiFENesin (MUCINEX) 600 MG 12 hr tablet Take 600 mg by mouth 2 (two) times daily.    Marland Kitchen ipratropium-albuterol (DUONEB) 0.5-2.5 (3) MG/3ML SOLN Take 3 mLs by nebulization every 4 (four) hours as needed. 360 mL   . levothyroxine (SYNTHROID, LEVOTHROID) 75 MCG tablet Take 75 mcg by mouth daily before breakfast.     . losartan (COZAAR) 100 MG tablet Take 100 mg by mouth daily.     . metoprolol tartrate (LOPRESSOR) 25 MG tablet Take 0.5 tablets (12.5 mg total) by mouth 2 (two) times daily. 60 tablet 0  . mirabegron ER (MYRBETRIQ) 50 MG TB24 tablet Take 50 mg by mouth at bedtime.     Marland Kitchen PARoxetine (PAXIL) 20 MG tablet Take 20 mg by mouth  daily.     Marland Kitchen senna-docusate (SENOKOT-S) 8.6-50 MG tablet Take 2 tablets by mouth daily as needed.    . TRELEGY ELLIPTA 100-62.5-25 MCG/INH AEPB INHALE 1 PUFF INTO LUNGS DAILY 60 each 0  . nicotine polacrilex (COMMIT) 2 MG lozenge Take 1 lozenge (2 mg total) by mouth as needed for smoking cessation. (Patient not taking: Reported on 10/03/2020) 81 tablet 10   No current facility-administered medications for this visit.      Marland Kitchen  PHYSICAL EXAMINATION: ECOG PERFORMANCE STATUS: 3 - Symptomatic, >50% confined to bed  Vitals:   10/03/20 1524  BP: (!) 156/70  Pulse: (!) 53  Resp: 20  Temp: (!) 96.4 F (35.8 C)  SpO2: 98%   Filed Weights   10/03/20 1524  Weight: 110 lb (49.9 kg)    Physical Exam Constitutional:  Comments: [name] Caucasian female patient she is in a wheelchair.  Accompanied by her daughter.  HENT:     Head: Normocephalic and atraumatic.     Mouth/Throat:     Pharynx: No oropharyngeal exudate.  Eyes:     Pupils: Pupils are equal, round, and reactive to light.  Cardiovascular:     Rate and Rhythm: Normal rate and regular rhythm.  Pulmonary:     Effort: No respiratory distress.     Breath sounds: No wheezing.     Comments: Decreased air entry bilaterally. Abdominal:     General: Bowel sounds are normal. There is no distension.     Palpations: Abdomen is soft. There is no mass.     Tenderness: There is no abdominal tenderness. There is no guarding or rebound.  Musculoskeletal:        General: No tenderness. Normal range of motion.     Cervical back: Normal range of motion and neck supple.  Skin:    General: Skin is warm.  Neurological:     Mental Status: She is alert and oriented to person, place, and time.     Comments: Tremors of the head upper extremities.;  Chronic weakness of the left lower extremity (post polio paralysis)  Psychiatric:        Mood and Affect: Affect normal.      LABORATORY DATA:  I have reviewed the data as listed Lab  Results  Component Value Date   WBC 17.3 (H) 09/28/2020   HGB 15.0 09/28/2020   HCT 44.3 09/28/2020   MCV 96.1 09/28/2020   PLT 143 (L) 09/28/2020   Recent Labs    09/28/20 1539  NA 135  K 3.8  CL 98  CO2 28  GLUCOSE 122*  BUN 16  CREATININE 0.54  CALCIUM 8.9  GFRNONAA >60    RADIOGRAPHIC STUDIES: I have personally reviewed the radiological images as listed and agreed with the findings in the report. DG Chest 2 View  Result Date: 09/28/2020 CLINICAL DATA:  Shortness of breath, increased the past couple days. COPD. History of lymphoma 2011. Smoker. EXAM: CHEST - 2 VIEW COMPARISON:  Chest x-ray 04/14/2017, CT chest 09/24/2017 FINDINGS: The heart size and mediastinal contours are unchanged. Atherosclerotic plaque. Emphysematous changes. Interval development of a 3.5 cm left apical density. Biapical pleural/pulmonary scarring. No pulmonary edema. Trace left pleural effusion. No pneumothorax. No acute osseous abnormality. IMPRESSION: 1. Interval development of a 3.5 cm left apical density. Recommend CT chest for further evaluation. 2. Trace left pleural effusion. 3.  Aortic Atherosclerosis (ICD10-I70.0). Electronically Signed   By: Iven Finn M.D.   On: 09/28/2020 16:25   CT Angio Chest PE W/Cm &/Or Wo Cm  Result Date: 09/28/2020 CLINICAL DATA:  Shortness of breath, cough and hypoxia. History of COPD and smoking. EXAM: CT ANGIOGRAPHY CHEST WITH CONTRAST TECHNIQUE: Multidetector CT imaging of the chest was performed using the standard protocol during bolus administration of intravenous contrast. Multiplanar CT image reconstructions and MIPs were obtained to evaluate the vascular anatomy. CONTRAST:  49mL OMNIPAQUE IOHEXOL 350 MG/ML SOLN COMPARISON:  Low-dose screening chest CT on 09/24/2017 FINDINGS: Cardiovascular: Pulmonary arteries are well opacified. There is no evidence of pulmonary embolism. Central pulmonary arteries are normal in caliber. The thoracic aorta demonstrates  atherosclerosis without evidence of aneurysmal disease. Proximal great vessels demonstrate heavily calcified plaque at the origin of the left subclavian artery which is likely causing significant stenosis of greater than 70%. Other visualized proximal great vessels demonstrate no significant obstructive  disease. The heart size is normal. No pericardial fluid identified. Calcified coronary artery plaque present. Mediastinum/Nodes: Lower right paratracheal lymph node measures up to 11 mm in short axis. AP window node measures up to 10 mm. No visualized enlarged axillary, hilar or supraclavicular lymph nodes. Lungs/Pleura: There is a new mass-like density at the left lung apex which was not present on the screening CT in 2019. Elongated mass extends up to the subpleural surface of the left apex and measures up to 3.1 cm in height and roughly 1.9 x 3 cm in transverse dimensions. This is suspicious for pulmonary neoplasm. New area of ground-glass opacity in the posterior left lower lobe extends over a region of approximately 9 x 14 mm. Ground-glass opacity in the medial aspect of the right lower lobe abuts the thoracic spine in an area measuring up to roughly 2 cm in greatest diameter. Underlying emphysema again present. No pulmonary edema, pleural fluid or pneumothorax identified. Upper Abdomen: No acute abnormality. Musculoskeletal: No chest wall abnormality. No acute or significant osseous findings. Review of the MIP images confirms the above findings. IMPRESSION: 1. No evidence of pulmonary embolism. 2. New mass-like density at the left lung apex extending up to the subpleural surface of the left apex and measuring up to 3.1 cm in greatest diameter. This is suspicious for pulmonary neoplasm. Recommend evaluation with PET scan and multidisciplinary pulmonary/thoracic oncology referral. 3. New area of ground-glass opacity in the posterior left lower lobe. Ground-glass opacity in the medial aspect of the right lower  lobe abuts the thoracic spine. These are nonspecific and may be inflammatory or neoplastic in etiology. 4. Borderline sized mediastinal lymph nodes. 5. Coronary artery disease. 6. 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 7. Emphysema and aortic atherosclerosis. Aortic Atherosclerosis (ICD10-I70.0) and Emphysema (ICD10-J43.9). Electronically Signed   By: Aletta Edouard M.D.   On: 09/28/2020 17:18    ASSESSMENT & PLAN:   Nodule of upper lobe of left lung #Left upper lobe lung nodule-3.1 cm left apex-highly concerning for malignancy; along with borderline increase in size of the mediastinal lymph nodes.  Also noted to haveNew area of ground-glass opacity in the posterior left lower lobe. Ground-glass opacity in the medial aspect of the right lower lobe abuts the thoracic spine.  Recommend further evaluation with a PET scan ASAP  #Had a long discussion with the patient and the daughter regarding the concerns for malignancy [clinically most suggestive of primary lung; less likely recurrent Hodgkin's].  However discussed the importance of biopsy.  However given patient's frail pulmonary status/advanced COPD I suspect patient will be at high risk for biopsy.  However await above PET scan  #Discussed with the daughter/patient that given patient's comorbidities patient is unlikely candidate for any surgical resection.  Patient more likely candidate for radiation.  # Hodgkin's lymphoma stage II-status post chemotherapy radiation [ finish 2011]; clinically less likely recurrent Hodgkin's.  However await above work-up.  # Active smoker/discussed smoking cessation especially with bleomycin-patient not interested quitting smoking.  #Advanced COPD- on O2 [place]-clinical stable however patient at high risk for decompensation.  Continue to monitor closely.  # DISPOSITION: # PET ASAP # follow up- MD- virtual; 1-2 post PET  scan-Dr.B  # I reviewed the blood work- with the patient in detail; also reviewed the imaging independently [as summarized above]; and with the patient in detail.      All questions were answered. The patient knows to call  the clinic with any problems, questions or concerns.    Cammie Sickle, MD 10/04/2020 8:40 AM

## 2020-10-03 NOTE — Assessment & Plan Note (Addendum)
#  Left upper lobe lung nodule-3.1 cm left apex-highly concerning for malignancy; along with borderline increase in size of the mediastinal lymph nodes.  Also noted to haveNew area of ground-glass opacity in the posterior left lower lobe. Ground-glass opacity in the medial aspect of the right lower lobe abuts the thoracic spine.  Recommend further evaluation with a PET scan ASAP  #Had a long discussion with the patient and the daughter regarding the concerns for malignancy [clinically most suggestive of primary lung; less likely recurrent Hodgkin's].  However discussed the importance of biopsy.  However given patient's frail pulmonary status/advanced COPD I suspect patient will be at high risk for biopsy.  However await above PET scan  #Discussed with the daughter/patient that given patient's comorbidities patient is unlikely candidate for any surgical resection.  Patient more likely candidate for radiation.  # Hodgkin's lymphoma stage II-status post chemotherapy radiation [ finish 2011]; clinically less likely recurrent Hodgkin's.  However await above work-up.  # Active smoker/discussed smoking cessation especially with bleomycin-patient not interested quitting smoking.  #Advanced COPD- on O2 [place]-clinical stable however patient at high risk for decompensation.  Continue to monitor closely.  # DISPOSITION: # PET ASAP # follow up- MD- virtual; 1-2 post PET scan-Dr.B  # I reviewed the blood work- with the patient in detail; also reviewed the imaging independently [as summarized above]; and with the patient in detail.

## 2020-10-12 ENCOUNTER — Other Ambulatory Visit: Payer: Self-pay

## 2020-10-12 ENCOUNTER — Encounter
Admission: RE | Admit: 2020-10-12 | Discharge: 2020-10-12 | Disposition: A | Discharge status: Home / Self Care | Payer: Medicare Other | Source: Ambulatory Visit | Attending: Internal Medicine | Admitting: Internal Medicine

## 2020-10-12 DIAGNOSIS — I700 Atherosclerosis of aorta: Secondary | ICD-10-CM | POA: Diagnosis not present

## 2020-10-12 DIAGNOSIS — I2510 Atherosclerotic heart disease of native coronary artery without angina pectoris: Secondary | ICD-10-CM | POA: Insufficient documentation

## 2020-10-12 DIAGNOSIS — R911 Solitary pulmonary nodule: Secondary | ICD-10-CM | POA: Diagnosis not present

## 2020-10-12 DIAGNOSIS — J9811 Atelectasis: Secondary | ICD-10-CM | POA: Diagnosis not present

## 2020-10-12 DIAGNOSIS — J439 Emphysema, unspecified: Secondary | ICD-10-CM | POA: Insufficient documentation

## 2020-10-12 LAB — GLUCOSE, CAPILLARY: Glucose-Capillary: 82 mg/dL (ref 70–99)

## 2020-10-12 MED ORDER — FLUDEOXYGLUCOSE F - 18 (FDG) INJECTION
5.7000 | Freq: Once | INTRAVENOUS | Status: AC | PRN
  Administered 2020-10-12: 6.15 via INTRAVENOUS

## 2020-10-13 ENCOUNTER — Inpatient Hospital Stay (HOSPITAL_BASED_OUTPATIENT_CLINIC_OR_DEPARTMENT_OTHER): Payer: Medicare Other | Admitting: Internal Medicine

## 2020-10-13 DIAGNOSIS — R911 Solitary pulmonary nodule: Secondary | ICD-10-CM

## 2020-10-13 NOTE — Assessment & Plan Note (Addendum)
#  Left upper lobe lung nodule-3.1 cm left apex-highly concerning for malignancy; along with borderline increase in size of the mediastinal lymph nodes. April 28th, 2022- PET scan-left upper lobe nodule suggestive of malignancy; equivocal right paratracheal lymph nodes.  #Discussed regarding biopsy-however I am concerned about tolerating the biopsy given her fragile respiratory status.  Will discuss at tumor conference.  I discussed option of radiation/SBRT; patient a poor candidate for any systemic chemotherapy/concurrent chemoradiation.  Recommend evaluation with Dr. Donella Stade.   # Clustered areas of tree-in-bud nodularity and subsegmental atelectasis noted within the lingula and anterior basal right upperLobe-likely infectious-on antibiotics as prednisone.  # Hodgkin's lymphoma stage II-status post chemotherapy radiation [ finish 2011]; clinically less likely recurrent Hodgkin's.   # Active smoker/discussed smoking cessation especially with bleomycin-patient not interested quitting smoking.  #Advanced COPD- on O2 [place]-clinical stable however patient at high risk for decompensation.  Continue to monitor closely.  # DISPOSITION: # referral to Dr.Chrystal re: lung cancer- week of May 9th # home palliative care referral # follow up TBD-Dr.B  Cc; [place]

## 2020-10-13 NOTE — Progress Notes (Signed)
I connected with [name] on 10/13/2020 at  3:15 PM EDT by video enabled telemedicine visit and verified that I am speaking with the correct person using two identifiers.  I discussed the limitations, risks, security and privacy concerns of performing an evaluation and management service by telemedicine and the availability of in-person appointments. I also discussed with the patient that there may be a patient responsible charge related to this service. The patient expressed understanding and agreed to proceed.    Other persons participating in the visit and their role in the encounter: RN/medical reconciliation Patient's location: home Provider's location: office  Oncology History Overview Note  # NOV 2011- HODGKIN'S' LYMPHOMA- STAGE II s/p ABVD x4; s/p IFRT [finished June 2012]  # April 2022-CT scan chest ~3cm left upper lobe apex lung nodule; borderline mediastinal apathy; right posterior medial infiltrate  # SECONDARY ERYTHROCYTOSIS from smoking  # active smoker/LCSP [april2019]  # Post polio Left LE paralysis; Hx of seizures/tremors  DIAGNOSIS  STAGE:        ;GOALS:   CURRENT/MOST RECENT THERAPY:     Hodgkin's disease of head, face, and neck ([place])     Chief Complaint: Lung nodule/cancer   History of present illness:[name] 75 y.o.  female with history of Hodgkin's disease more than 10 years ago; and the incidental new lung nodule is here today with results of the PET scan.  In the interim patient diagnosed with bronchitis.  Patient has been treated with prednisone/and also steroids.  Patient seems to be feeling slightly better.  However continues to have overall difficulty breathing.  Chronic cough.  She continues to be on O2 nasal cannula.  Observation/objective: Alert & oriented x 3. In No acute distress.   Assessment and plan: Nodule of upper lobe of left lung #Left upper lobe lung nodule-3.1 cm left apex-highly concerning for malignancy; along with  borderline increase in size of the mediastinal lymph nodes. April 28th, 2022- PET scan-left upper lobe nodule suggestive of malignancy; equivocal right paratracheal lymph nodes.  #Discussed regarding biopsy-however I am concerned about tolerating the biopsy given her fragile respiratory status.  Will discuss at tumor conference.  I discussed option of radiation/SBRT; patient a poor candidate for any systemic chemotherapy/concurrent chemoradiation.  Recommend evaluation with Dr. Donella Stade.   # Clustered areas of tree-in-bud nodularity and subsegmental atelectasis noted within the lingula and anterior basal right upperLobe-likely infectious-on antibiotics as prednisone.  # Hodgkin's lymphoma stage II-status post chemotherapy radiation [ finish 2011]; clinically less likely recurrent Hodgkin's.   # Active smoker/discussed smoking cessation especially with bleomycin-patient not interested quitting smoking.  #Advanced COPD- on O2 [place]-clinical stable however patient at high risk for decompensation.  Continue to monitor closely.  # DISPOSITION: # referral to Dr.Chrystal re: lung cancer- week of May 9th # home palliative care referral # follow up TBD-Dr.B  Cc; [place]    Follow-up instructions:  I discussed the assessment and treatment plan with the patient.  The patient was provided an opportunity to ask questions and all were answered.  The patient agreed with the plan and demonstrated understanding of instructions.  The patient was advised to call back or seek an in person evaluation if the symptoms worsen or if the condition fails to improve as anticipated.   Dr. Charlaine Dalton Gillham at [facility] 10/19/2020 4:15 PM

## 2020-10-13 NOTE — Progress Notes (Signed)
Per caregiver, patient is being tx for a rattling cough. pcp prescribed levaquin and tesalon pearles, predisone. Per daughter, "pt is smoking while having her oxygen tank on (3 liters of oxygen). Daughter has asked pt to stop doing this, but patient refuses to stop smoking. States that pt said "I'm probably going to die anyways and what does it matter if she blows her self up."  Daughter would like to discuss the role of hospice vs palliative care vs options for radiation. She does not believe her mom will tolerate chemotherapy given the weak state. She stated that the patient stays in the chair most of the time and sleeps and doesn't want to move around. Pt has Has lost the will to live.

## 2020-10-19 ENCOUNTER — Telehealth: Payer: Self-pay | Admitting: Internal Medicine

## 2020-10-19 ENCOUNTER — Other Ambulatory Visit: Payer: Self-pay | Admitting: Internal Medicine

## 2020-10-19 NOTE — Progress Notes (Signed)
mdt on 5/12.

## 2020-10-19 NOTE — Telephone Encounter (Signed)
On 5/05-I spoke to patient/daughter [name] regarding the treatment options including radiation versus the supportive care.  Patient [name] is interested in clinic visit to discuss further.  C- Schedule on 5/10- 8:45- MD- no labs.  Also- Josh re: palliative care evaluation  Referral to Dr.Chrystal re: Lung cancer [if possible coordinate with above appointments]  GB

## 2020-10-20 ENCOUNTER — Other Ambulatory Visit: Payer: Self-pay | Admitting: *Deleted

## 2020-10-20 ENCOUNTER — Telehealth: Payer: Self-pay | Admitting: Primary Care

## 2020-10-20 DIAGNOSIS — R911 Solitary pulmonary nodule: Secondary | ICD-10-CM

## 2020-10-20 NOTE — Telephone Encounter (Signed)
Spoke with patient's daughter, Rudene Anda, regarding the Palliative referral/services and all questions were answered and she was in agreement with scheduling visit. I have scheduled an In-home Consult for 11/02/20 @ 3 PM

## 2020-10-20 NOTE — Telephone Encounter (Signed)
Home palliative care referral entered per v/o Dr. Rogue Bussing. Apts for Dr. Donella Stade arranged.

## 2020-10-26 ENCOUNTER — Ambulatory Visit
Admission: RE | Admit: 2020-10-26 | Discharge: 2020-10-26 | Disposition: A | Discharge status: Home / Self Care | Payer: Medicare Other | Source: Ambulatory Visit | Attending: Radiation Oncology | Admitting: Radiation Oncology

## 2020-10-26 ENCOUNTER — Encounter: Payer: Self-pay | Admitting: Radiation Oncology

## 2020-10-26 ENCOUNTER — Other Ambulatory Visit: Payer: Medicare Other

## 2020-10-26 ENCOUNTER — Other Ambulatory Visit: Payer: Self-pay

## 2020-10-26 VITALS — BP 158/81 | HR 57 | Temp 97.7°F | Wt 110.0 lb

## 2020-10-26 DIAGNOSIS — C3412 Malignant neoplasm of upper lobe, left bronchus or lung: Secondary | ICD-10-CM

## 2020-10-26 NOTE — Progress Notes (Signed)
Tumor Board Documentation  [name] was presented by Verlon Au, RN at our Tumor Board on 10/26/2020, which included representatives from medical oncology,radiation oncology,internal medicine,navigation,pathology,radiology,surgical,pulmonology,research,palliative care.  [name] currently presents as a current patient,for discussion with history of the following treatments: active survellience.  Additionally, we reviewed previous medical and familial history, history of present illness, and recent lab results along with all available histopathologic and imaging studies. The tumor board considered available treatment options and made the following recommendations: Radiation therapy (primary modality) (SBRT)    The following procedures/referrals were also placed: No orders of the defined types were placed in this encounter.   Clinical Trial Status: not discussed   Staging used: Clinical Stage  AJCC Staging:       Group: Stage I Lung Cancer   National site-specific guidelines NCCN were discussed with respect to the case.  Tumor board is a meeting of clinicians from various specialty areas who evaluate and discuss patients for whom a multidisciplinary approach is being considered. Final determinations in the plan of care are those of the provider(s). The responsibility for follow up of recommendations given during tumor board is that of the provider.   Today's extended care, comprehensive team conference, [name] was not present for the discussion and was not examined.   Multidisciplinary Tumor Board is a multidisciplinary case peer review process.  Decisions discussed in the Multidisciplinary Tumor Board reflect the opinions of the specialists present at the conference without having examined the patient.  Ultimately, treatment and diagnostic decisions rest with the primary provider(s) and the patient.

## 2020-10-26 NOTE — Consult Note (Signed)
NEW PATIENT EVALUATION  Name: [name]  MRN: [id number]  Date:   10/26/2020     DOB: 1946/05/27   This 75 y.o. female patient presents to the clinic for initial evaluation of left upper lobe presumed stage I non-small cell lung cancer.  REFERRING PHYSICIAN: Baxter Hire, MD  CHIEF COMPLAINT: No chief complaint on file.   DIAGNOSIS: There were no encounter diagnoses.   PREVIOUS INVESTIGATIONS:  CT scans and PET CT scans reviewed Clinical notes reviewed Labs reviewed Case presented at weekly tumor conference  HPI: Patient is a 75 year old female with history of Hodgkin's lymphoma stage II treated back in 2012.  In April 2022 she presented with a new finding of a 3 cm left upper lobe apical nodule.  There was some borderline mediastinal adenopathy.  Patient is status post polio with left lower extremity paralysis.  She continues to smoke is on current continuous nasal oxygen.  CT scan of her chest demonstrated the left upper lobe nodule suspicious for pulmonary neoplasm.  She underwent a PET CT scan which showed intense hypermetabolic activity in the left upper lobe lung nodule concerning for primary bronchogenic carcinoma.  There was borderline enlarged right paratracheal lymph node with mild uptake equivocal for nodal metastasis.  She had some other areas of groundglass attenuation in the medial right lower lobe most likely postinflammatory or infectious.  She was presented at tumor conference with the opinion of the group being that this was a stage I non-small cell lung cancer.  Based on her overall general condition biopsy was not thought possible.  She is seen today for radiation oncology opinion regarding SBRT.  She has a mild nonproductive cough on continuous nasal oxygen.  No hemoptysis or chest tightness.  PLANNED TREATMENT REGIMEN: SBRT  PAST MEDICAL HISTORY:  has a past medical history of Cancer ([place]), COPD (chronic obstructive pulmonary disease) ([place]), Degenerative disc  disease, cervical, Depression, Diabetes mellitus ([place]), GERD (gastroesophageal reflux disease), Hypertension, Polio, and Postpolio syndrome.    PAST SURGICAL HISTORY:  Past Surgical History:  Procedure Laterality Date  . ABDOMINAL HYSTERECTOMY    . BLADDER SURGERY    . BREAST LUMPECTOMY    . CARPAL TUNNEL RELEASE  2004   Bilateral  . CHOLECYSTECTOMY    . RECTAL PROLAPSE REPAIR  April 2010    FAMILY HISTORY: family history includes Cancer in her father and paternal grandmother; Diabetes in her mother; Emphysema in her mother.  SOCIAL HISTORY:  reports that she has been smoking cigarettes. She has a 14.25 pack-year smoking history. She has never used smokeless tobacco. She reports that she does not drink alcohol and does not use drugs.  ALLERGIES: Trazodone  MEDICATIONS:  Current Outpatient Medications  Medication Sig Dispense Refill  . albuterol (PROVENTIL HFA;VENTOLIN HFA) 108 (90 Base) MCG/ACT inhaler Inhale 2 puffs into the lungs every 4 (four) hours as needed for wheezing or shortness of breath.    Marland Kitchen aspirin EC 81 MG tablet Take 81 mg by mouth daily.    Marland Kitchen b complex vitamins tablet Take 1 tablet by mouth daily.    . benzonatate (TESSALON) 200 MG capsule Take 1 capsule by mouth in the morning, at noon, and at bedtime. cough    . cholecalciferol (VITAMIN D) 1000 units tablet Take 1,000 Units by mouth daily.    . clonazePAM (KLONOPIN) 0.5 MG tablet Take 1 tablet (0.5 mg total) by mouth 2 (two) times daily as needed for anxiety. 15 tablet 0  . divalproex (DEPAKOTE) 250  MG DR tablet Take 250 mg by mouth 2 (two) times daily.     . Fluticasone-Salmeterol (ADVAIR DISKUS) 250-50 MCG/DOSE AEPB Inhale 1 puff into the lungs 2 (two) times daily. 60 each   . guaiFENesin (MUCINEX) 600 MG 12 hr tablet Take 600 mg by mouth 2 (two) times daily.    Marland Kitchen ipratropium-albuterol (DUONEB) 0.5-2.5 (3) MG/3ML SOLN Take 3 mLs by nebulization every 4 (four) hours as needed. 360 mL   . levothyroxine (SYNTHROID,  LEVOTHROID) 75 MCG tablet Take 75 mcg by mouth daily before breakfast.     . losartan (COZAAR) 100 MG tablet Take 100 mg by mouth daily.     . metoprolol tartrate (LOPRESSOR) 25 MG tablet Take 0.5 tablets (12.5 mg total) by mouth 2 (two) times daily. 60 tablet 0  . mirabegron ER (MYRBETRIQ) 50 MG TB24 tablet Take 50 mg by mouth at bedtime.     . nicotine polacrilex (COMMIT) 2 MG lozenge Take 1 lozenge (2 mg total) by mouth as needed for smoking cessation. 81 tablet 10  . OXYGEN Inhale 3 L into the lungs continuous.    Marland Kitchen PARoxetine (PAXIL) 20 MG tablet Take 20 mg by mouth daily.     . predniSONE (DELTASONE) 20 MG tablet 3qam for 5 days, 2qam for 5 days, 1qam for 5 days    . senna-docusate (SENOKOT-S) 8.6-50 MG tablet Take 2 tablets by mouth daily as needed.    . TRELEGY ELLIPTA 100-62.5-25 MCG/INH AEPB INHALE 1 PUFF INTO LUNGS DAILY 60 each 0   No current facility-administered medications for this encounter.    ECOG PERFORMANCE STATUS:  0 - Asymptomatic  REVIEW OF SYSTEMS: Patient does have history of Hodgkin's disease as well as history of polio Cram chronic nasal oxygen continues to smoke Patient denies any weight loss, fatigue, weakness, fever, chills or night sweats. Patient denies any loss of vision, blurred vision. Patient denies any ringing  of the ears or hearing loss. No irregular heartbeat. Patient denies heart murmur or history of fainting. Patient denies any chest pain or pain radiating to her upper extremities. Patient denies any shortness of breath, difficulty breathing at night, cough or hemoptysis. Patient denies any swelling in the lower legs. Patient denies any nausea vomiting, vomiting of blood, or coffee ground material in the vomitus. Patient denies any stomach pain. Patient states has had normal bowel movements no significant constipation or diarrhea. Patient denies any dysuria, hematuria or significant nocturia. Patient denies any problems walking, swelling in the joints or  loss of balance. Patient denies any skin changes, loss of hair or loss of weight. Patient denies any excessive worrying or anxiety or significant depression. Patient denies any problems with insomnia. Patient denies excessive thirst, polyuria, polydipsia. Patient denies any swollen glands, patient denies easy bruising or easy bleeding. Patient denies any recent infections, allergies or URI. Patient "s visual fields have not changed significantly in recent time.   PHYSICAL EXAM: BP (!) 158/81   Pulse (!) 57   Temp 97.7 F (36.5 C) (Tympanic)   Wt 110 lb (49.9 kg)   SpO2 100% Comment: on 3 liters 02  BMI 21.48 kg/m  [place] female wheelchair-bound on continuous nasal oxygen.  Well-developed well-nourished patient in NAD. HEENT reveals PERLA, EOMI, discs not visualized.  Oral cavity is clear. No oral mucosal lesions are identified. Neck is clear without evidence of cervical or supraclavicular adenopathy. Lungs are clear to A&P. Cardiac examination is essentially unremarkable with regular rate and rhythm without murmur rub or  thrill. Abdomen is benign with no organomegaly or masses noted. Motor sensory and DTR levels are equal and symmetric in the upper and lower extremities. Cranial nerves II through XII are grossly intact. Proprioception is intact. No peripheral adenopathy or edema is identified. No motor or sensory levels are noted. Crude visual fields are within normal range.  LABORATORY DATA: Laboratory reports reviewed    RADIOLOGY RESULTS: CT scans and PET CT scans reviewed compatible with above-stated findings   IMPRESSION: Stage I non-small cell lung cancer left upper lobe in 75 year old female with multiple medical comorbidities.  PLAN: At this time elected ahead with SBRT to her left upper lobe.  Would plan on delivering 60 Gray in 5 fractions.  We will use motion restriction as well as 4-dimensional treatment planning at the time of simulation.  Risks and benefits of treatment  including possibly fatigue possibly distortion of normal lung development of cough all were discussed in detail with the patient.  She seems to comprehend my treatment plan well.  I have personally set up and ordered CT simulation next week.  Patient will think about my recommendations and decide on her treatment.  I would like to take this opportunity to thank you for allowing me to participate in the care of your patient.Noreene Filbert, MD

## 2020-10-27 ENCOUNTER — Telehealth: Payer: Self-pay | Admitting: Pharmacy Technician

## 2020-10-27 NOTE — Telephone Encounter (Signed)
Patients daughter called stating that she needs to reschedule 11/02/20 2:00pm appt because the patient already has an appointment at 3:00 somewhere else.

## 2020-11-02 ENCOUNTER — Other Ambulatory Visit: Payer: Medicare Other | Admitting: Primary Care

## 2020-11-02 ENCOUNTER — Ambulatory Visit
Admission: RE | Admit: 2020-11-02 | Discharge: 2020-11-02 | Disposition: A | Discharge status: Home / Self Care | Payer: Medicare Other | Source: Ambulatory Visit | Attending: Radiation Oncology | Admitting: Radiation Oncology

## 2020-11-02 ENCOUNTER — Other Ambulatory Visit: Payer: Self-pay

## 2020-11-02 DIAGNOSIS — C3412 Malignant neoplasm of upper lobe, left bronchus or lung: Secondary | ICD-10-CM | POA: Diagnosis not present

## 2020-11-02 DIAGNOSIS — Z515 Encounter for palliative care: Secondary | ICD-10-CM

## 2020-11-02 DIAGNOSIS — R911 Solitary pulmonary nodule: Secondary | ICD-10-CM

## 2020-11-02 DIAGNOSIS — E440 Moderate protein-calorie malnutrition: Secondary | ICD-10-CM

## 2020-11-02 NOTE — Progress Notes (Addendum)
[facility] Consult Note Telephone: [phone]  Fax: [phone]    Date of encounter: 11/02/20 PATIENT NAME: [name] [address] [place] [address]   [phone] (home)  DOB: Nov 04, 1945 MRN: [id number] PRIMARY CARE PROVIDER:    Baxter Hire, MD,  [place] [address] [phone]  REFERRING PROVIDER:   Cammie Sickle, MD [place],  [place] [address] [phone]  RESPONSIBLE PARTY:    Contact Information    Name Relation Home Work Mobile   Duncan,[name] Daughter [phone]     Sutton,Lisa D Daughter          I met face to face with patient and family in  home/facility. Palliative Care was asked to follow this patient by consultation request of Cammie Sickle, * to address advance care planning and complex medical decision making. This is the initial visit.                                     ASSESSMENT AND PLAN / RECOMMENDATIONS:   Advance Care Planning/Goals of Care: Goals include to maximize quality of life and symptom management. Our advance care planning conversation included a discussion about:     The value and importance of advance care planning   Exploration of personal, cultural or spiritual beliefs that might influence medical decisions   Exploration of goals of care in the event of a sudden injury or illness   Identification  of a healthcare agent - None  Review  of an  advance directive document .- discussed most, wants to consider. Will revisit on next meeting.  CODE STATUS: FULL  Symptom Management/Plan:  Caregiver Strain (self) : Endorse need for ADLs assistance, esp house chore work. I have given her resources of [email or web address], [facility] [facility], [place] [employer] and fee for service agencies. She voices frustration with state of her home not being kept up as she likes. She declines needing personal care assistance at  this time.   Nutrition: Reports poor po intake, 25% of previous. Has lost weight from 140 to 110 lbs in 6-8 months (21%). Declines MOW. States she enjoys food her daughters don't cook such as 'country food".  She enjoyed a meal out last pm for her birthday. She has nutritional supplements which she does take. Discussed possible mirtazapine.  Dyspnea: Feels slightly increased SOB at times, needs portable concentrator for going out. I advised her to ask insurance company if they will reimburse an [employer] device. Not using Oxygen during interview and does not exhibit difficulty breathing with talking at rest.   Follow up Palliative Care Visit: Palliative care will continue to follow for complex medical decision making, advance care planning, and clarification of goals. Return 5 weeks or prn.  I spent 60 minutes providing this consultation. More than 50% of the time in this consultation was spent in counseling and care coordination.  PPS: 50%  HOSPICE ELIGIBILITY/DIAGNOSIS: yes with concordant goals of care/abnormal weight loss  Chief Complaint: fatigue  HISTORY OF PRESENT ILLNESS:  JOLETTA Dudley is a 75 y.o. year old female  with h/o lung nodule, abnormal weight loss, fatigue and remote h/o Polio. Has h/o COPD with current tobacco use.  Beginning 5 rounds of radiation in a few weeks for lung tumor.   History obtained from review of EMR, discussion with primary team, and interview with family, facility  staff/caregiver and/or Ms. Mcclenney.  I reviewed available labs, medications, imaging, studies and related documents from the EMR.  Records reviewed and summarized above.   ROS General: NAD ENMT: endorses dysphagia Cardiovascular: denies chest pain, denies increased DOE Pulmonary: endorses cough, endorses slight  increased SOB Abdomen: endorses poor appetite, endorses occ  constipation, endorses continence of bowel GU: denies dysuria, endorses continence of urine MSK:  Endorses mild  weakness,  No  recent falls reported Skin: denies rashes or wounds Neurological: endorses back  Pain, no insomnia with clonazepam Psych: Endorses depressed at times  mood Heme/lymph/immuno: denies bruises, abnormal bleeding  Physical Exam: Current and past weights: 30 pounds loss in 9 months, 110 lbs. Constitutional: NAD General: frail appearing, thin EYES: anicteric sclera, lids intact, no discharge  ENMT: intact hearing, oral mucous membranes moist, dentition intact CV: S1S2, RRR, no LE edema Pulmonary: LCTA, no increased work of breathing, + cough, oxygen 3 L  Abdomen: intake 25%,  soft and non tender, no ascites GU: deferred MSK: severe sarcopenia, moves all extremities, ambulatory, L leg brace from Polio Skin: warm and dry, no rashes or wounds on visible skin Neuro:  ++generalized weakness,  No cognitive impairment, + tremors Psych: slight anxious affect, A and O x 3 Hem/lymph/immuno: no widespread bruising   CURRENT PROBLEM LIST:  Patient Active Problem List   Diagnosis Date Noted  . Nodule of upper lobe of left lung 10/03/2020  . 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 Personal history of tobacco use, presenting hazards to health 09/02/2016  . Malnutrition of moderate degree 11/16/2015  . COPD exacerbation ([place]) 11/13/2015  . Right ankle pain 02/01/2015  . Hypothyroidism due to acquired atrophy of thyroid 01/17/2015  . Microalbuminuria 01/17/2015  . Hodgkin's disease of head, face, and neck ([place]) 12/13/2014  . Tremor 07/04/2014  . Difficulty in walking 02/15/2014  . Fatigue 02/15/2014  . Seizures ([place]) 02/07/2014  . Sleep disorder 02/07/2014  . Chronic obstructive airway disease with asthma ([place]) 12/06/2013  . Diabetes mellitus type 2, controlled ([place]) 12/06/2013  . Esophageal reflux 12/06/2013  . Benign essential hypertension 12/06/2013  . History of lymphoma 12/06/2013  . Hypercholesterolemia 12/06/2013  . Post-polio syndrome 12/06/2013   PAST MEDICAL HISTORY:   Active Ambulatory Problems    Diagnosis Date Noted  . Hodgkin's disease of head, face, and neck ([place]) 12/13/2014  . COPD exacerbation ([place]) 11/13/2015  . Malnutrition of moderate degree 11/16/2015  . Personal history of tobacco use, presenting hazards to health 09/02/2016  . Chronic obstructive airway disease with asthma ([place]) 12/06/2013  . Diabetes mellitus type 2, controlled ([place]) 12/06/2013  . Difficulty in walking 02/15/2014  . Esophageal reflux 12/06/2013  . Benign essential hypertension 12/06/2013  . Fatigue 02/15/2014  . History of lymphoma 12/06/2013  . Hypercholesterolemia 12/06/2013  . Hypothyroidism due to acquired atrophy of thyroid 01/17/2015  . Microalbuminuria 01/17/2015  . Post-polio syndrome 12/06/2013  . Right ankle pain 02/01/2015  . Seizures ([place]) 02/07/2014  . Sleep disorder 02/07/2014  . Tremor 07/04/2014  . 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 Nodule of upper lobe of left lung 10/03/2020   Resolved Ambulatory Problems    Diagnosis Date Noted  . No Resolved Ambulatory Problems   Past Medical History:  Diagnosis Date  . Cancer ([place])   . COPD (chronic obstructive pulmonary disease) ([place])   . Degenerative disc disease, cervical   . Depression   . Diabetes mellitus ([place])   . GERD (gastroesophageal reflux disease)   .  Hypertension   . Polio   . Postpolio syndrome    SOCIAL HX:  Social History   Tobacco Use  . Smoking status: Current Every Day Smoker    Packs/day: 0.25    Years: 57.00    Pack years: 14.25    Types: Cigarettes  . Smokeless tobacco: Never Used  . Tobacco comment: quit smoking 11/13/15  Substance Use Topics  . Alcohol use: No    Alcohol/week: 0.0 standard drinks   FAMILY HX:  Family History  Problem Relation Age of Onset  . Cancer Father   . Cancer Paternal Grandmother   . Diabetes Mother   . Emphysema Mother       ALLERGIES:  Allergies  Allergen Reactions  . Trazodone Rash     PERTINENT MEDICATIONS:   Outpatient Encounter Medications as of 11/02/2020  Medication Sig  . albuterol (PROVENTIL HFA;VENTOLIN HFA) 108 (90 Base) MCG/ACT inhaler Inhale 2 puffs into the lungs every 4 (four) hours as needed for wheezing or shortness of breath.  Marland Kitchen aspirin EC 81 MG tablet Take 81 mg by mouth daily.  Marland Kitchen b complex vitamins tablet Take 1 tablet by mouth daily.  . benzonatate (TESSALON) 200 MG capsule Take 1 capsule by mouth in the morning, at noon, and at bedtime. cough  . cholecalciferol (VITAMIN D) 1000 units tablet Take 1,000 Units by mouth daily.  . clonazePAM (KLONOPIN) 0.5 MG tablet Take 1 tablet (0.5 mg total) by mouth 2 (two) times daily as needed for anxiety.  . divalproex (DEPAKOTE) 250 MG DR tablet Take 250 mg by mouth 2 (two) times daily.   . Fluticasone-Salmeterol (ADVAIR DISKUS) 250-50 MCG/DOSE AEPB Inhale 1 puff into the lungs 2 (two) times daily.  Marland Kitchen guaiFENesin (MUCINEX) 600 MG 12 hr tablet Take 600 mg by mouth 2 (two) times daily.  Marland Kitchen ipratropium-albuterol (DUONEB) 0.5-2.5 (3) MG/3ML SOLN Take 3 mLs by nebulization every 4 (four) hours as needed.  Marland Kitchen levothyroxine (SYNTHROID, LEVOTHROID) 75 MCG tablet Take 75 mcg by mouth daily before breakfast.   . losartan (COZAAR) 100 MG tablet Take 100 mg by mouth daily.   . metoprolol tartrate (LOPRESSOR) 25 MG tablet Take 0.5 tablets (12.5 mg total) by mouth 2 (two) times daily.  . mirabegron ER (MYRBETRIQ) 50 MG TB24 tablet Take 50 mg by mouth at bedtime.   . nicotine polacrilex (COMMIT) 2 MG lozenge Take 1 lozenge (2 mg total) by mouth as needed for smoking cessation.  . OXYGEN Inhale 3 L into the lungs continuous.  Marland Kitchen PARoxetine (PAXIL) 20 MG tablet Take 20 mg by mouth daily.   . predniSONE (DELTASONE) 20 MG tablet 3qam for 5 days, 2qam for 5 days, 1qam for 5 days  . senna-docusate (SENOKOT-S) 8.6-50 MG tablet Take 2 tablets by mouth daily as needed.  . TRELEGY ELLIPTA 100-62.5-25 MCG/INH AEPB INHALE 1 PUFF INTO LUNGS DAILY   No facility-administered  encounter medications on file as of 11/02/2020.    Thank you for the opportunity to participate in the care of Ms. Kanouse.  The palliative care team will continue to follow. Please call our office at [phone] if we can be of additional assistance.   Jason Coop, NP , DNP, MPH, AGPCNP-BC, ACHPN  COVID-19 PATIENT SCREENING TOOL Asked and negative response unless otherwise noted:   Have you had symptoms of covid, tested positive or been in contact with someone with symptoms/positive test in the past 5-10 days?

## 2020-11-03 ENCOUNTER — Ambulatory Visit: Payer: Medicare Other

## 2020-11-03 NOTE — Progress Notes (Signed)
Nutrition Assessment   Reason for Assessment:  Patient identified on Malnutrition screening report for weight loss   ASSESSMENT:  75 year old female with stage 1 non small cell lung cancer.  Past medical history of polio, COPD, DM, HTN, lymphoma (2012).  Planning SBRT.   Called and spoke with daughter, [name].  Daughter reports poor appetite with only eating 6-7 bites of food. "She eats like a bird."  Daughter lives with patient and helps with meal preparation.  Daughter reports that mother has said foods does not taste right or not cooked right.  For the last several months has not put on brace on legs due to polio and has gotten weak from sitting around. Daughter reports that after visit from NP with Palliative Care patient has been putting brace on and getting up more today.  Has tried ensure and drinks one if given to her.     Medications: reviewed   Labs: reviewed   Anthropometrics:   Height: 60 inches Weight: 110 lb on 5/12 140 lb 6-8 months ago per Palliative NP note BMI: 21  21% weight loss in the last 6-8 months, signficant   Estimated Energy Needs  Kcals: 1500-1750 Protein: 75-88 g Fluid: 1.5 L   NUTRITION DIAGNOSIS: Inadequate oral intake related to cancer as evidenced by 21% weight loss in the last 6-8 months and poor po intake   INTERVENTION:  Discussed ways to add calories and protein.  Will mail handout. Consider appetite stimulant Encouraged 350+ calories oral nutrition supplement or higher Encouraged small frequent meals/snack Contact information will be mailed   MONITORING, EVALUATION, GOAL: weight trends, intake   Next Visit: phone call in ~ 4 weeks  [name] B. Zenia Resides, [place], [place] Registered Dietitian [phone] (mobile)

## 2020-11-06 DIAGNOSIS — C3412 Malignant neoplasm of upper lobe, left bronchus or lung: Secondary | ICD-10-CM | POA: Diagnosis not present

## 2020-11-15 ENCOUNTER — Ambulatory Visit
Admission: RE | Admit: 2020-11-15 | Discharge: 2020-11-15 | Disposition: A | Discharge status: Home / Self Care | Payer: Medicare Other | Source: Ambulatory Visit | Attending: Radiation Oncology | Admitting: Radiation Oncology

## 2020-11-15 DIAGNOSIS — C3412 Malignant neoplasm of upper lobe, left bronchus or lung: Secondary | ICD-10-CM | POA: Insufficient documentation

## 2020-11-17 ENCOUNTER — Ambulatory Visit
Admission: RE | Admit: 2020-11-17 | Discharge: 2020-11-17 | Disposition: A | Discharge status: Home / Self Care | Payer: Medicare Other | Source: Ambulatory Visit | Attending: Radiation Oncology | Admitting: Radiation Oncology

## 2020-11-17 DIAGNOSIS — C3412 Malignant neoplasm of upper lobe, left bronchus or lung: Secondary | ICD-10-CM | POA: Diagnosis not present

## 2020-11-20 ENCOUNTER — Ambulatory Visit
Admission: RE | Admit: 2020-11-20 | Discharge: 2020-11-20 | Disposition: A | Discharge status: Home / Self Care | Payer: Medicare Other | Source: Ambulatory Visit | Attending: Radiation Oncology | Admitting: Radiation Oncology

## 2020-11-20 DIAGNOSIS — C3412 Malignant neoplasm of upper lobe, left bronchus or lung: Secondary | ICD-10-CM | POA: Diagnosis not present

## 2020-11-22 ENCOUNTER — Ambulatory Visit
Admission: RE | Admit: 2020-11-22 | Discharge: 2020-11-22 | Disposition: A | Discharge status: Home / Self Care | Payer: Medicare Other | Source: Ambulatory Visit | Attending: Radiation Oncology | Admitting: Radiation Oncology

## 2020-11-22 ENCOUNTER — Ambulatory Visit: Payer: Medicare Other

## 2020-11-22 DIAGNOSIS — C3412 Malignant neoplasm of upper lobe, left bronchus or lung: Secondary | ICD-10-CM | POA: Diagnosis not present

## 2020-11-24 ENCOUNTER — Ambulatory Visit: Payer: Medicare Other

## 2020-11-24 ENCOUNTER — Telehealth: Payer: Self-pay

## 2020-11-24 NOTE — Telephone Encounter (Signed)
Nutrition  Called patient's daughter times 2 this afternoon for nutrition follow-up.  Unable to reach daughter and no option to leave voicemail. Daughter has been mailed RD contact information and available as needed  Katharin Schneider B. Zenia Resides, [place], [place] Registered Dietitian [phone] (mobile)

## 2020-11-27 ENCOUNTER — Ambulatory Visit
Admission: RE | Admit: 2020-11-27 | Discharge: 2020-11-27 | Disposition: A | Discharge status: Home / Self Care | Payer: Medicare Other | Source: Ambulatory Visit | Attending: Radiation Oncology | Admitting: Radiation Oncology

## 2020-11-27 ENCOUNTER — Other Ambulatory Visit: Payer: Self-pay | Admitting: *Deleted

## 2020-11-27 DIAGNOSIS — C3490 Malignant neoplasm of unspecified part of unspecified bronchus or lung: Secondary | ICD-10-CM

## 2020-11-27 DIAGNOSIS — C3412 Malignant neoplasm of upper lobe, left bronchus or lung: Secondary | ICD-10-CM | POA: Diagnosis not present

## 2020-11-29 ENCOUNTER — Ambulatory Visit: Payer: Medicare Other

## 2020-12-04 ENCOUNTER — Other Ambulatory Visit: Payer: Self-pay

## 2020-12-04 ENCOUNTER — Other Ambulatory Visit: Payer: Medicare Other | Admitting: Primary Care

## 2020-12-04 DIAGNOSIS — E440 Moderate protein-calorie malnutrition: Secondary | ICD-10-CM

## 2020-12-04 DIAGNOSIS — Z515 Encounter for palliative care: Secondary | ICD-10-CM

## 2020-12-04 DIAGNOSIS — R911 Solitary pulmonary nodule: Secondary | ICD-10-CM

## 2020-12-04 DIAGNOSIS — Z87891 Personal history of nicotine dependence: Secondary | ICD-10-CM

## 2020-12-04 NOTE — Progress Notes (Signed)
[facility] Consult Note Telephone: [phone]  Fax: [phone]    Date of encounter: 12/04/20 PATIENT NAME: [name] [address] [place] [address]   [phone] (home)  DOB: 19-Mar-1946 MRN: [id number] PRIMARY CARE PROVIDER:    Baxter Hire, MD,  [place] [address] [phone]  REFERRING PROVIDER:  Cammie Sickle, MD [place],  [place] [address] [phone]   RESPONSIBLE PARTY:    Contact Information     Name Relation Home Work Mobile   Dudley,Jennifer Daughter [phone]     Sutton,Lisa D Daughter           I met face to face with patient and family in  home. Palliative Care was asked to follow this patient by consultation request of  Cammie Sickle, *  to address advance care planning and complex medical decision making. This is a follow up visit.                                   ASSESSMENT AND PLAN / RECOMMENDATIONS:   Advance Care Planning/Goals of Care: Goals include to maximize quality of life and symptom management. Our advance care planning conversation included a discussion about:    The value and importance of advance care planning  Experiences with loved ones who have been seriously ill or have died  Exploration of personal, cultural or spiritual beliefs that might influence medical decisions  Exploration of goals of care in the event of a sudden injury or illness  Identification of a healthcare agent  Review of an  advance directive document   CODE STATUS: FULL Advance care planning we discuss the value of planning in advance. Her daughter is here and discusses her desire to know her mother's wishes. Patient states she does not like speaking about advance plans. They outlined the choices on the MOST  form and ask her and her daughter to discuss. She stated she was not able to do this between my last visit and today. I've invited the  daughter to ask any questions and have forwarded her name at her request to our social workers to reach out regarding advance care planning documents such as I will and  powers of attorney she will meet with oncology and radiation oncology before our next visit.    Symptom Management/Plan:  Nutrition:  Fears getting fat  and she has lost  10 pounds recently. She endorses early satiety or anorexia altogether. She declines mirtazapine offered. She stated she does not like nutritional supplements.  Due to her weight loss she would be a candidate for hospice once her treatment options have been exhausted.   Pain from fall: Fell and landed on bottom on hard floor, and extreme pain ensued x 3 weeks. Tylenol and ibuprofen as needed, encouraged ATC. She did not have imaging and could have sustained a fracture to her pelvis based on clinical presentation. She declines imaging.  Disease process: Has labs 12/20/20, to see oncology and rad onc for f/u. Would recommend hospice for supportive care if concordant with clinicial goals.  Follow up Palliative Care Visit: Palliative care will continue to follow for complex medical decision making, advance care planning, and clarification of goals. Return 4-6 weeks or prn.  I spent 60 minutes providing this consultation. More than 50% of the time in this consultation was spent in counseling and  care coordination.  PPS: 40%  HOSPICE ELIGIBILITY/DIAGNOSIS: yes with concordant goals of care/weight loss  Chief Complaint: pain  HISTORY OF PRESENT ILLNESS:  [name] is a 75 y.o. year old female  with lung node, h/o smoking, unintentional weight loss, immobility, falls .   History obtained from review of EMR, discussion with primary team, and interview with family, facility staff/caregiver and/or [name].  I reviewed available labs, medications, imaging, studies and related documents from the EMR.  Records reviewed and summarized above.   ROS  General: endorses  pain from fall  EYES: denies vision changes ENMT: endorses dysphagia Cardiovascular: denies chest pain, endorses  DOE Pulmonary: endorses cough, endorses some  increased SOB Abdomen: endorses very poor  appetite, denies constipation,  endorses loose stools, endorses continence of bowel GU: denies dysuria, endorses continence of urine MSK:  endorses weakness,  +falls reported Skin: denies rashes or wounds Neurological: endorses pain, endorses  insomnia Psych: Endorses depressed mood Heme/lymph/immuno: denies bruises, abnormal bleeding  Physical Exam: Current and past weights: 100 lbs, 5 lb loss in 1 month, historically 120 lbs. Constitutional: NAD General: frail appearing, thin EYES: anicteric sclera, lids intact, no discharge  ENMT: intact hearing, oral mucous membranes moist CV:  no LE edema Pulmonary:  slight increased work of breathing, + cough, room air Abdomen: intake 25%, no ascites GU: deferred MSK: severe  sarcopenia, moves all extremities, ambulatory with help / cane Skin: warm and dry, no rashes or wounds on visible skin Neuro:  no generalized weakness,  no cognitive impairment Psych: anxious affect, A and O x 3 Hem/lymph/immuno: no widespread bruising   Thank you for the opportunity to participate in the care of [name].  The palliative care team will continue to follow. Please call our office at [phone] if we can be of additional assistance.   Jason Coop, NP , DNP, MPH, AGPCNP-BC, ACHPN  COVID-19 PATIENT SCREENING TOOL Asked and negative response unless otherwise noted:   Have you had symptoms of covid, tested positive or been in contact with someone with symptoms/positive test in the past 5-10 days?

## 2020-12-05 ENCOUNTER — Telehealth: Payer: Self-pay | Admitting: *Deleted

## 2020-12-05 NOTE — Telephone Encounter (Signed)
Daughter called requesting a Home Health aide to assist with taking care of the patient a few times a week

## 2020-12-05 NOTE — Telephone Encounter (Signed)
Dr. Jacinto Reap - please advise. Appears that pt's family has also reached out to pcp for frequent falls.

## 2020-12-07 NOTE — Telephone Encounter (Signed)
Dr. Marcha Dutton have not seen the patient since April. The insurance will most likely require a face to face to document this need.  Do you want the patient to be evaluated in [facility] or see patient in the office?

## 2020-12-08 NOTE — Telephone Encounter (Signed)
Attempted to reach patient at home as well as daughter. Line rings busy. Unable to leave any msgs.  Patient needs an apt in the clinic with [facility] to document medical necessity of home health aide (for insurance purposes) or she needs to reach to her pcp to arrange for home health aide

## 2020-12-20 ENCOUNTER — Inpatient Hospital Stay: Payer: Medicare Other | Attending: Internal Medicine | Admitting: Internal Medicine

## 2020-12-20 ENCOUNTER — Inpatient Hospital Stay: Payer: Medicare Other

## 2020-12-20 ENCOUNTER — Encounter: Payer: Self-pay | Admitting: Internal Medicine

## 2020-12-20 DIAGNOSIS — R911 Solitary pulmonary nodule: Secondary | ICD-10-CM

## 2020-12-20 DIAGNOSIS — C3412 Malignant neoplasm of upper lobe, left bronchus or lung: Secondary | ICD-10-CM | POA: Diagnosis not present

## 2020-12-20 NOTE — Assessment & Plan Note (Deleted)
#  Left upper lobe lung nodule-3.1 cm left apex-highly concerning for malignancy; along with borderline increase in size of the mediastinal lymph nodes. April 28th, 2022- PET scan-left upper lobe nodule suggestive of malignancy; equivocal right paratracheal lymph nodes.  #Discussed regarding biopsy-however I am concerned about tolerating the biopsy given her fragile respiratory status.  Will discuss at tumor conference.  I discussed option of radiation/SBRT; patient a poor candidate for any systemic chemotherapy/concurrent chemoradiation.  Recommend evaluation with Dr. Donella Stade.   # Clustered areas of tree-in-bud nodularity and subsegmental atelectasis noted within the lingula and anterior basal right upperLobe-likely infectious-on antibiotics as prednisone.  # Hodgkin's lymphoma stage II-status post chemotherapy radiation [ finish 2011]; clinically less likely recurrent Hodgkin's.   # Active smoker/discussed smoking cessation especially with bleomycin-patient not interested quitting smoking.  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  Continue to monitor closely.  # DISPOSITION: # Follow up in 3 months; MD; labs- cbc/cmplCT chest prior-Dr.B  Cc; hayley

## 2020-12-20 NOTE — Progress Notes (Signed)
I connected with [name] on 12/20/2020 at  3:15 PM EDT by video enabled telemedicine visit and verified that I am speaking with the correct person using two identifiers.  I discussed the limitations, risks, security and privacy concerns of performing an evaluation and management service by telemedicine and the availability of in-person appointments. I also discussed with the patient that there may be a patient responsible charge related to this service. The patient expressed understanding and agreed to proceed.    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  female with history of left upper lobe clinical stage I lung cancer s/p SBRT is here for follow-up.  Patient finished SBRT approximately a month ago.  She denies any worsening cough or shortness of breath.  She has chronic shortness of breath not any worse.  Denies any nausea vomiting abdominal pain.  Denies any worsening bone pain or joint pains.  No headaches.  Observation/objective: Alert & oriented x 3. In No acute distress.   Assessment and plan: Primary cancer of left upper lobe of lung ([place]) #Left upper lobe lung nodule-3.1 cm left apex-highly concerning for malignancy; along with borderline increase in size of the mediastinal  lymph nodes. April 28th, 2022- PET scan-left upper lobe nodule suggestive of malignancy; equivocal right paratracheal lymph nodes-s/p SBRT [finished early June 2022].  #Recommend follow-up CT scan in approximately 3 months-for response evaluation.   # Hodgkin's lymphoma stage II-status post chemotherapy radiation [ finish 2011]-again reviewed with the patient that clinically less likely recurrent Hodgkin's.  As patient is not a candidate for chemotherapy/radiation would be a palliative treatment for recurrent Hodgkin's also.  This is again reiterated the patient and daughter.  # Active smoker/discussed smoking cessation especially with bleomycin-patient not interested quitting smoking.  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  Continue to monitor closely.  # DISPOSITION: # Follow up in 3 months; MD; labs- cbc/cmplCT chest prior-Dr.B  Cc; hayley  Follow-up instructions:  I discussed the assessment and treatment plan with the patient.  The patient was provided an opportunity to ask questions and all were answered.  The patient agreed with the plan and demonstrated understanding of instructions.  The patient was advised to call back or seek an in person evaluation if the symptoms worsen or if the condition fails to improve as anticipated.  Dr. Charlaine Dalton Gravity at [facility] 12/26/2020 9:38 PM

## 2020-12-21 DIAGNOSIS — C3412 Malignant neoplasm of upper lobe, left bronchus or lung: Secondary | ICD-10-CM | POA: Insufficient documentation

## 2020-12-21 NOTE — Assessment & Plan Note (Addendum)
#  Left upper lobe lung nodule-3.1 cm left apex-highly concerning for malignancy; along with borderline increase in size of the mediastinal lymph nodes. April 28th, 2022- PET scan-left upper lobe nodule suggestive of malignancy; equivocal right paratracheal lymph nodes-s/p SBRT [finished early June 2022].  #Recommend follow-up CT scan in approximately 3 months-for response evaluation.   # Hodgkin's lymphoma stage II-status post chemotherapy radiation [ finish 2011]-again reviewed with the patient that clinically less likely recurrent Hodgkin's.  As patient is not a candidate for chemotherapy/radiation would be a palliative treatment for recurrent Hodgkin's also.  This is again reiterated the patient and daughter.  # Active smoker/discussed smoking cessation especially with bleomycin-patient not interested quitting smoking.  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  Continue to monitor closely.  # DISPOSITION: # Follow up in 3 months; MD; labs- cbc/cmplCT chest prior-Dr.B  Cc; hayley

## 2020-12-29 ENCOUNTER — Telehealth: Payer: Self-pay

## 2020-12-29 NOTE — Telephone Encounter (Signed)
(  8:20 am) SW returned call to patient's daughter [name] as she requested a call back regarding cleaning services. [name] stated she was given the name of a cleaning service and she had been unable to get in touch with anyone. SW forwarded [employer] the link for the program via email while on the phone with her.SW advised [name] that application must be done online.  SW encouraged [name] to call with any additional questions or concerns.

## 2021-01-01 ENCOUNTER — Ambulatory Visit
Admission: RE | Admit: 2021-01-01 | Discharge: 2021-01-01 | Disposition: A | Discharge status: Home / Self Care | Payer: Medicare Other | Source: Ambulatory Visit | Attending: Radiation Oncology | Admitting: Radiation Oncology

## 2021-01-01 ENCOUNTER — Encounter: Payer: Self-pay | Admitting: Radiation Oncology

## 2021-01-01 VITALS — BP 179/74 | HR 62 | Temp 97.0°F | Resp 16 | Wt 116.5 lb

## 2021-01-01 DIAGNOSIS — Z8572 Personal history of non-Hodgkin lymphomas: Secondary | ICD-10-CM | POA: Insufficient documentation

## 2021-01-01 DIAGNOSIS — C3412 Malignant neoplasm of upper lobe, left bronchus or lung: Secondary | ICD-10-CM

## 2021-01-01 DIAGNOSIS — R911 Solitary pulmonary nodule: Secondary | ICD-10-CM | POA: Diagnosis present

## 2021-01-01 DIAGNOSIS — Z923 Personal history of irradiation: Secondary | ICD-10-CM | POA: Diagnosis not present

## 2021-01-01 NOTE — Progress Notes (Signed)
Radiation Oncology Follow up Note  Name: [name]   Date:   01/01/2021 MRN:  [id number] DOB: Sep 07, 1945    This 75 y.o. female presents to the clinic today for 1 month follow-up status post SBRT to her left upper lobe for presumed stage I non-small cell lung cancer.  REFERRING PROVIDER: Baxter Hire, MD  HPI: Patient is a 75 year old female multiple medical comorbidities including Hodgkin's lymphoma now 1 month out having completed SBRT to her left upper lobe for presumed stage I non-small cell lung cancer.  She has multiple somatic complaints including shortness of breath mild cough and some chest tightness and pain over the last several weeks of unknown etiology.  She specifically denies hemoptysis..  COMPLICATIONS OF TREATMENT: none  FOLLOW UP COMPLIANCE: keeps appointments   PHYSICAL EXAM:  BP (!) 179/74   Pulse 62   Temp (!) 97 F (36.1 C)   Resp 16   Wt 116 lb 8 oz (52.8 kg)   SpO2 96%   BMI 22.75 kg/m  Frail-appearing thin female in NAD.  Well-developed well-nourished patient in NAD. HEENT reveals PERLA, EOMI, discs not visualized.  Oral cavity is clear. No oral mucosal lesions are identified. Neck is clear without evidence of cervical or supraclavicular adenopathy. Lungs are clear to A&P. Cardiac examination is essentially unremarkable with regular rate and rhythm without murmur rub or thrill. Abdomen is benign with no organomegaly or masses noted. Motor sensory and DTR levels are equal and symmetric in the upper and lower extremities. Cranial nerves II through XII are grossly intact. Proprioception is intact. No peripheral adenopathy or edema is identified. No motor or sensory levels are noted. Crude visual fields are within normal range.  RADIOLOGY RESULTS: No current films to review  PLAN: Present time patient is stable.  She is multiple somatic complaints hard to do decipher this from her baseline pulmonary functions.  Chest tightness also could be some scarring of  the left lung versus numerous other etiologies.  I have asked her to contact Dr. Sharmaine [facility] team and possible symptom management should they persist.  I have asked to see her back in 3 months for follow-up.  She already has a CT scan planned.  Patient knows to call with any concerns.  I would like to take this opportunity to thank you for allowing me to participate in the care of your patient.Noreene Filbert, MD

## 2021-01-12 ENCOUNTER — Telehealth: Payer: Self-pay | Admitting: Primary Care

## 2021-01-12 NOTE — Telephone Encounter (Signed)
Spoke with patient's daughter Donella Stade, to see if it was okay to change the time of the 01/16/21 Palliative f/u visit from 2 PM to 12:30 PM and she was in agreement with this.

## 2021-01-16 ENCOUNTER — Other Ambulatory Visit: Payer: Medicare Other | Admitting: Primary Care

## 2021-01-16 ENCOUNTER — Other Ambulatory Visit: Payer: Self-pay

## 2021-01-16 DIAGNOSIS — R630 Anorexia: Secondary | ICD-10-CM | POA: Insufficient documentation

## 2021-01-16 DIAGNOSIS — R6881 Early satiety: Secondary | ICD-10-CM | POA: Insufficient documentation

## 2021-01-16 DIAGNOSIS — R911 Solitary pulmonary nodule: Secondary | ICD-10-CM

## 2021-01-16 DIAGNOSIS — Z515 Encounter for palliative care: Secondary | ICD-10-CM

## 2021-01-16 DIAGNOSIS — E440 Moderate protein-calorie malnutrition: Secondary | ICD-10-CM

## 2021-01-16 DIAGNOSIS — Z87891 Personal history of nicotine dependence: Secondary | ICD-10-CM

## 2021-01-16 NOTE — Progress Notes (Signed)
[facility] Consult Note Telephone: [phone]  Fax: [phone]    Date of encounter: 01/16/21 PATIENT NAME: [name] [address] [place] [address]   [phone] (home)  DOB: 12-Apr-1946 MRN: [id number] PRIMARY CARE PROVIDER:    Baxter Hire, MD,  [place] [address] [phone]  REFERRING PROVIDER:   Cammie Sickle, MD [address] [place],  [place] [address] [phone]   RESPONSIBLE PARTY:    Contact Information     Name Relation Home Work Mobile   [name] Daughter [phone]     [name] Daughter [phone]          I met face to face with patient and family in  home/facility. Palliative Care was asked to follow this patient by consultation request of Cammie Sickle, *  to address advance care planning and complex medical decision making. This is a follow up visit.                                   ASSESSMENT AND PLAN / RECOMMENDATIONS:   Advance Care Planning/Goals of Care: Goals include to maximize quality of life and symptom management. Our advance care planning conversation included a discussion about:    The value and importance of advance care planning  Experiences with loved ones who have been seriously ill or have died  Exploration of personal, cultural or spiritual beliefs that might influence medical decisions  Exploration of goals of care in the event of a sudden injury or illness   Review of an  advance directive document- not ready to complete. Family to review.  CODE STATUS: Full code We discussed benefit to herself and family to create advance directives. She does not want to do it yet and will discuss on next visit..  Symptom Management/Plan:  Cleaning service : Has tried to connect with cleaning service for cancer patients,  they are to call back.   Nutrition: States she's stable at 116 lbs, daughter endorses early  satiety. She does not care for nutritional supplements. She states she has gotten down to 100 lbs.  I would like to start mirtazipine 7.5 mg po and sent 7.5 mg to advance to 15 mg to pharmacy with #60, 1 refill. This will also address her reported depression.  Oxygen: Has been approved for oxygen by pulmonary.  They are smoking in the home with oxygen, as their grandmother did. Uses it at hs, and daughter says pt takes off oxygen to smoke. Instructed oxygen is flammable and she could burn herself and home. She voices oncology told her it was ok to smoke but again, I reiterated the danger of anyone smoking with oxygen in use.  Dyspnea: using trellogy, and using nebs as needed. I refilled albuterol inhaler, it was ended in 3/22 and she'd not gotten a refill. Is able to speak at rest without a lot of exertion. I sent refill of albuterol HFA 90 mcg 2 puffs qid, refills #6  Follow up Palliative Care Visit: Palliative care will continue to follow for complex medical decision making, advance care planning, and clarification of goals. Return 8 weeks or prn.  I spent 60 minutes providing this consultation. More than 50% of the time in this consultation was spent in counseling and care coordination.  PPS: 40%  HOSPICE ELIGIBILITY/DIAGNOSIS: yes with concordant goals of care  Chief Complaint: anorexia  HISTORY OF PRESENT ILLNESS:  [name] is a 75 y.o. year old female  with h/o hodgkins lymphoma, presumed lung cancer, current and long term tobacco use and anorexia/early satiety .   History obtained from review of EMR, discussion with primary team, and interview with family, facility staff/caregiver and/or [name].  I reviewed available labs, medications, imaging, studies and related documents from the EMR.  Records reviewed and summarized above.   ROS   General: NAD ENMT: denies dysphagia Cardiovascular: denies chest pain, endorses  DOE Pulmonary:endorses cough, endorses  increased  SOB Abdomen: endorses poor appetite, denies constipation, endorses continence of bowel GU: denies dysuria, endorses continence of urine MSK:  endorses weakness,  no falls reported Skin: denies rashes or wounds Neurological: endorses occ  pain, denies insomnia Psych: Endorses depressed mood Heme/lymph/immuno: denies bruises, abnormal bleeding  Physical Exam: Current and past weights: 116 lbs per report Constitutional: NAD General: frail appearing, thin EYES: anicteric sclera, lids intact, no discharge  ENMT: intact hearing, oral mucous membranes moist CV: no LE edema Pulmonary:  slight  increased work of breathing, no cough, room air currently Abdomen: intake 25%,  no ascites GU: deferred MSK: severe  sarcopenia, moves all extremities, ambulatory (I) Skin: warm and dry, no rashes or wounds on visible skin Neuro:  ++ generalized weakness,  no cognitive impairment Psych: Anxious affect, A and O x 3 Hem/lymph/immuno: no widespread bruising   Thank you for the opportunity to participate in the care of [name].  The palliative care team will continue to follow. Please call our office at [phone] if we can be of additional assistance.   Jason Coop, NP   COVID-19 PATIENT SCREENING TOOL Asked and negative response unless otherwise noted:   Have you had symptoms of covid, tested positive or been in contact with someone with symptoms/positive test in the past 5-10 days?

## 2021-03-04 ENCOUNTER — Observation Stay
Admission: EM | Admit: 2021-03-04 | Discharge: 2021-03-07 | Disposition: A | Discharge status: Home / Self Care | Payer: Medicare Other | Attending: Student | Admitting: Student

## 2021-03-04 ENCOUNTER — Emergency Department: Payer: Medicare Other

## 2021-03-04 DIAGNOSIS — J449 Chronic obstructive pulmonary disease, unspecified: Secondary | ICD-10-CM | POA: Diagnosis not present

## 2021-03-04 DIAGNOSIS — J690 Pneumonitis due to inhalation of food and vomit: Secondary | ICD-10-CM

## 2021-03-04 DIAGNOSIS — Z79899 Other long term (current) drug therapy: Secondary | ICD-10-CM | POA: Insufficient documentation

## 2021-03-04 DIAGNOSIS — R0602 Shortness of breath: Secondary | ICD-10-CM | POA: Diagnosis present

## 2021-03-04 DIAGNOSIS — I10 Essential (primary) hypertension: Secondary | ICD-10-CM | POA: Diagnosis not present

## 2021-03-04 DIAGNOSIS — F17210 Nicotine dependence, cigarettes, uncomplicated: Secondary | ICD-10-CM | POA: Insufficient documentation

## 2021-03-04 DIAGNOSIS — R7401 Elevation of levels of liver transaminase levels: Secondary | ICD-10-CM | POA: Diagnosis not present

## 2021-03-04 DIAGNOSIS — R32 Unspecified urinary incontinence: Secondary | ICD-10-CM

## 2021-03-04 DIAGNOSIS — T17308A Unspecified foreign body in larynx causing other injury, initial encounter: Secondary | ICD-10-CM

## 2021-03-04 DIAGNOSIS — Z720 Tobacco use: Secondary | ICD-10-CM

## 2021-03-04 DIAGNOSIS — Z85118 Personal history of other malignant neoplasm of bronchus and lung: Secondary | ICD-10-CM | POA: Diagnosis not present

## 2021-03-04 DIAGNOSIS — R778 Other specified abnormalities of plasma proteins: Secondary | ICD-10-CM

## 2021-03-04 DIAGNOSIS — R55 Syncope and collapse: Secondary | ICD-10-CM | POA: Diagnosis not present

## 2021-03-04 DIAGNOSIS — Z20822 Contact with and (suspected) exposure to covid-19: Secondary | ICD-10-CM | POA: Insufficient documentation

## 2021-03-04 HISTORY — DX: Sequelae of poliomyelitis: B91

## 2021-03-04 HISTORY — DX: Chronic obstructive pulmonary disease, unspecified: J44.9

## 2021-03-04 HISTORY — DX: Emphysema, unspecified: J43.9

## 2021-03-04 LAB — COMPREHENSIVE METABOLIC PANEL
ALT: 42 U/L (ref 0–44)
AST: 80 U/L — ABNORMAL HIGH (ref 15–41)
Albumin: 3.3 g/dL — ABNORMAL LOW (ref 3.5–5.0)
Alkaline Phosphatase: 135 U/L — ABNORMAL HIGH (ref 38–126)
Anion gap: 7 (ref 5–15)
BUN: 16 mg/dL (ref 8–23)
CO2: 28 mmol/L (ref 22–32)
Calcium: 8.4 mg/dL — ABNORMAL LOW (ref 8.9–10.3)
Chloride: 101 mmol/L (ref 98–111)
Creatinine, Ser: 0.59 mg/dL (ref 0.44–1.00)
GFR, Estimated: >60 mL/min (ref >60)
Glucose, Bld: 184 mg/dL — ABNORMAL HIGH (ref 70–99)
Potassium: 4.7 mmol/L (ref 3.5–5.1)
Sodium: 136 mmol/L (ref 135–145)
Total Bilirubin: 0.6 mg/dL (ref 0.3–1.2)
Total Protein: 6.5 g/dL (ref 6.5–8.1)

## 2021-03-04 LAB — CBC
HCT: 46.3 % — ABNORMAL HIGH (ref 36.0–46.0)
Hemoglobin: 15.6 g/dL — ABNORMAL HIGH (ref 12.0–15.0)
MCH: 33.8 pg (ref 26.0–34.0)
MCHC: 33.7 g/dL (ref 30.0–36.0)
MCV: 100.4 fL — ABNORMAL HIGH (ref 80.0–100.0)
Platelets: 131 10*3/uL — ABNORMAL LOW (ref 150–400)
RBC: 4.61 MIL/uL (ref 3.87–5.11)
RDW: 13.9 % (ref 11.5–15.5)
WBC: 5.1 10*3/uL (ref 4.0–10.5)
nRBC: 0 % (ref 0.0–0.2)

## 2021-03-04 LAB — MAGNESIUM: Magnesium: 2 mg/dL (ref 1.7–2.4)

## 2021-03-04 LAB — TROPONIN I (HIGH SENSITIVITY)
Troponin I (High Sensitivity): 21 ng/L — ABNORMAL HIGH (ref <18)
Troponin I (High Sensitivity): 101 ng/L (ref <18)
Troponin I (High Sensitivity): 138 ng/L (ref <18)

## 2021-03-04 LAB — RESP PANEL BY RT-PCR (FLU A&B, COVID) ARPGX2
Influenza A by PCR: NEGATIVE
Influenza B by PCR: NEGATIVE
SARS Coronavirus 2 by RT PCR: NEGATIVE

## 2021-03-04 MED ORDER — ACETAMINOPHEN 650 MG RE SUPP: 650.0000 mg | Freq: Four times a day (QID) | RECTAL | Status: DC | PRN

## 2021-03-04 MED ORDER — ACETAMINOPHEN 325 MG PO TABS: 650.0000 mg | ORAL_TABLET | Freq: Four times a day (QID) | ORAL | Status: DC | PRN

## 2021-03-04 MED ORDER — MIRABEGRON ER 50 MG PO TB24
50.0000 mg | ORAL_TABLET | Freq: Every day | ORAL | Status: DC
  Administered 2021-03-05 – 2021-03-06 (×2): 50 mg via ORAL
  Filled 2021-03-04 (×3): qty 1

## 2021-03-04 MED ORDER — IOHEXOL 350 MG/ML SOLN
75.0000 mL | Freq: Once | INTRAVENOUS | Status: AC | PRN
  Administered 2021-03-04: 75 mL via INTRAVENOUS

## 2021-03-04 MED ORDER — LACTATED RINGERS IV SOLN: INTRAVENOUS | Status: AC

## 2021-03-04 MED ORDER — FLUTICASONE-UMECLIDIN-VILANT 100-62.5-25 MCG/INH IN AEPB: 1.0000 | INHALATION_SPRAY | Freq: Every day | RESPIRATORY_TRACT | Status: DC

## 2021-03-04 MED ORDER — PIPERACILLIN-TAZOBACTAM 3.375 G IVPB
3.3750 g | Freq: Three times a day (TID) | INTRAVENOUS | Status: DC
  Administered 2021-03-04 – 2021-03-05 (×2): 3.375 g via INTRAVENOUS
  Filled 2021-03-04 (×2): qty 50

## 2021-03-04 MED ORDER — ALBUTEROL SULFATE (2.5 MG/3ML) 0.083% IN NEBU: 3.0000 mL | INHALATION_SOLUTION | RESPIRATORY_TRACT | Status: DC | PRN

## 2021-03-04 NOTE — Discharge Instructions (Signed)
Return to the emergency department for any chest pain, shortness of breath trouble breathing or development of fever.  Otherwise please follow-up with your doctor regarding today's ER visit and her choking episode.

## 2021-03-04 NOTE — ED Notes (Signed)
Patient transported to CT 

## 2021-03-04 NOTE — ED Provider Notes (Signed)
[facility] Emergency Department Provider Note  Time seen: 10:49 AM  I have reviewed the triage vital signs and the nursing notes.   HISTORY  Chief Complaint Shortness of Breath   HPI [name] is a 75 y.o. female with a past medical history of COPD on 3 L of oxygen 24/7, presents to the emergency department after choking and syncopal event.  According to EMS per family report patient was drinking coffee when she began choking/coughing.  Patient had a syncopal event and went unresponsive.  EMS states fire department initially responded and found the patient to be unresponsive with a saturation of 77% on 3 L nasal cannula which is her baseline.  EMS states when they arrived patient was on a nonrebreather satting around 100% and became more more responsive.  Patient initially refused to be transported to the emergency department but was eventually agreeable.  Here the patient denies any chest pain or abdominal pain.  Denies any increased shortness of breath.  Denies any recent cough or fever.  No past medical history on file.  There are no problems to display for this patient.   Prior to Admission medications   Not on File    Not on File  No family history on file.  Social History    Review of Systems Constitutional: Negative for fever.  Syncopal event. Cardiovascular: Negative for chest pain. Respiratory: Positive for choking/coughing this morning. Gastrointestinal: Negative for abdominal pain, vomiting Genitourinary: Negative for urinary compaints Musculoskeletal: Negative for musculoskeletal complaints Neurological: Negative for headache All other ROS negative  ____________________________________________   PHYSICAL EXAM:  Constitutional: Alert and oriented.  No acute distress.  Sitting in bed calmly. Eyes: Normal exam ENT      Head: Normocephalic and atraumatic.      Mouth/Throat: Mucous membranes are moist. Cardiovascular: Normal rate,  regular rhythm.  Respiratory: Normal respiratory effort without tachypnea nor retractions. Breath sounds are clear.  Without any obvious wheeze or rhonchi. Gastrointestinal: Soft and nontender. No distention.  Musculoskeletal: Nontender with normal range of motion in all extremities.  Neurologic:  Normal speech and language. No gross focal neurologic deficits  Skin:  Skin is warm, dry and intact.  Psychiatric: Mood and affect are normal.   ____________________________________________    EKG  EKG viewed and interpreted by myself shows a normal sinus rhythm at 76 bpm with a narrow QRS, normal axis, normal intervals, no concerning ST changes.  ____________________________________________    RADIOLOGY  Chest x-ray shows no significant abnormality.  ____________________________________________   INITIAL IMPRESSION / ASSESSMENT AND PLAN / ED COURSE  Pertinent labs & imaging results that were available during my care of the patient were reviewed by me and considered in my medical decision making (see chart for details).   Patient presents to the emergency department for choking and syncope.  Patient was drinking coffee this morning when she began choking and coughing, resulting in a syncopal event unresponsiveness and a decreased O2 saturation of 77% on 3 L.  Patient initially refused transport but ultimately was agreeable.  Here the patient is awake alert, no complaints.  Denies any shortness of breath.  Patient currently satting in the upper 90s on her typical 3 L of oxygen.  We will check labs, chest x-ray and continue to closely monitor.  Patient agreeable to plan of care.  EKG shows no concerning findings.  Patient's repeat heart enzyme has gone from 28-101.  Discussed with the patient, my recommendation to admit her to the hospital  service for further work-up treatment and to continue to trend her heart enzymes with possible cardiology consultation.  Patient strongly wishes to go home.   I called the daughter on the phone and discussed with the daughter the patient situation and her desire to go home.  Daughter is now talking to the patient they have agreed upon checking the heart enzyme 1 more time.  If the heart enzyme continues to elevate they are agreeable to admission and if the heart enzyme trends down she wishes to go home.  I believe this is a reasonable plan of care although I did again reiterate my recommendation to just be admitted to the hospital so we can trend the enzyme multiple times as well as get cardiology involved if needed.  Patient again does not wish to be admitted but is agreeable to admission if this third enzyme elevates otherwise she wishes to go home.  Patient care signed out to oncoming provider.  [name] was evaluated in Emergency Department on 03/04/2021 for the symptoms described in the history of present illness. She was evaluated in the context of the global COVID-19 pandemic, which necessitated consideration that the patient might be at risk for infection with the SARS-CoV-2 virus that causes COVID-19. Institutional protocols and algorithms that pertain to the evaluation of patients at risk for COVID-19 are in a state of rapid change based on information released by regulatory bodies including the CDC and federal and state organizations. These policies and algorithms were followed during the patient's care in the ED.  ____________________________________________   FINAL CLINICAL IMPRESSION(S) / ED DIAGNOSES  Syncope Aspiration Elevated troponin   Harvest Dark, MD 03/04/21 1434

## 2021-03-04 NOTE — ED Notes (Signed)
DO at bedside covering tx plan. NADN

## 2021-03-04 NOTE — H&P (Signed)
History and Physical    PLEASE NOTE THAT DRAGON DICT[employer] SOFTWARE WAS USED IN THE CONSTRUCTION OF THIS NOTE.   [name] DXI:[id number] DOB: 04-20-1946 DOA: 03/04/2021  PCP: Baxter Hire, MD Patient coming from: home   I have personally briefly reviewed patient's old medical records in [place]  Chief Complaint: Syncope  HPI: [name] is a 75 y.o. female with medical history significant for COPD on nocturnal 3 L nasal cannula, chronic tobacco abuse, who is admitted to [facility] on 03/04/2021  For further evaluation and management of syncopal episode after presenting from home to [facility] ED complaining of such.   Patient reports that she was at her baseline level of health when she began to choke on some coffee as she was consuming at home.  She reports that the coffee " went down the wrong way" and that she immediately developed significant coughing associated with shortness of breath.  During this episode, she lost consciousness, as witnessed by her daughter.  Consequently, the patient fell forward to the floor below, but did not hit her head as a component of this.  Daughter reported that the patient remained unconscious for only few seconds before regaining consciousness.  The patient reports that this episode was not associated with any nausea, vomiting, chest pain.  In assessing for any preceding dizziness, lightheadedness, or additional presyncopal symptoms leading up to her over loss of consciousness, the patient conveys that she is unsure if she experienced any of the symptoms in the seconds leading up to her syncopal episode, noting that her focus was on the cough and shortness of breath that she was experiencing at that time.  Denies any prior history of syncope.  Denies any associated new onset peripheral edema, calf tenderness, or new lower extremity erythema.  She reports no change in body position immediately preceding the above syncopal event,  including no recent preceding rising from a seated to a standing position.  She also denies any recent preceding micturition.  No recent abdominal pain, diarrhea, melena, or hematochezia.  This episode was not associated with any generalized tonic-clonic activity, nor associate with any tongue biting or loss of bowel/bladder function.  The patient denies any associated acute focal weakness, acute focal numbness, paresthesias, facial droop, slurred speech, expressive aphasia, acute change in vision, dysphagia, vertigo.  Denies any associated or ensuing headache or neck pain. Denies any additional resultant acute arthralgias or myalgias.    Denies any recent subjective fever, chills, rigors, or generalized myalgias.  No recent neck stiffness, rhinitis, rhinorrhea, sore throat. No recent traveling or known COVID-19 exposures. Denies any recent hemoptysis.  Not associated with any recent dysuria, gross hematuria, or change in urinary urgency/frequency.  She conveys a history of esophageal stricture for which she underwent esophageal dilation approximately 4 years ago.  In the interval, she denies any issues at all with dysphagia leading up to today's isolated choking/coughing episode.  She denies any history of requiring modifications to her diet, noting no history of requiring mechanical soft or thickened liquids modifications.  Medical history notable for COPD for which she reports that she is on 3 L nasal cannula, but only on a nocturnal basis.  She denies supplemental oxygen requirement during the day.       ED Course:  Vital signs in the ED were notable for the following: Tetramex 98.1; heart rates 58-76 will, blood pressure 91/68 -109/69; respiratory rate 18-22, oxygen saturation 95 to 100% on 3 L nasal cannula.  Labs were notable for the following: CMP notable for the following: Bicarbonate 28, creatinine 0.59, albumin 2.3, alkaline phosphatase 135, AST 80, ALT 42, total bilirubin 0.6.   High-sensitivity troponin high initially noted to be 21, for trending up to 101, with third value trending up slightly to 138.  CBC notable for white blood cell count 5100.  Screening nasopharyngeal COVID-19/influenza PCR were checked in the ED this evening and found to be negative.  Blood cultures x2 collected prior to initiation of IV antibiotics in the ED.  Imaging and additional notable ED work-up: EKG shows sinus rhythm with heart rate 76, normal intervals, no T wave changes, and nonspecific less than 1 mm ST depression limited to V4, in the absence of any evidence of ST elevation.  CTA chest showed no evidence of acute pulmonary embolism, but did show patchy opacities in the right upper lobe and right middle lobe consistent with infectious versus inflammatory process.   Subsequently, the patient was admitted for overnight observation for further evaluation and management of presenting syncope in the setting of suspected aspiration pneumonia versus aspiration pneumonitis.    Review of Systems: As per HPI otherwise 10 point review of systems negative.   Past Medical History:  Diagnosis Date   COPD (chronic obstructive pulmonary disease) ([place])    Emphysema of lung ([place])    Poliomyelitis osteopathy of multiple sites [facility])     History reviewed. No pertinent surgical history.  Social History:  reports that she has been smoking cigarettes. She has been smoking an average of .5 packs per day. She has never used smokeless tobacco. She reports that she does not drink alcohol and does not use drugs.   Allergies  Allergen Reactions   Prednisone Rash    History reviewed. No pertinent family history.   Outpatient medications include the following: Trelegy, as needed albuterol inhaler, and Myrbetriq.  Objective    Physical Exam: Vitals:   03/04/21 1630 03/04/21 1725 03/04/21 1730 03/04/21 1800  BP: 91/77 99/66 106/65 104/67  Pulse: (!) 58 62 63 61  Resp: '16 16 16 18  ' Temp:       TempSrc:      SpO2: 98% 97% 94% 100%  Weight:      Height:        General: appears to be stated age; alert, oriented Skin: warm, dry, no rash Head:  AT/ Mouth:  Oral mucosa membranes appear moist, normal dentition Neck: supple; trachea midline Heart:  RRR; did not appreciate any M/R/G Lungs: Right-sided rales noted, but otherwise CTAB, did not appreciate any wheezes or rhonchi Abdomen: + BS; soft, ND, NT Vascular: 2+ pedal pulses b/l; 2+ radial pulses b/l Extremities: no peripheral edema, no muscle wasting Neuro: strength and sensation intact in upper and lower extremities b/l    Labs on Admission: I have personally reviewed following labs and imaging studies  CBC: Recent Labs  Lab 03/04/21 1049  WBC 5.1  HGB 15.6*  HCT 46.3*  MCV 100.4*  PLT 408*   Basic Metabolic Panel: Recent Labs  Lab 03/04/21 1049  NA 136  K 4.7  CL 101  CO2 28  GLUCOSE 184*  BUN 16  CREATININE 0.59  CALCIUM 8.4*   GFR: Estimated Creatinine Clearance: 49.6 mL/min (by C-G formula based on SCr of 0.59 mg/dL). Liver Function Tests: Recent Labs  Lab 03/04/21 1049  AST 80*  ALT 42  ALKPHOS 135*  BILITOT 0.6  PROT 6.5  ALBUMIN 3.3*   No results for input(s): LIPASE,  AMYLASE in the last 168 hours. No results for input(s): AMMONIA in the last 168 hours. Coagulation Profile: No results for input(s): INR, PROTIME in the last 168 hours. Cardiac Enzymes: No results for input(s): CKTOTAL, CKMB, CKMBINDEX, TROPONINI in the last 168 hours. BNP (last 3 results) No results for input(s): PROBNP in the last 8760 hours. HbA1C: No results for input(s): HGBA1C in the last 72 hours. CBG: No results for input(s): GLUCAP in the last 168 hours. Lipid Profile: No results for input(s): CHOL, HDL, LDLCALC, TRIG, CHOLHDL, LDLDIRECT in the last 72 hours. Thyroid Function Tests: No results for input(s): TSH, T4TOTAL, FREET4, T3FREE, THYROIDAB in the last 72 hours. Anemia Panel: No results for  input(s): VITAMINB12, FOLATE, FERRITIN, TIBC, IRON, RETICCTPCT in the last 72 hours. Urine analysis: No results found for: COLORURINE, APPEARANCEUR, LABSPEC, PHURINE, GLUCOSEU, HGBUR, BILIRUBINUR, KETONESUR, PROTEINUR, UROBILINOGEN, NITRITE, LEUKOCYTESUR  Radiological Exams on Admission: CT Angio Chest PE W and/or Wo Contrast  Result Date: 03/04/2021 CLINICAL DATA:  Syncope. Rising troponins. Choking episode. Evaluate for aspiration. Known malignancy. EXAM: CT ANGIOGRAPHY CHEST WITH CONTRAST TECHNIQUE: Multidetector CT imaging of the chest was performed using the standard protocol during bolus administration of intravenous contrast. Multiplanar CT image reconstructions and MIPs were obtained to evaluate the vascular anatomy. CONTRAST:  51m OMNIPAQUE IOHEXOL 350 MG/ML SOLN COMPARISON:  CT scan of the chest September 28, 2020. FINDINGS: Cardiovascular: Coronary artery disease involves the LAD and circumflex coronary arteries. Heart size is normal. The thoracic aorta is normal in caliber. Atherosclerotic change identified. No dissection identified. No pulmonary emboli. Mediastinum/Nodes: The chest wall is normal. The thyroid is unremarkable. The esophagus is normal. No adenopathy identified in the chest. No effusions. Lungs/Pleura: Central airways are normal.  No pneumothorax. Patchy ground-glass opacity identified in the right upper lobe and right middle lobe is consistent with an infectious or inflammatory process. Mild dependent atelectasis in the right lower lobe. No new or suspicious nodules/masses in the right lung. Atelectasis is identified medially in the right upper lobe. 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 There is now a band of opacity extending medial to the known malignancy, probably scarring. When this band of opacity is excluded as seen on series 6, image 14, the primary malignancy is smaller in the interval measuring 22 x 15 by 17 mm today versus 30  by 20 by 31 mm previously. Another nodule is identified in the left upper lobe on series 6, image 27 measuring up to 15 mm in greatest dimension, cranial caudal, on today's study. This nodule was not clearly seen previously. A small peripheral nodule seen in the left apex on series 6, image 17 measuring 8 mm in greatest dimension. This nodule does not appear particularly focal for solid on coronal or sagittal images. This may represent atelectasis or post radiation change. No other nodules or masses are identified. Emphysematous changes are seen throughout the lungs. Upper Abdomen: No acute abnormality. Musculoskeletal: No chest wall abnormality. No acute or significant osseous findings. Review of the MIP images confirms the above findings. IMPRESSION: 1. No pulmonary emboli identified. 2. Patchy ground-glass opacities in the right upper and middle lobes consistent with an infectious or inflammatory process. 3. 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 There is now a band of opacity extending medial to the site of the known malignancy. This new band of opacity is favored to represent scarring rather than tumor enlargement. When this band  of opacity is excluded, the primary malignancy is smaller in the interval measuring 2.2 x 1.5 x 1.7 cm today versus 3.0 x 2.0 x 3.1 cm previously. Recommend attention on follow-up. 4. Another nodule is identified in the left upper lobe on series 6, image 27 measuring up to 15 mm in greatest dimension. This nodule is concerning for further malignancy. Recommend attention on follow-up. Alternatively, a PET-CT could further evaluate. 5. A small peripheral nodule in the left apex measuring up to 8 mm is not particularly solid in appearance on coronal or sagittal imaging and favored represent atelectasis or post treatment change. Recommend attention on follow-up. 6. Emphysematous changes in the lungs. 7. Coronary artery disease. Atherosclerotic  change in the thoracic aorta. Aortic Atherosclerosis (ICD10-I70.0) and Emphysema (ICD10-J43.9). Electronically Signed   By: Dorise Bullion III M.D.   On: 03/04/2021 17:38   DG Chest Portable 1 View  Result Date: 03/04/2021 CLINICAL DATA:  Cough, possible aspiration EXAM: PORTABLE CHEST 1 VIEW COMPARISON:  CT chest 09/28/2020 FINDINGS: The heart size and mediastinal contours are within normal limits. A nodule of the left pulmonary apex is not clearly appreciated on this examination, with some irregular opacity in this vicinity. The visualized skeletal structures are unremarkable. IMPRESSION: 1.  No acute abnormality of the lungs. 2. A nodule of the left pulmonary apex is not clearly appreciated on this examination, with some irregular opacity in this vicinity. This may reflect interval treatment response of known malignancy and could be better evaluated by staging CT on a nonemergent basis. Electronically Signed   By: Eddie Candle M.D.   On: 03/04/2021 11:38     EKG: Independently reviewed, with result as described above.    Assessment/Plan   [name] is a 75 y.o. female with medical history significant for COPD on nocturnal 3 L nasal cannula, chronic tobacco abuse, who is admitted to [facility] on 03/04/2021  For further evaluation and management of syncopal episode after presenting from home to [facility] ED complaining of such.    Principal Problem:   Syncope Active Problems:   Elevated troponin   Aspiration pneumonia (HCC)   COPD (chronic obstructive pulmonary disease) (HCC)   Urinary incontinence   Tobacco abuse     #) Syncope: 1 episode of syncope that appears to be as a consequence of transient episode of acute hypoxia in the setting of self-limited episode of choking/aspiration.  Patient unsure if this event was associated with any prodrome.  Orthostatic hypotension appears less likely given the above history and patient's report of new preceding positional  changes leading up to her episode of syncope.  Not on any antihypertensive medications at home.  Not associated with any overt acute focal neurologic deficits. Clinically, acute ischemic stroke versus seizures appear less likely at this time.  While labs are associated with mild elevation of troponin, this is suspected to be on the basis of type II supply demand mismatch as a consequence of aforementioned transient hypoxia that resulted from the choking/aspiration episode itself.  In this context, presentation appears less consistent with ACS at this time, including EKG showing no evidence of acute ischemic changes, while the patient denies any recent or associated chest pain.  CTA chest findings consistent with right-sided aspiration pneumonia versus aspiration pneumonitis, while showing no evidence of acute pulmonary embolism, edema, or pneumothorax. Will continue to trend serial troponin while closely monitoring on telemetry.     Plan: Trend serial troponin.  Monitor on telemetry.  Add on serum  magnesium level.  Fall precautions.  Echocardiogram has been ordered for the morning. NPO pending nursing bedside swallow evaluation and additional evaluation and management of potential aspiration pneumonia, as further detailed below.      #) Aspiration pneumonia: The setting of episode of coughing/choking while consuming coffee earlier today, CTA chest demonstrates patchy opacities in the right upper lobe and right middle lobe felt to be consistent with infectious versus inflammatory process raising the possibility of aspiration pneumonia versus aspiration pneumonitis.  At this time, criteria for sepsis not met, as sole positive SIRS criteria at this time is mild tachypnea.  She does not appear to be in any acute respiratory distress, maintaining oxygen saturations in the high 90s on 3 L nasal cannula, which, at baseline, the patient is on nocturnally.  At this time, the differential for PE patchy opacities in  the right upper lobe/right middle lobe in the context of preceding isolated episode of coughing/choking consistent with an episode of aspiration includes aspiration pneumonia versus aspiration pneumonitis.  We will closely monitor ensuing clinic along with pursuit of procalcitonin level to further assist with differentiation of these pathologies.,  Will initiate IV Zosyn for its inclusion of anaerobic given potential for aspiration pneumonia.   Plan: Check blood cultures x2 followed by initiation of Zosyn.  Repeat CBC with differential.  Monitor continuous pulse oximetry.  Check procalcitonin level.  As needed albuterol inhaler.  NPO pending nursing bedside swallow screen.      #) Elevated troponin: mildly elevated initial troponin up to 138, with trend further quantified above. N suspect that this mildly elevated troponin is on the basis of supply demand mismatch in the setting of a self-limited episode of diminished oxygen delivery capacity as a consequence of transient episode of acute hypoxia that occurred during episode of active aspiration, as further detailed above, as opposed to representing a type I process due to acute plaque rupture.  EKG shows no evidence of acute ischemic changes, including no evidence of STEMI.  CTA chest shows no evidence of acute pulmonary embolism, but rather demonstrates patchy opacities in the right upper and middle lobes suggestive of aspiration pneumonia versus aspiration pneumonitis, and consistent with the suspected precipitating episode of aspiration, as above, in the absence of any evidence of acutely decompensated heart failure or pneumothorax.  Furthermore, presentation is not associate with any chest pain. Overall, ACS is felt to be less likely relative to type 2 supply demand mismatch, as above, but will closely monitor on telemetry overnight while closely trending ensuing troponin level and providing further evaluation management of suspected precipitating  aspiration episode, as further detailed above.     Plan: Trend serial troponin overnight, with repeat troponin values ordered for 2200 as well as with tomorrow morning labs.  Monitor on telemetry. PRN EKG for development of chest pain. PCheck serum Mg level and repeat BMP in the morning, with prn supplementation to maintain Mg and potassium levels greater than or equal to 2.0 and 4.0, respectively, to further reduce risk of ventricular arrhythmia. Repeat CBC in the AM. Additional evaluation and management of presenting aspiration pneumonia versus aspiration pneumonitis, as further detailed above.  Monitor continuous pulse oximetry.  Echocardiogram has been ordered for the morning to assess for focal wall motion abnormalities.      #) COPD: Documented history of such in the setting of long smoking history, with the patient reporting that she is on 3 L nasal cannula at home, but only on nocturnal basis.  Otherwise, the patient  denies any baseline supplemental oxygen requirements.  No evidence of acute COPD exacerbation at this time, although she is at risk for development of such in the setting of presenting aspiration pneumonia versus aspiration pneumonitis, as above.  Outpatient respiratory regimen consists of Trelegy as well as as needed albuterol inhaler.  Plan: Resume home Trelegy as well as as needed albuterol inhaler.  Monitor continuous pulse oximetry.  Repeat BMP in the morning.  Further evaluation and management of suspected aspiration pneumonia versus aspiration pneumonitis, including initiation of Zosyn and assessment of procalcitonin, as further detailed above.       #) Chronic tobacco abuse: Patient acknowledges that she is a current smoker, smoking half pack per day for at least the last 20 to 25 years.   Plan: Counseled the patient importance of complete smoking discontinuation in particular in the setting of an underlying history of COPD.      #) Urinary incontinence:  Documented history of such, on Myrbetriq as an outpatient.  Plan: Continue home Myrbetriq .  Monitor strict I's and O's Daily weights.  Repeat BMP in the morning.     DVT prophylaxis: SCDs Code Status: Full code Family Communication: none Disposition Plan: Per Rounding Team Consults called: none;  Admission status: Observation;     Of note, this patient was added by me to the following Admit List/Treatment Team: armcadmits.    Of note, the Adult Admission Order Set (Multimorbid order set) was used by me in the admission process for this patient.   PLEASE NOTE THAT DRAGON DICTATION SOFTWARE WAS USED IN THE CONSTRUCTION OF THIS NOTE.   [facility] Pager [phone] From [place]  Otherwise, please contact night-coverage  [email or web address] Password [facility]   03/04/2021, 6:38 PM

## 2021-03-04 NOTE — Progress Notes (Signed)
Pharmacy Antibiotic Note  [name] is a 75 y.o. female with history of COPD on 3L O2 at baseline admitted on 03/04/2021 with aspiration pneumonia.  Pharmacy has been consulted for Zosyn dosing.  Plan: Zosyn 3.375g IV q8h (4 hour infusion). Monitor clinical picture, renal function F/U C&S, abx de-escalation, LOT   Height: 5' (152.4 cm) Weight: 58.9 kg (129 lb 14.4 oz) IBW/kg (Calculated) : 45.5  Temp (24hrs), Avg:98.1 F (36.7 C), Min:98.1 F (36.7 C), Max:98.1 F (36.7 C)  Recent Labs  Lab 03/04/21 1049  WBC 5.1  CREATININE 0.59    Estimated Creatinine Clearance: 49.6 mL/min (by C-G formula based on SCr of 0.59 mg/dL).    Allergies  Allergen Reactions   Prednisone     Severe stomach issues    Antimicrobials this admission: Zosyn 9/18>  Microbiology results: 9/18 RVP neg  Brendolyn Patty, PharmD Clinical Pharmacist  03/04/2021   7:30 PM

## 2021-03-04 NOTE — ED Triage Notes (Signed)
Pt BIBA from home. Pt was drinking coffee and started choking. Pt fell and went unresponsive. Fire was called out, where pt was satting 77% on 3L. Pt placed on NRB. Lung sounds were diminished lower, rales upper. Pt has hx of COPD, emphysema, tremors. Pt wears 3L at baseline. Pt received 2 albuterol tx with EMS. VS with EMS: 30 RR, 53 ETCO2, 235 CBG

## 2021-03-05 ENCOUNTER — Encounter: Payer: Self-pay | Admitting: Internal Medicine

## 2021-03-05 DIAGNOSIS — R778 Other specified abnormalities of plasma proteins: Secondary | ICD-10-CM | POA: Diagnosis not present

## 2021-03-05 DIAGNOSIS — J449 Chronic obstructive pulmonary disease, unspecified: Secondary | ICD-10-CM | POA: Diagnosis not present

## 2021-03-05 DIAGNOSIS — J690 Pneumonitis due to inhalation of food and vomit: Secondary | ICD-10-CM | POA: Diagnosis present

## 2021-03-05 DIAGNOSIS — C3492 Malignant neoplasm of unspecified part of left bronchus or lung: Secondary | ICD-10-CM

## 2021-03-05 DIAGNOSIS — R32 Unspecified urinary incontinence: Secondary | ICD-10-CM | POA: Diagnosis present

## 2021-03-05 DIAGNOSIS — R55 Syncope and collapse: Secondary | ICD-10-CM | POA: Diagnosis not present

## 2021-03-05 DIAGNOSIS — Z720 Tobacco use: Secondary | ICD-10-CM | POA: Diagnosis present

## 2021-03-05 DIAGNOSIS — A809 Acute poliomyelitis, unspecified: Secondary | ICD-10-CM

## 2021-03-05 LAB — TROPONIN I (HIGH SENSITIVITY): Troponin I (High Sensitivity): 91 ng/L — ABNORMAL HIGH (ref <18)

## 2021-03-05 LAB — COMPREHENSIVE METABOLIC PANEL
ALT: 23 U/L (ref 0–44)
AST: 18 U/L (ref 15–41)
Albumin: 2.8 g/dL — ABNORMAL LOW (ref 3.5–5.0)
Alkaline Phosphatase: 90 U/L (ref 38–126)
Anion gap: 4 — ABNORMAL LOW (ref 5–15)
BUN: 20 mg/dL (ref 8–23)
CO2: 31 mmol/L (ref 22–32)
Calcium: 8.5 mg/dL — ABNORMAL LOW (ref 8.9–10.3)
Chloride: 104 mmol/L (ref 98–111)
Creatinine, Ser: 0.53 mg/dL (ref 0.44–1.00)
GFR, Estimated: >60 mL/min (ref >60)
Glucose, Bld: 97 mg/dL (ref 70–99)
Potassium: 4.5 mmol/L (ref 3.5–5.1)
Sodium: 139 mmol/L (ref 135–145)
Total Bilirubin: 0.8 mg/dL (ref 0.3–1.2)
Total Protein: 5.6 g/dL — ABNORMAL LOW (ref 6.5–8.1)

## 2021-03-05 LAB — CBC WITH DIFFERENTIAL/PLATELET
Abs Immature Granulocytes: 0.03 10*3/uL (ref 0.00–0.07)
Basophils Absolute: 0 10*3/uL (ref 0.0–0.1)
Basophils Relative: 0 %
Eosinophils Absolute: 0.2 10*3/uL (ref 0.0–0.5)
Eosinophils Relative: 3 %
HCT: 38 % (ref 36.0–46.0)
Hemoglobin: 12.4 g/dL (ref 12.0–15.0)
Immature Granulocytes: 0 %
Lymphocytes Relative: 14 %
Lymphs Abs: 1 10*3/uL (ref 0.7–4.0)
MCH: 33 pg (ref 26.0–34.0)
MCHC: 32.6 g/dL (ref 30.0–36.0)
MCV: 101.1 fL — ABNORMAL HIGH (ref 80.0–100.0)
Monocytes Absolute: 0.5 10*3/uL (ref 0.1–1.0)
Monocytes Relative: 8 %
Neutro Abs: 5 10*3/uL (ref 1.7–7.7)
Neutrophils Relative %: 75 %
Platelets: 117 10*3/uL — ABNORMAL LOW (ref 150–400)
RBC: 3.76 MIL/uL — ABNORMAL LOW (ref 3.87–5.11)
RDW: 13.7 % (ref 11.5–15.5)
WBC: 6.7 10*3/uL (ref 4.0–10.5)
nRBC: 0 % (ref 0.0–0.2)

## 2021-03-05 LAB — CK: Total CK: 38 U/L (ref 38–234)

## 2021-03-05 LAB — MAGNESIUM: Magnesium: 1.8 mg/dL (ref 1.7–2.4)

## 2021-03-05 LAB — PROCALCITONIN: Procalcitonin: <0.1 ng/mL

## 2021-03-05 MED ORDER — LEVOTHYROXINE SODIUM 50 MCG PO TABS
75.0000 ug | ORAL_TABLET | Freq: Every morning | ORAL | Status: DC
  Administered 2021-03-06: 75 ug via ORAL
  Filled 2021-03-05: qty 2

## 2021-03-05 MED ORDER — UMECLIDINIUM BROMIDE 62.5 MCG/INH IN AEPB
1.0000 | INHALATION_SPRAY | Freq: Every day | RESPIRATORY_TRACT | Status: DC
  Administered 2021-03-05 – 2021-03-06 (×2): 1 via RESPIRATORY_TRACT
  Filled 2021-03-05: qty 7

## 2021-03-05 MED ORDER — METOPROLOL TARTRATE 25 MG PO TABS
25.0000 mg | ORAL_TABLET | Freq: Two times a day (BID) | ORAL | Status: DC
  Filled 2021-03-05: qty 1

## 2021-03-05 MED ORDER — DIVALPROEX SODIUM 250 MG PO DR TAB
250.0000 mg | DELAYED_RELEASE_TABLET | Freq: Two times a day (BID) | ORAL | Status: DC
  Administered 2021-03-05 – 2021-03-06 (×2): 250 mg via ORAL
  Filled 2021-03-05 (×2): qty 1

## 2021-03-05 MED ORDER — MIRTAZAPINE 15 MG PO TABS
15.0000 mg | ORAL_TABLET | Freq: Every day | ORAL | Status: DC
  Administered 2021-03-05: 15 mg via ORAL
  Filled 2021-03-05: qty 1

## 2021-03-05 MED ORDER — SODIUM CHLORIDE 0.9 % IV SOLN
3.0000 g | Freq: Four times a day (QID) | INTRAVENOUS | Status: DC
  Administered 2021-03-05 – 2021-03-06 (×5): 3 g via INTRAVENOUS
  Filled 2021-03-05 (×6): qty 8

## 2021-03-05 MED ORDER — FLUTICASONE FUROATE-VILANTEROL 100-25 MCG/INH IN AEPB
1.0000 | INHALATION_SPRAY | Freq: Every day | RESPIRATORY_TRACT | Status: DC
  Administered 2021-03-05 – 2021-03-06 (×2): 1 via RESPIRATORY_TRACT
  Filled 2021-03-05: qty 28

## 2021-03-05 MED ORDER — KETOROLAC TROMETHAMINE 15 MG/ML IJ SOLN
15.0000 mg | Freq: Three times a day (TID) | INTRAMUSCULAR | Status: DC | PRN
  Administered 2021-03-05: 15 mg via INTRAVENOUS
  Filled 2021-03-05 (×2): qty 1

## 2021-03-05 MED ORDER — PAROXETINE HCL 20 MG PO TABS
20.0000 mg | ORAL_TABLET | Freq: Every evening | ORAL | Status: DC
  Administered 2021-03-05 – 2021-03-06 (×2): 20 mg via ORAL
  Filled 2021-03-05 (×3): qty 1

## 2021-03-05 MED ORDER — ALPRAZOLAM 0.5 MG PO TABS
0.2500 mg | ORAL_TABLET | Freq: Once | ORAL | Status: AC
  Administered 2021-03-05: 0.25 mg via ORAL
  Filled 2021-03-05: qty 1

## 2021-03-05 NOTE — Progress Notes (Signed)
PROGRESS NOTE  [name] PJA:[id number] DOB: June 04, 1946   PCP: Baxter Hire, MD  Patient is from: Home.  Lives with her daughters.  Uses braces for ambulation.  DOA: 03/04/2021 LOS: 0  Chief complaints:  Chief Complaint  Patient presents with   Shortness of Breath     Brief Narrative / Interim history: 75 year old F with PMH of poliomyelitis, COPD/chronic hypoxic RF on 3 L, lung cancer s/p palliative radiation, esophageal stricture status post dilation and tobacco use disorder brought to ED after syncopal episode.  Reportedly choked on her coffee and went into a coughing fit before she passed out for few seconds.  She was hypotensive to 80s/60s in ED.  Not tachycardic.  Basic labs without significant finding.  Troponin trended from 21-138.  EKG without acute ischemic finding.  CTA chest negative for PE but patchy GGO opacities in RUL and RML concerning for infectious or inflammatory process, decreased LUL malignancy, new 15 mm lung nodule in LUL, emphysematous changes in the lungs.  Started on IV Zosyn for possible aspiration pneumonia.  TTE ordered.  Subjective: Seen and examined earlier this morning.  No major events overnight of this morning.  She reports pain in her hips, legs and low back.  She says she normally takes ibuprofen for pain at home.  She denies chest pain, shortness of breath, dizziness, GI or UTI symptoms.  Objective: Vitals:   03/05/21 1500 03/05/21 1530 03/05/21 1600 03/05/21 1630  BP: 123/60 92/61 (!) 87/58 95/67  Pulse: 68 62 64 64  Resp: 14 17 18 17   Temp:      TempSrc:      SpO2: 98% 97% 98% 96%  Weight:      Height:       No intake or output data in the 24 hours ending 03/05/21 1655 Filed Weights   03/04/21 1051  Weight: 58.9 kg    Examination:  GENERAL: No apparent distress.  Nontoxic. HEENT: MMM.  Vision and hearing grossly intact.  NECK: Supple.  No apparent JVD.  RESP: 97% on 3 L.  No IWOB.  Fair aeration bilaterally. CVS:  RRR. Heart  sounds normal.  ABD/GI/GU: BS+. Abd soft, NTND.  MSK/EXT:  Moves extremities.  Significant muscular atrophy in BLE from polio. SKIN: no apparent skin lesion or wound NEURO: Awake, alert and oriented appropriately.  Seems to have resting Tremor.  Bilateral lower extremity weakness, 2/5 in LLE and 3/5 in RLE from her polio. PSYCH: Calm. Normal affect.   Procedures:  None  Microbiology summarized: HALPF-79 and influenza PCR nonreactive. Blood cultures NGTD.  Assessment & Plan: Syncopal episode-seems vasovagal from history.  It seems she choked on a coffee and had a coughing fit followed by syncopal episode that lasted few seconds.  No seizure-like activity.  No new focal neuro symptoms.  TTE ordered and pending. -Follow TTE -Resume home metoprolol. -Hold home Lotensin.  Elevated troponin: Likely demand ischemia.  Patient has no chest pain.  EKG reassuring. -Follow TTE -Continue home metoprolol  Aspiration pneumonia?  CT raises concern for RUL and RML infection or inflammation.  She has no fever or leukocytosis.  Procalcitonin negative.  Started on IV Zosyn out of concern for aspiration pneumonia. -De-escalate antibiotic to IV Unasyn  Chronic COPD/chronic hypoxic respiratory failure: On 3 L at baseline. -Continue LABA/LAMA/ICS  History of Lung cancer-completed 5 cycles of radiation therapy in June 2022. -Outpatient follow-up  Poliomyelitis/ambulatory dysfunction-uses leg braces for ambulation at home. -PT/OT eval  Osteoarthritis/chronic pain-reports using ibuprofen intermittently at  home. -Low-dose IV Toradol as needed  Essential hypertension: Normotensive but soft.  Inderal, metoprolol and Lotensin listed in the chart -Resume metoprolol  Urinary incontinence -Resume home Myrbetriq  Mood disorder-stable.  Seems to be taking Depakote, Paxil and Remeron. -Resume home meds.  Body mass index is 25.37 kg/m.         DVT prophylaxis:  SCDs Start: 03/04/21 1837  Code  Status: Full code Family Communication: Updated patient's daughter over the phone. Level of care: Progressive Cardiac Status is: Observation  The patient remains OBS appropriate and will d/c before 2 midnights.  Dispo: The patient is from: Home              Anticipated d/c is to: Home pending TTE              Patient currently is not medically stable to d/c.   Difficult to place patient No       Consultants:  None   Sch Meds:  Scheduled Meds:  fluticasone furoate-vilanterol  1 puff Inhalation Daily   And   umeclidinium bromide  1 puff Inhalation Daily   mirabegron ER  50 mg Oral Daily   Continuous Infusions:  ampicillin-sulbactam (UNASYN) IV 3 g (03/05/21 1501)   PRN Meds:.acetaminophen **OR** acetaminophen, albuterol, ketorolac  Antimicrobials: Anti-infectives (From admission, onward)    Start     Dose/Rate Route Frequency Ordered Stop   03/05/21 1530  Ampicillin-Sulbactam (UNASYN) 3 g in sodium chloride 0.9 % 100 mL IVPB        3 g 200 mL/hr over 30 Minutes Intravenous Every 6 hours 03/05/21 0747     03/04/21 1930  piperacillin-tazobactam (ZOSYN) IVPB 3.375 g  Status:  Discontinued        3.375 g 12.5 mL/hr over 240 Minutes Intravenous Every 8 hours 03/04/21 1929 03/05/21 0747        I have personally reviewed the following labs and images: CBC: Recent Labs  Lab 03/04/21 1049 03/05/21 0730  WBC 5.1 6.7  NEUTROABS  --  5.0  HGB 15.6* 12.4  HCT 46.3* 38.0  MCV 100.4* 101.1*  PLT 131* 117*   BMP &GFR Recent Labs  Lab 03/04/21 1049 03/04/21 1505 03/05/21 0730  NA 136  --  139  K 4.7  --  4.5  CL 101  --  104  CO2 28  --  31  GLUCOSE 184*  --  97  BUN 16  --  20  CREATININE 0.59  --  0.53  CALCIUM 8.4*  --  8.5*  MG  --  2.0 1.8   Estimated Creatinine Clearance: 49.6 mL/min (by C-G formula based on SCr of 0.53 mg/dL). Liver & Pancreas: Recent Labs  Lab 03/04/21 1049 03/05/21 0730  AST 80* 18  ALT 42 23  ALKPHOS 135* 90  BILITOT 0.6  0.8  PROT 6.5 5.6*  ALBUMIN 3.3* 2.8*   No results for input(s): LIPASE, AMYLASE in the last 168 hours. No results for input(s): AMMONIA in the last 168 hours. Diabetic: No results for input(s): HGBA1C in the last 72 hours. No results for input(s): GLUCAP in the last 168 hours. Cardiac Enzymes: Recent Labs  Lab 03/05/21 0730  CKTOTAL 38   No results for input(s): PROBNP in the last 8760 hours. Coagulation Profile: No results for input(s): INR, PROTIME in the last 168 hours. Thyroid Function Tests: No results for input(s): TSH, T4TOTAL, FREET4, T3FREE, THYROIDAB in the last 72 hours. Lipid Profile: No results for input(s): CHOL, HDL, LDLCALC,  TRIG, CHOLHDL, LDLDIRECT in the last 72 hours. Anemia Panel: No results for input(s): VITAMINB12, FOLATE, FERRITIN, TIBC, IRON, RETICCTPCT in the last 72 hours. Urine analysis: No results found for: COLORURINE, APPEARANCEUR, LABSPEC, PHURINE, GLUCOSEU, HGBUR, BILIRUBINUR, KETONESUR, PROTEINUR, UROBILINOGEN, NITRITE, LEUKOCYTESUR Sepsis Labs: Invalid input(s): PROCALCITONIN, [place]  Microbiology: Recent Results (from the past 240 hour(s))  Resp Panel by RT-PCR (Flu A&B, Covid) Nasopharyngeal Swab     Status: None   Collection Time: 03/04/21 11:19 AM   Specimen: Nasopharyngeal Swab; Nasopharyngeal(NP) swabs in vial transport medium  Result Value Ref Range Status   SARS Coronavirus 2 by RT PCR NEGATIVE NEGATIVE Final    Comment: (NOTE) SARS-CoV-2 target nucleic acids are NOT DETECTED.  The SARS-CoV-2 RNA is generally detectable in upper respiratory specimens during the acute phase of infection. The lowest concentration of SARS-CoV-2 viral copies this assay can detect is 138 copies/mL. A negative result does not preclude SARS-Cov-2 infection and should not be used as the sole basis for treatment or other patient management decisions. A negative result may occur with  improper specimen collection/handling, submission of specimen  other than nasopharyngeal swab, presence of viral mutation(s) within the areas targeted by this assay, and inadequate number of viral copies(<138 copies/mL). A negative result must be combined with clinical observations, patient history, and epidemiological information. The expected result is Negative.  Fact Sheet for Patients:  EntrepreneurPulse.com.au  Fact Sheet for Healthcare Providers:  IncredibleEmployment.be  This test is no t yet approved or cleared by the [place] FDA and  has been authorized for detection and/or diagnosis of SARS-CoV-2 by FDA under an Emergency Use Authorization (EUA). This EUA will remain  in effect (meaning this test can be used) for the duration of the COVID-19 declaration under Section 564(b)(1) of the Act, 21 U.S.C.section 360bbb-3(b)(1), unless the authorization is terminated  or revoked sooner.       Influenza A by PCR NEGATIVE NEGATIVE Final   Influenza B by PCR NEGATIVE NEGATIVE Final    Comment: (NOTE) The Xpert Xpress SARS-CoV-2/FLU/RSV plus assay is intended as an aid in the diagnosis of influenza from Nasopharyngeal swab specimens and should not be used as a sole basis for treatment. Nasal washings and aspirates are unacceptable for Xpert Xpress SARS-CoV-2/FLU/RSV testing.  Fact Sheet for Patients: EntrepreneurPulse.com.au  Fact Sheet for Healthcare Providers: IncredibleEmployment.be  This test is not yet approved or cleared by the [place] FDA and has been authorized for detection and/or diagnosis of SARS-CoV-2 by FDA under an Emergency Use Authorization (EUA). This EUA will remain in effect (meaning this test can be used) for the duration of the COVID-19 declaration under Section 564(b)(1) of the Act, 21 U.S.C. section 360bbb-3(b)(1), unless the authorization is terminated or revoked.  Performed at [facility], [address]  [place], [place] [address]   Culture, blood (Routine X 2) w Reflex to ID Panel     Status: None (Preliminary result)   Collection Time: 03/04/21  8:23 PM   Specimen: BLOOD  Result Value Ref Range Status   Specimen Description BLOOD LEFT ANTECUBITAL  Final   Special Requests   Final    BOTTLES DRAWN AEROBIC AND ANAEROBIC Blood Culture adequate volume   Culture   Final    NO GROWTH < 12 HOURS Performed at [facility], [address] [place],  [address]    Report Status PENDING  Incomplete  Culture, blood (Routine X 2) w Reflex to ID Panel     Status: None (Preliminary  result)   Collection Time: 03/04/21  8:23 PM   Specimen: BLOOD  Result Value Ref Range Status   Specimen Description BLOOD RIGHT ANTECUBITAL  Final   Special Requests   Final    BOTTLES DRAWN AEROBIC AND ANAEROBIC Blood Culture adequate volume   Culture   Final    NO GROWTH < 12 HOURS Performed at [facility], [address], [place], [place] [address]    Report Status PENDING  Incomplete    Radiology Studies: CT Angio Chest PE W and/or Wo Contrast  Result Date: 03/04/2021 CLINICAL DATA:  Syncope. Rising troponins. Choking episode. Evaluate for aspiration. Known malignancy. EXAM: CT ANGIOGRAPHY CHEST WITH CONTRAST TECHNIQUE: Multidetector CT imaging of the chest was performed using the standard protocol during bolus administration of intravenous contrast. Multiplanar CT image reconstructions and MIPs were obtained to evaluate the vascular anatomy. CONTRAST:  35mL OMNIPAQUE IOHEXOL 350 MG/ML SOLN COMPARISON:  CT scan of the chest September 28, 2020. FINDINGS: Cardiovascular: Coronary artery disease involves the LAD and circumflex coronary arteries. Heart size is normal. The thoracic aorta is normal in caliber. Atherosclerotic change identified. No dissection identified. No pulmonary emboli. Mediastinum/Nodes: The chest wall is normal. The thyroid is unremarkable. The esophagus is normal. No adenopathy  identified in the chest. No effusions. Lungs/Pleura: Central airways are normal.  No pneumothorax. Patchy ground-glass opacity identified in the right upper lobe and right middle lobe is consistent with an infectious or inflammatory process. Mild dependent atelectasis in the right lower lobe. No new or suspicious nodules/masses in the right lung. Atelectasis is identified medially in the right upper lobe. 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 There is now a band of opacity extending medial to the known malignancy, probably scarring. When this band of opacity is excluded as seen on series 6, image 14, the primary malignancy is smaller in the interval measuring 22 x 15 by 17 mm today versus 30 by 20 by 31 mm previously. Another nodule is identified in the left upper lobe on series 6, image 27 measuring up to 15 mm in greatest dimension, cranial caudal, on today's study. This nodule was not clearly seen previously. A small peripheral nodule seen in the left apex on series 6, image 17 measuring 8 mm in greatest dimension. This nodule does not appear particularly focal for solid on coronal or sagittal images. This may represent atelectasis or post radiation change. No other nodules or masses are identified. Emphysematous changes are seen throughout the lungs. Upper Abdomen: No acute abnormality. Musculoskeletal: No chest wall abnormality. No acute or significant osseous findings. Review of the MIP images confirms the above findings. IMPRESSION: 1. No pulmonary emboli identified. 2. Patchy ground-glass opacities in the right upper and middle lobes consistent with an infectious or inflammatory process. 3. 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 There is now a band of opacity extending medial to the site of the known malignancy. This new band of opacity is favored to represent scarring rather than tumor enlargement. When this band  of opacity is excluded, the primary malignancy is smaller in the interval measuring 2.2 x 1.5 x 1.7 cm today versus 3.0 x 2.0 x 3.1 cm previously. Recommend attention on follow-up. 4. Another nodule is identified in the left upper lobe on series 6, image 27 measuring up to 15 mm in greatest dimension. This nodule is concerning for further malignancy. Recommend attention on follow-up. Alternatively, a  PET-CT could further evaluate. 5. A small peripheral nodule in the left apex measuring up to 8 mm is not particularly solid in appearance on coronal or sagittal imaging and favored represent atelectasis or post treatment change. Recommend attention on follow-up. 6. Emphysematous changes in the lungs. 7. Coronary artery disease. Atherosclerotic change in the thoracic aorta. Aortic Atherosclerosis (ICD10-I70.0) and Emphysema (ICD10-J43.9). Electronically Signed   By: Dorise Bullion III M.D.   On: 03/04/2021 17:38      Heinz Eckert T. Carrizales  If 7PM-7AM, please contact night-coverage [email or web address] 03/05/2021, 4:55 PM

## 2021-03-05 NOTE — Evaluation (Signed)
Occupational Therapy Evaluation Patient Details Name: [name] MRN: [id number] DOB: 1946-02-13 Today's Date: 03/05/2021   History of Present Illness 75 y.o. female with medical history significant for COPD on nocturnal 3 L nasal cannula, chronic tobacco abuse, who is admitted to [facility] on 03/04/2021 s/p aspiration PNA and syncope episode.   Clinical Impression   Patient presenting with decreased Ind in self care, balance, functional mobility/transfers, endurance, and safety awareness. Patient reports being mod I at baseline and lives with 2 adult daughters. Pt is on 3 L at baseline and uses KFO or crutches for mobility. She reports independence in self care tasks with sink bathing. Pt endorses shared task of IADLs in the household but she performs majority of them per her report. Pt performed self care tasks and bed mobility with supervision from ED stretcher. Pt not asked to stand or transfer secondary to not having brace or crutches in room and being on high stretcher. OT asking pt to have family bring them to ED.Patient will benefit from acute OT to increase overall independence in the areas of ADLs, functional mobility, and safety awareness in order to safely discharge home.      Recommendations for follow up therapy are one component of a multi-disciplinary discharge planning process, led by the attending physician.  Recommendations may be updated based on patient status, additional functional criteria and insurance authorization.   Follow Up Recommendations  Home health OT;Supervision - Intermittent    Equipment Recommendations  None recommended by OT       Precautions / Restrictions Precautions Precautions: Fall Restrictions Other Position/Activity Restrictions: Pt reports having polio as a child and wears a KFO at baseline or ambulation with B crutches. She does not have either in ED.      Mobility Bed Mobility Overal bed mobility: Needs Assistance Bed  Mobility: Supine to Sit;Sit to Supine     Supine to sit: Supervision Sit to supine: Supervision   General bed mobility comments: Pt picks up L LE with B UEs to move on and off bed and reports doing this at baseline.    Transfers                 General transfer comment: not performed for safety    Balance Overall balance assessment: Needs assistance Sitting-balance support: Feet unsupported;Single extremity supported Sitting balance-Leahy Scale: Good                                     ADL either performed or assessed with clinical judgement   ADL Overall ADL's : Needs assistance/impaired                                       General ADL Comments: Grooming with set up A. Pt reports feeling close to her baseline. She was on stretcher ED bed and without her equipment needed at baseline for safe mobility and transfers. Bed mobility with close supervision.     Vision Patient Visual Report: No change from baseline              Pertinent Vitals/Pain Pain Assessment: 0-10 Pain Score: 2  Pain Location: back Pain Descriptors / Indicators: Aching;Discomfort Pain Intervention(s): Repositioned;Monitored during session     Hand Dominance Right   Extremity/Trunk Assessment Upper Extremity Assessment Upper Extremity Assessment: Overall [facility]  for tasks assessed;Generalized weakness   Lower Extremity Assessment Lower Extremity Assessment: Overall WFL for tasks assessed;Generalized weakness       Communication Communication Communication: No difficulties   Cognition Arousal/Alertness: Awake/alert Behavior During Therapy: WFL for tasks assessed/performed Overall Cognitive Status: Within Functional Limits for tasks assessed                                 General Comments: Pt is pleasant and cooperative. A & O x4              Home Living Family/patient expects to be discharged to:: Private residence Living  Arrangements: Children Available Help at Discharge: Family;Available PRN/intermittently Type of Home: House Home Access: Stairs to enter [employer] of Steps: 2 Entrance Stairs-Rails: Left Home Layout: One level               Home Equipment: Walker - 2 wheels;Crutches;Bedside commode          Prior Functioning/Environment Level of Independence: Independent with assistive device(s)        Comments: Pt reports living at home with 2 adults daughters. She is mod I with use of AD for safety. She uses KFO at baseline and 3 L O2.        OT Problem List: Decreased strength;Decreased activity tolerance;Decreased knowledge of use of DME or AE;Cardiopulmonary status limiting activity;Impaired balance (sitting and/or standing);Decreased coordination;Decreased safety awareness      OT Treatment/Interventions: Self-care/ADL training;Energy conservation;Modalities;Balance training;Therapeutic exercise;Manual therapy;DME and/or AE instruction;Therapeutic activities;Patient/family education    OT Goals(Current goals can be found in the care plan section) Acute Rehab OT Goals Patient Stated Goal: to return home OT Goal Formulation: With patient Time For Goal Achievement: 03/19/21 Potential to Achieve Goals: Good ADL Goals Pt Will Perform Grooming: with modified independence Pt Will Perform Lower Body Dressing: with modified independence Pt Will Transfer to Toilet: with modified independence Pt Will Perform Toileting - Clothing Manipulation and hygiene: with modified independence  OT Frequency: Min 2X/week   Barriers to D/C: Decreased caregiver support  2 adult children live at home but pt reports they are not very helpful          AM-PAC OT "6 Clicks" Daily Activity     Outcome Measure Help from another person eating meals?: None Help from another person taking care of personal grooming?: None Help from another person toileting, which includes using toliet, bedpan, or  urinal?: A Little Help from another person bathing (including washing, rinsing, drying)?: A Little Help from another person to put on and taking off regular upper body clothing?: None Help from another person to put on and taking off regular lower body clothing?: A Little 6 Click Score: 21   End of Session Equipment Utilized During Treatment: Oxygen (3Ls) Nurse Communication: Mobility status  Activity Tolerance: Patient tolerated treatment well Patient left: in bed;with call bell/phone within reach;with bed alarm set  OT Visit Diagnosis: Unsteadiness on feet (R26.81);Muscle weakness (generalized) (M62.81)                Time: 7654-6503 OT Time Calculation (min): 24 min Charges:  OT General Charges $OT Visit: 1 Visit OT Evaluation $OT Eval Moderate Complexity: 1 Mod OT Treatments $Self Care/Home Management : 8-22 mins  Darleen Crocker, MS, OTR/L , CBIS ascom [phone]  03/05/21, 11:21 AM

## 2021-03-05 NOTE — TOC Initial Note (Signed)
Transition of Care [facility]) - Initial/Assessment Note    Patient Details  Name: [name] MRN: [id number] Date of Birth: 19-Dec-1945  Transition of [facility]) CM/SW Contact:    Anselm Pancoast, RN Phone Number: 03/05/2021, 4:48 PM  Clinical Narrative:                 Met with patient at bedside. Patient reports she lives with her 2 daughters who assist her to appointments but do not have a vehicle. Patient gets financial assistance to pay for transportation from [facility]. Patient is active with [facility]. Patient is very eager to discharge home. Reports she is independent with ADL's and has no current needs or concerns.   Expected Discharge Plan: [place] Barriers to Discharge: Continued Medical Work up   Patient Goals and CMS Choice Patient states their goals for this hospitalization and ongoing recovery are:: Ready to get back home      Expected Discharge Plan and Services Expected Discharge Plan: [place]       Living arrangements for the past 2 months: Single Family Home Expected Discharge Date: 03/05/21                                    Prior Living Arrangements/Services Living arrangements for the past 2 months: Single Family Home Lives with:: Adult Children (2 adult daughters) Patient language and need for interpreter reviewed:: Yes Do you feel safe going back to the place where you live?: Yes      Need for Family Participation in Patient Care: Yes (Comment) Care giver support system in place?: Yes (comment) Current home services: Hospice (Active with [place]) Criminal Activity/Legal Involvement Pertinent to Current Situation/Hospitalization: No - Comment as needed  Activities of Daily Living      Permission Sought/Granted                  Emotional Assessment Appearance:: Appears stated age Attitude/Demeanor/Rapport: Engaged Affect (typically observed): Accepting Orientation: : Oriented  to Self, Oriented to Place, Oriented to  Time, Oriented to Situation Alcohol / Substance Use: Tobacco Use Psych Involvement: No (comment)  Admission diagnosis:  Syncope [R55] Patient Active Problem List   Diagnosis Date Noted   Elevated troponin 03/05/2021   Aspiration pneumonia ([place]) 03/05/2021   COPD (chronic obstructive pulmonary disease) ([place])    Urinary incontinence    Tobacco abuse    Syncope 03/04/2021   PCP:  Baxter Hire, MD Pharmacy:   [facility] ([facility] [place] [address]) [place] [address] Phone: [phone] Fax: [phone]     Social Determinants of Health (SDOH) Interventions    Readmission Risk Interventions No flowsheet data found.

## 2021-03-05 NOTE — ED Notes (Signed)
5am labs drawn by this RN at this time.

## 2021-03-05 NOTE — Evaluation (Signed)
Physical Therapy Evaluation Patient Details Name: [name] MRN: [id number] DOB: 1946/03/23 Today's Date: 03/05/2021  History of Present Illness  75 y.o. female with medical history significant for COPD on nocturnal 3 L nasal cannula, chronic tobacco abuse, who is admitted to [facility] on 03/04/2021 s/p aspiration PNA and syncope episode.  Clinical Impression  Pt seen for PT evaluation with pt agreeable. Provided pt with axillary crutches & pt able to ambulate into hallway with close supervision<>CGA. Pt does require increase from 3L/min to 4L/min during gait 2/2 slight drop in SpO2. Pt negative for orthostatic BP changes. Will continue to follow pt acutely to progress gait & for stair practice prior to d/c.   BP checked in RUE: Supine: 97/62 mmHg (MAP 73) Sitting: 101/78 mmHg (MAP 87) Standing at 0: 126/74 mmHg (MAP 91) Supine at end of session: 118/71 mmHg (MAP 86)        Recommendations for follow up therapy are one component of a multi-disciplinary discharge planning process, led by the attending physician.  Recommendations may be updated based on patient status, additional functional criteria and insurance authorization.  Follow Up Recommendations Supervision - Intermittent    Equipment Recommendations  None recommended by PT    Recommendations for Other Services       Precautions / Restrictions Precautions Precautions: Fall Restrictions Weight Bearing Restrictions: No Other Position/Activity Restrictions: Pt reports having polio as a child and wears a LLE KFO at baseline or ambulation with B crutches. She does not have either in ED.      Mobility  Bed Mobility Overal bed mobility: Needs Assistance Bed Mobility: Supine to Sit;Sit to Supine     Supine to sit: Supervision;HOB elevated Sit to supine: Supervision;HOB elevated        Transfers Overall transfer level: Needs assistance Equipment used: Crutches Transfers: Sit to/from Stand Sit  to Stand: Supervision            Ambulation/Gait Ambulation/Gait assistance: Supervision;Min guard Gait Distance (Feet): 40 Feet Assistive device: Crutches Gait Pattern/deviations: Decreased step length - right;Decreased step length - left;Decreased stride length Gait velocity: decreased   General Gait Details: ambulates with TTWB LLE as pt reports she cannot ambulate with LLE as she has "no muscle in it"  Stairs            Wheelchair Mobility    Modified Rankin (Stroke Patients Only)       Balance Overall balance assessment: Needs assistance Sitting-balance support: Feet supported;Bilateral upper extremity supported Sitting balance-Leahy Scale: Good     Standing balance support: Bilateral upper extremity supported;During functional activity Standing balance-Leahy Scale: Good                               Pertinent Vitals/Pain Pain Assessment: No/denies pain    Home Living Family/patient expects to be discharged to:: Private residence Living Arrangements: Children Available Help at Discharge: Family;Available PRN/intermittently Type of Home: House Home Access: Stairs to enter Entrance Stairs-Rails: None Entrance Stairs-Number of Steps: 2 Home Layout: One level   Additional Comments: LLE KFO    Prior Function Level of Independence: Independent with assistive device(s)         Comments: Pt reports living at home with 2 adults daughters. She is mod I with use of AD for safety. She uses LLE KFO or axillary crutches at baseline and 3 L O2.     Hand Dominance  Extremity/Trunk Assessment   Upper Extremity Assessment Upper Extremity Assessment:  (tremors noted (mostly in head) with pt reporting she's had them for a few years)    Lower Extremity Assessment Lower Extremity Assessment:  (tremors noted (mostly in head) with pt reporting she's had them for a few years; does not ambulate on LLE when using crutches as pt reports "I can't,  I don't have any muscle in it")       Communication   Communication: No difficulties  Cognition Arousal/Alertness: Awake/alert Behavior During Therapy: WFL for tasks assessed/performed Overall Cognitive Status: Within Functional Limits for tasks assessed                                 General Comments: Pt is pleasant and cooperative. A & O x4      General Comments General comments (skin integrity, edema, etc.): Pt on 3L/min at rest, increased to 4L/min as pt drops to 86% with standing    Exercises     Assessment/Plan    PT Assessment Patient needs continued PT services  PT Problem List Decreased strength;Decreased mobility;Decreased balance;Decreased activity tolerance;Cardiopulmonary status limiting activity       PT Treatment Interventions DME instruction;Therapeutic exercise;Balance training;Gait training;Stair training;Neuromuscular re-education;Functional mobility training;Patient/family education;Therapeutic activities    PT Goals (Current goals can be found in the Care Plan section)  Acute Rehab PT Goals Patient Stated Goal: to return home PT Goal Formulation: With patient Time For Goal Achievement: 03/19/21 Potential to Achieve Goals: Good    Frequency Min 2X/week   Barriers to discharge        Co-evaluation               AM-PAC PT "6 Clicks" Mobility  Outcome Measure Help needed turning from your back to your side while in a flat bed without using bedrails?: None Help needed moving from lying on your back to sitting on the side of a flat bed without using bedrails?: A Little Help needed moving to and from a bed to a chair (including a wheelchair)?: A Little Help needed standing up from a chair using your arms (e.g., wheelchair or bedside chair)?: A Little Help needed to walk in hospital room?: A Little Help needed climbing 3-5 steps with a railing? : A Lot 6 Click Score: 18    End of Session Equipment Utilized During Treatment: Gait  belt;Oxygen Activity Tolerance: Patient tolerated treatment well Patient left: in bed;with call bell/phone within reach;with bed alarm set Nurse Communication: Mobility status PT Visit Diagnosis: Unsteadiness on feet (R26.81);Muscle weakness (generalized) (M62.81)    Time: 7867-5449 PT Time Calculation (min) (ACUTE ONLY): 19 min   Charges:   PT Evaluation $PT Eval Low Complexity: 1 Low PT Treatments $Therapeutic Activity: 8-22 mins        Lavone Nian, PT, DPT 03/05/21, 4:11 PM   Waunita Schooner 03/05/2021, 4:09 PM

## 2021-03-06 ENCOUNTER — Observation Stay (HOSPITAL_BASED_OUTPATIENT_CLINIC_OR_DEPARTMENT_OTHER)
Admit: 2021-03-06 | Discharge: 2021-03-06 | Disposition: A | Discharge status: Home / Self Care | Payer: Medicare Other | Attending: Internal Medicine | Admitting: Internal Medicine

## 2021-03-06 DIAGNOSIS — R55 Syncope and collapse: Secondary | ICD-10-CM | POA: Diagnosis not present

## 2021-03-06 DIAGNOSIS — R778 Other specified abnormalities of plasma proteins: Secondary | ICD-10-CM | POA: Diagnosis not present

## 2021-03-06 DIAGNOSIS — J449 Chronic obstructive pulmonary disease, unspecified: Secondary | ICD-10-CM | POA: Diagnosis not present

## 2021-03-06 DIAGNOSIS — I248 Other forms of acute ischemic heart disease: Secondary | ICD-10-CM | POA: Diagnosis not present

## 2021-03-06 DIAGNOSIS — J690 Pneumonitis due to inhalation of food and vomit: Secondary | ICD-10-CM | POA: Diagnosis not present

## 2021-03-06 DIAGNOSIS — R262 Difficulty in walking, not elsewhere classified: Secondary | ICD-10-CM

## 2021-03-06 DIAGNOSIS — Z7189 Other specified counseling: Secondary | ICD-10-CM

## 2021-03-06 LAB — RENAL FUNCTION PANEL
Albumin: 2.4 g/dL — ABNORMAL LOW (ref 3.5–5.0)
Anion gap: 5 (ref 5–15)
BUN: 18 mg/dL (ref 8–23)
CO2: 26 mmol/L (ref 22–32)
Calcium: 7.2 mg/dL — ABNORMAL LOW (ref 8.9–10.3)
Chloride: 108 mmol/L (ref 98–111)
Creatinine, Ser: 0.44 mg/dL (ref 0.44–1.00)
GFR, Estimated: >60 mL/min (ref >60)
Glucose, Bld: 100 mg/dL — ABNORMAL HIGH (ref 70–99)
Phosphorus: 3 mg/dL (ref 2.5–4.6)
Potassium: 3.4 mmol/L — ABNORMAL LOW (ref 3.5–5.1)
Sodium: 139 mmol/L (ref 135–145)

## 2021-03-06 LAB — ECHOCARDIOGRAM COMPLETE
Height: 60 in
Weight: 2078.4 oz

## 2021-03-06 LAB — CBC
HCT: 37.8 % (ref 36.0–46.0)
Hemoglobin: 12.6 g/dL (ref 12.0–15.0)
MCH: 33.6 pg (ref 26.0–34.0)
MCHC: 33.3 g/dL (ref 30.0–36.0)
MCV: 100.8 fL — ABNORMAL HIGH (ref 80.0–100.0)
Platelets: 111 10*3/uL — ABNORMAL LOW (ref 150–400)
RBC: 3.75 MIL/uL — ABNORMAL LOW (ref 3.87–5.11)
RDW: 13.7 % (ref 11.5–15.5)
WBC: 6.7 10*3/uL (ref 4.0–10.5)
nRBC: 0 % (ref 0.0–0.2)

## 2021-03-06 LAB — MAGNESIUM: Magnesium: 1.6 mg/dL — ABNORMAL LOW (ref 1.7–2.4)

## 2021-03-06 MED ORDER — AMOXICILLIN-POT CLAVULANATE 875-125 MG PO TABS: 1.0000 | ORAL_TABLET | Freq: Two times a day (BID) | ORAL | 0 refills | Status: AC

## 2021-03-06 MED ORDER — BENAZEPRIL HCL 20 MG PO TABS: 20.0000 mg | ORAL_TABLET | Freq: Every day | ORAL | Status: AC

## 2021-03-06 MED ORDER — SODIUM CHLORIDE 0.9 % IV BOLUS
500.0000 mL | Freq: Once | INTRAVENOUS | Status: AC
  Administered 2021-03-06: 500 mL via INTRAVENOUS

## 2021-03-06 MED ORDER — POTASSIUM CHLORIDE CRYS ER 20 MEQ PO TBCR
40.0000 meq | EXTENDED_RELEASE_TABLET | Freq: Once | ORAL | Status: AC
  Administered 2021-03-06: 40 meq via ORAL
  Filled 2021-03-06: qty 2

## 2021-03-06 NOTE — ED Notes (Signed)
Patient denies pain and is resting comfortably.  

## 2021-03-06 NOTE — Progress Notes (Signed)
*  PRELIMINARY RESULTS* Echocardiogram 2D Echocardiogram has been performed.  [name] 03/06/2021, 8:52 AM

## 2021-03-06 NOTE — Discharge Summary (Signed)
Physician Discharge Summary  [name] EVO:[id number] DOB: 27-Nov-1945 DOA: 03/04/2021  PCP: Baxter Hire, MD  Admit date: 03/04/2021 Discharge date: 03/06/2021 Admitted From: Home. Disposition: Home Recommendations for Outpatient Follow-up:  Follow ups as below. Please obtain CBC/BMP/Mag at follow up Reassess blood pressure and adjust antihypertensive meds as appropriate Please follow up on the following pending results: None Home Health: HH PT/OT Equipment/Devices: Patient has appropriate DME Discharge Condition: Stable CODE STATUS: DNR/DNI  Follow-up Information     Baxter Hire, MD. Schedule an appointment as soon as possible for a visit .   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  Reportedly choked on her coffee and went into a coughing fit before she passed out for few seconds.  She was hypotensive to 80s/60s in ED.  Not tachycardic.  Basic labs without significant finding.  Troponin trended from 21-138.  EKG without acute ischemic finding.  CTA chest negative for PE but patchy GGO opacities in RUL and RML concerning for infectious or inflammatory process, decreased LUL malignancy, new 15 mm lung nodule in LUL, emphysematous changes in the lungs.  Started on IV Zosyn for possible aspiration pneumonia.  TTE without significant finding.  See details below.  Patient remained stable.  She is discharged on p.o. Augmentin to complete treatment course for possible aspiration pneumonia.  Ordered home health PT/OT.  She has appropriate DME needs.  See individual problem list below for more on hospital course.  Discharge Diagnoses:  Syncopal episode-seems  vasovagal from history.  It seems she choked on a coffee and had a coughing fit followed by syncopal episode that lasted few seconds.  No seizure-like activity.  No new focal neuro symptoms.  TTE without significant finding -Resume home Inderal -Holding home Lotensin until follow-up with PCP.   Elevated troponin: Likely demand ischemia.  Patient has no chest pain.  EKG and TTE reassuring.   Aspiration pneumonia?  CT raises concern for RUL and RML infection or inflammation.  She has no fever or leukocytosis.  Procalcitonin negative.  Started on IV Zosyn out of concern for aspiration pneumonia. -Started on IV Zosyn.  De-escalated to IV Unasyn and discharged on p.o. Augmentin   Chronic COPD/chronic hypoxic respiratory failure: On 3 L at baseline.  Stable. -Continue LABA/LAMA/ICS   History of Lung cancer-completed 5 cycles of palliative radiation therapy in June 2022.  Never had chemo.  Patient is surprised to hear that his CTA showed residual lung nodules from prior malignancy and new lung nodules -Outpatient follow-up with her oncologist   Poliomyelitis/ambulatory dysfunction-uses leg braces for ambulation at home. -HH PT/OT   Osteoarthritis/chronic pain-reports using ibuprofen intermittently at home. -As needed Tylenol   Essential hypertension: Normotensive but soft.  Inderal, metoprolol and Lotensin listed in the chart -Resumed home Inderal -Advised to hold home Lotensin until follow-up with PCP.   Urinary incontinence -Resume home Myrbetriq   Mood disorder-stable.  Seems to be taking Depakote, Paxil and Remeron. -Resume home meds.  Goal of care counseling-patient with comorbidity as above.  Very frail.  Still full code.  We had extensive discussion about risk and benefit of CPR, intubation and he had very little chance of revival  with those aggressive measures if she happens to be in the situation.  I recommended DNR and DNI.  She agreed to that.  CODE STATUS changed to DNR and DNI.   DNR paper completed and signed.  Body mass index is 25.37 kg/m.           Discharge Exam: Vitals:   03/06/21 1200 03/06/21 1501 03/06/21 1600 03/06/21 1700  BP: (!) 148/59 122/84 111/84 120/81  Pulse: 65 72 69 75  Temp:      Resp: 13 17 17  (!) 23  Height:      Weight:      SpO2: 95% 98% 97% 95%  TempSrc:      BMI (Calculated):         GENERAL: No apparent distress.  Nontoxic. HEENT: MMM.  Vision and hearing grossly intact.  NECK: Supple.  No apparent JVD.  RESP: 97% on 3 L at rest.  Fair aeration bilaterally. CVS:  RRR. Heart sounds normal.  ABD/GI/GU: Bowel sounds present. Soft. Non tender.  MSK/EXT: BLE weakness and muscular atrophy from polio. SKIN: no apparent skin lesion or wound NEURO: Awake and alert.  Oriented appropriately.  Seems to have resting her tremor.  BLE weakness, 2/5 in LLE and 3/5 in RLE from polio.  PSYCH: Calm. Normal affect.   Discharge Instructions  Discharge Instructions     Call MD for:  difficulty breathing, headache or visual disturbances   Complete by: As directed    Call MD for:  extreme fatigue   Complete by: As directed    Call MD for:  persistant dizziness or light-headedness   Complete by: As directed    Call MD for:  severe uncontrolled pain   Complete by: As directed    Diet - low sodium heart healthy   Complete by: As directed    Discharge instructions   Complete by: As directed    It has been a pleasure taking care of you!  You were hospitalized with what looks like a syncopal episode likely vasovagal from coughing fit after choking on your coffee.  The test is we have done did not reveal any significant finding about your heart.  There was also concern about possible pneumonia from aspiration.  We started you on IV antibiotics.  We are discharging you on oral antibiotics to complete treatment course.  We suggest you hold your benazepril until you follow-up with your primary care doctor.  Please review your new medication list  and the directions on your medications before you take them.   Take care,   Increase activity slowly   Complete by: As directed       Allergies as of 03/06/2021       Reactions   Prednisone    Severe stomach issues        Medication List     STOP taking these medications    metoprolol tartrate 25 MG tablet Commonly known as: LOPRESSOR       TAKE these medications    albuterol (2.5 MG/3ML) 0.083% nebulizer solution Commonly known as: PROVENTIL Inhale 3 mLs into the lungs every 6 (six) hours as needed for wheezing.   ProAir HFA 108 (90 Base) MCG/ACT inhaler Generic drug: albuterol Inhale 2 puffs into the lungs every 4 (four) hours as needed.   amoxicillin-clavulanate 875-125 MG tablet Commonly known as: Augmentin Take 1 tablet by mouth 2 (two) times daily for 3 days.   benazepril 20 MG tablet Commonly known as: LOTENSIN Take 1 tablet (  20 mg total) by mouth daily. Start taking on: March 13, 2021 What changed: These instructions start on March 13, 2021. If you are unsure what to do until then, ask your doctor or other care provider.   divalproex 250 MG DR tablet Commonly known as: DEPAKOTE Take 250 mg by mouth 2 (two) times daily.   levothyroxine 75 MCG tablet Commonly known as: SYNTHROID Take 75 mcg by mouth every morning.   mirtazapine 7.5 MG tablet Commonly known as: REMERON Take 15 mg by mouth at bedtime.   Myrbetriq 50 MG Tb24 tablet Generic drug: mirabegron ER Take 50 mg by mouth daily.   PARoxetine 20 MG tablet Commonly known as: PAXIL Take 20 mg by mouth every evening.   potassium chloride 10 MEQ tablet Commonly known as: KLOR-CON Take 10 mEq by mouth daily.   propranolol 40 MG tablet Commonly known as: INDERAL Take 40 mg by mouth 2 (two) times daily.   Trelegy Ellipta 100-62.5-25 MCG/INH Aepb Generic drug: Fluticasone-Umeclidin-Vilant Take 1 puff by mouth daily.        Consultations: None  Procedures/Studies:   CT  Angio Chest PE W and/or Wo Contrast  Result Date: 03/04/2021 CLINICAL DATA:  Syncope. Rising troponins. Choking episode. Evaluate for aspiration. Known malignancy. EXAM: CT ANGIOGRAPHY CHEST WITH CONTRAST TECHNIQUE: Multidetector CT imaging of the chest was performed using the standard protocol during bolus administration of intravenous contrast. Multiplanar CT image reconstructions and MIPs were obtained to evaluate the vascular anatomy. CONTRAST:  1mL OMNIPAQUE IOHEXOL 350 MG/ML SOLN COMPARISON:  CT scan of the chest September 28, 2020. FINDINGS: Cardiovascular: Coronary artery disease involves the LAD and circumflex coronary arteries. Heart size is normal. The thoracic aorta is normal in caliber. Atherosclerotic change identified. No dissection identified. No pulmonary emboli. Mediastinum/Nodes: The chest wall is normal. The thyroid is unremarkable. The esophagus is normal. No adenopathy identified in the chest. No effusions. Lungs/Pleura: Central airways are normal.  No pneumothorax. Patchy ground-glass opacity identified in the right upper lobe and right middle lobe is consistent with an infectious or inflammatory process. Mild dependent atelectasis in the right lower lobe. No new or suspicious nodules/masses in the right lung. Atelectasis is identified medially in the right upper lobe. 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 There is now a band of opacity extending medial to the known malignancy, probably scarring. When this band of opacity is excluded as seen on series 6, image 14, the primary malignancy is smaller in the interval measuring 22 x 15 by 17 mm today versus 30 by 20 by 31 mm previously. Another nodule is identified in the left upper lobe on series 6, image 27 measuring up to 15 mm in greatest dimension, cranial caudal, on today's study. This nodule was not clearly seen previously. A small peripheral nodule seen in the left apex on series 6, image 17  measuring 8 mm in greatest dimension. This nodule does not appear particularly focal for solid on coronal or sagittal images. This may represent atelectasis or post radiation change. No other nodules or masses are identified. Emphysematous changes are seen throughout the lungs. Upper Abdomen: No acute abnormality. Musculoskeletal: No chest wall abnormality. No acute or significant osseous findings. Review of the MIP images confirms the above findings. IMPRESSION: 1. No pulmonary emboli identified. 2. Patchy ground-glass opacities in the right upper and middle lobes consistent with an infectious or inflammatory process. 3. The primary malignancy in the left upper lobe has changed appearance  in the interval. There is now a band of opacity extending medial to the site of the known malignancy. This new band of opacity is favored to represent scarring rather than tumor enlargement. When this band of opacity is excluded, the primary malignancy is smaller in the interval measuring 2.2 x 1.5 x 1.7 cm today versus 3.0 x 2.0 x 3.1 cm previously. Recommend attention on follow-up. 4. Another nodule is identified in the left upper lobe on series 6, image 27 measuring up to 15 mm in greatest dimension. This nodule is concerning for further malignancy. Recommend attention on follow-up. Alternatively, a PET-CT could further evaluate. 5. A small peripheral nodule in the left apex measuring up to 8 mm is not particularly solid in appearance on coronal or sagittal imaging and favored represent atelectasis or post treatment change. Recommend attention on follow-up. 6. Emphysematous changes in the lungs. 7. Coronary artery disease. Atherosclerotic change in the thoracic aorta. Aortic Atherosclerosis (ICD10-I70.0) and Emphysema (ICD10-J43.9). Electronically Signed   By: Dorise Bullion III M.D.   On: 03/04/2021 17:38   DG Chest Portable 1 View  Result Date: 03/04/2021 CLINICAL DATA:  Cough, possible aspiration EXAM: PORTABLE CHEST 1  VIEW COMPARISON:  CT chest 09/28/2020 FINDINGS: The heart size and mediastinal contours are within normal limits. A nodule of the left pulmonary apex is not clearly appreciated on this examination, with some irregular opacity in this vicinity. The visualized skeletal structures are unremarkable. IMPRESSION: 1.  No acute abnormality of the lungs. 2. A nodule of the left pulmonary apex is not clearly appreciated on this examination, with some irregular opacity in this vicinity. This may reflect interval treatment response of known malignancy and could be better evaluated by staging CT on a nonemergent basis. Electronically Signed   By: Eddie Candle M.D.   On: 03/04/2021 11:38   ECHOCARDIOGRAM COMPLETE  Result Date: 03/06/2021    ECHOCARDIOGRAM REPORT   Patient Name:   LILLIEN PETRONIO Date of Exam: 03/06/2021 Medical Rec #:  [id number]  Height:       60.0 in Accession #:    [id number] Weight:       129.0 lb Date of Birth:  04/05/1946  BSA:          1.549 m Patient Age:    75 years   BP:           141/68 mmHg Patient Gender: F          HR:           73 bpm. Exam Location:  [facility] Procedure: 2D Echo, Color Doppler and Cardiac Doppler Indications:     Elevated troponin  History:         Patient has no prior history of Echocardiogram examinations.                  COPD. Emphysema.  Sonographer:     Charmayne Sheer Referring Phys:  [id number] Rhetta Mura Diagnosing Phys: Nelva Bush MD  Sonographer Comments: Suboptimal apical window and suboptimal subcostal window. IMPRESSIONS  1. Left ventricular ejection fraction, by estimation, is 55 to 60%. The left ventricle has normal function. Left ventricular endocardial border not optimally defined to evaluate regional wall motion. There is mild left ventricular hypertrophy. Left ventricular diastolic function could not be evaluated.  2. Right ventricular systolic function is normal. The right ventricular size is normal.  3. The mitral valve is degenerative. Trivial mitral valve  regurgitation. No evidence of mitral stenosis. Severe mitral annular calcification.  4. The aortic valve was not well visualized. There is mild thickening of the aortic valve. Aortic valve regurgitation is not visualized. Mild aortic valve sclerosis is present, with no evidence of aortic valve stenosis.  5. The inferior vena cava is normal in size with <50% respiratory variability, suggesting right atrial pressure of 8 mmHg. FINDINGS  Left Ventricle: Left ventricular ejection fraction, by estimation, is 55 to 60%. The left ventricle has normal function. Left ventricular endocardial border not optimally defined to evaluate regional wall motion. The left ventricular internal cavity size was normal in size. There is mild left ventricular hypertrophy. Left ventricular diastolic function could not be evaluated. Right Ventricle: The right ventricular size is normal. No increase in right ventricular wall thickness. Right ventricular systolic function is normal. Left Atrium: Left atrial size was normal in size. Right Atrium: Right atrial size was not well visualized. Pericardium: There is no evidence of pericardial effusion. Mitral Valve: The mitral valve is degenerative in appearance. There is mild thickening of the mitral valve leaflet(s). Severe mitral annular calcification. Trivial mitral valve regurgitation. No evidence of mitral valve stenosis. Tricuspid Valve: The tricuspid valve is grossly normal. Tricuspid valve regurgitation is trivial. Aortic Valve: The aortic valve was not well visualized. There is mild thickening of the aortic valve. Aortic valve regurgitation is not visualized. Mild aortic valve sclerosis is present, with no evidence of aortic valve stenosis. Pulmonic Valve: The pulmonic valve was not well visualized. Pulmonic valve regurgitation is not visualized. No evidence of pulmonic stenosis. Aorta: The aortic root is normal in size and structure. Pulmonary Artery: The pulmonary artery is not well seen.  Venous: The inferior vena cava is normal in size with less than 50% respiratory variability, suggesting right atrial pressure of 8 mmHg. IAS/Shunts: The interatrial septum was not well visualized. Nelva Bush MD Electronically signed by Nelva Bush MD Signature Date/Time: 03/06/2021/4:41:59 PM    Final        The results of significant diagnostics from this hospitalization (including imaging, microbiology, ancillary and laboratory) are listed below for reference.     Microbiology: Recent Results (from the past 240 hour(s))  Resp Panel by RT-PCR (Flu A&B, Covid) Nasopharyngeal Swab     Status: None   Collection Time: 03/04/21 11:19 AM   Specimen: Nasopharyngeal Swab; Nasopharyngeal(NP) swabs in vial transport medium  Result Value Ref Range Status   SARS Coronavirus 2 by RT PCR NEGATIVE NEGATIVE Final    Comment: (NOTE) SARS-CoV-2 target nucleic acids are NOT DETECTED.  The SARS-CoV-2 RNA is generally detectable in upper respiratory specimens during the acute phase of infection. The lowest concentration of SARS-CoV-2 viral copies this assay can detect is 138 copies/mL. A negative result does not preclude SARS-Cov-2 infection and should not be used as the sole basis for treatment or other patient management decisions. A negative result may occur with  improper specimen collection/handling, submission of specimen other than nasopharyngeal swab, presence of viral mutation(s) within the areas targeted by this assay, and inadequate number of viral copies(<138 copies/mL). A negative result must be combined with clinical observations, patient history, and epidemiological information. The expected result is Negative.  Fact Sheet for Patients:  EntrepreneurPulse.com.au  Fact Sheet for Healthcare Providers:  IncredibleEmployment.be  This test is no t yet approved or cleared by the [place] FDA and  has been authorized for detection and/or  diagnosis of SARS-CoV-2 by FDA under an Emergency Use Authorization (EUA). This EUA will remain  in effect (meaning this test can be used)  for the duration of the COVID-19 declaration under Section 564(b)(1) of the Act, 21 U.S.C.section 360bbb-3(b)(1), unless the authorization is terminated  or revoked sooner.       Influenza A by PCR NEGATIVE NEGATIVE Final   Influenza B by PCR NEGATIVE NEGATIVE Final    Comment: (NOTE) The Xpert Xpress SARS-CoV-2/FLU/RSV plus assay is intended as an aid in the diagnosis of influenza from Nasopharyngeal swab specimens and should not be used as a sole basis for treatment. Nasal washings and aspirates are unacceptable for Xpert Xpress SARS-CoV-2/FLU/RSV testing.  Fact Sheet for Patients: EntrepreneurPulse.com.au  Fact Sheet for Healthcare Providers: IncredibleEmployment.be  This test is not yet approved or cleared by the [place] FDA and has been authorized for detection and/or diagnosis of SARS-CoV-2 by FDA under an Emergency Use Authorization (EUA). This EUA will remain in effect (meaning this test can be used) for the duration of the COVID-19 declaration under Section 564(b)(1) of the Act, 21 U.S.C. section 360bbb-3(b)(1), unless the authorization is terminated or revoked.  Performed at [facility], [address] [place], [place] [address]   Culture, blood (Routine X 2) w Reflex to ID Panel     Status: None (Preliminary result)   Collection Time: 03/04/21  8:23 PM   Specimen: BLOOD  Result Value Ref Range Status   Specimen Description BLOOD LEFT ANTECUBITAL  Final   Special Requests   Final    BOTTLES DRAWN AEROBIC AND ANAEROBIC Blood Culture adequate volume   Culture   Final    NO GROWTH 2 DAYS Performed at [facility], [address] [place], [place] [address]    Report Status PENDING  Incomplete  Culture, blood (Routine X 2) w Reflex to ID Panel     Status: None  (Preliminary result)   Collection Time: 03/04/21  8:23 PM   Specimen: BLOOD  Result Value Ref Range Status   Specimen Description BLOOD RIGHT ANTECUBITAL  Final   Special Requests   Final    BOTTLES DRAWN AEROBIC AND ANAEROBIC Blood Culture adequate volume   Culture   Final    NO GROWTH 2 DAYS Performed at [facility], [address] [place], [place] [address]    Report Status PENDING  Incomplete     Labs:  CBC: Recent Labs  Lab 03/04/21 1049 03/05/21 0730 03/06/21 0713  WBC 5.1 6.7 6.7  NEUTROABS  --  5.0  --   HGB 15.6* 12.4 12.6  HCT 46.3* 38.0 37.8  MCV 100.4* 101.1* 100.8*  PLT 131* 117* 111*   BMP &GFR Recent Labs  Lab 03/04/21 1049 03/04/21 1505 03/05/21 0730 03/06/21 0502  NA 136  --  139 139  K 4.7  --  4.5 3.4*  CL 101  --  104 108  CO2 28  --  31 26  GLUCOSE 184*  --  97 100*  BUN 16  --  20 18  CREATININE 0.59  --  0.53 0.44  CALCIUM 8.4*  --  8.5* 7.2*  MG  --  2.0 1.8 1.6*  PHOS  --   --   --  3.0   Estimated Creatinine Clearance: 49.6 mL/min (by C-G formula based on SCr of 0.44 mg/dL). Liver & Pancreas: Recent Labs  Lab 03/04/21 1049 03/05/21 0730 03/06/21 0502  AST 80* 18  --   ALT 42 23  --   ALKPHOS 135* 90  --   BILITOT 0.6 0.8  --   PROT 6.5 5.6*  --  ALBUMIN 3.3* 2.8* 2.4*   No results for input(s): LIPASE, AMYLASE in the last 168 hours. No results for input(s): AMMONIA in the last 168 hours. Diabetic: No results for input(s): HGBA1C in the last 72 hours. No results for input(s): GLUCAP in the last 168 hours. Cardiac Enzymes: Recent Labs  Lab 03/05/21 0730  CKTOTAL 38   No results for input(s): PROBNP in the last 8760 hours. Coagulation Profile: No results for input(s): INR, PROTIME in the last 168 hours. Thyroid Function Tests: No results for input(s): TSH, T4TOTAL, FREET4, T3FREE, THYROIDAB in the last 72 hours. Lipid Profile: No results for input(s): CHOL, HDL, LDLCALC, TRIG, CHOLHDL, LDLDIRECT in the  last 72 hours. Anemia Panel: No results for input(s): VITAMINB12, FOLATE, FERRITIN, TIBC, IRON, RETICCTPCT in the last 72 hours. Urine analysis: No results found for: COLORURINE, APPEARANCEUR, LABSPEC, PHURINE, GLUCOSEU, HGBUR, BILIRUBINUR, KETONESUR, PROTEINUR, UROBILINOGEN, NITRITE, LEUKOCYTESUR Sepsis Labs: Invalid input(s): PROCALCITONIN, LACTICIDVEN   Time coordinating discharge: 45 minutes  SIGNED:  Mercy Riding, MD  [facility] 03/06/2021, 6:16 PM

## 2021-03-06 NOTE — ED Notes (Signed)
Pt eating from breakfast tray at this time.

## 2021-03-06 NOTE — ED Notes (Signed)
This RN called this pt's daughter, [name], at this time at the request of this pt. This RN informed aforementioned individual that this pt is up for discharge and ready to go home once someone can pick her up. Aforementioned individual stated that she would work on securing a ride for this pt.

## 2021-03-07 ENCOUNTER — Other Ambulatory Visit: Payer: Self-pay | Admitting: Internal Medicine

## 2021-03-07 ENCOUNTER — Encounter: Payer: Self-pay | Admitting: Radiation Oncology

## 2021-03-07 ENCOUNTER — Telehealth: Payer: Self-pay | Admitting: Internal Medicine

## 2021-03-07 DIAGNOSIS — C3412 Malignant neoplasm of upper lobe, left bronchus or lung: Secondary | ICD-10-CM

## 2021-03-07 DIAGNOSIS — R911 Solitary pulmonary nodule: Secondary | ICD-10-CM

## 2021-03-07 NOTE — Telephone Encounter (Signed)
See separate md phone note.

## 2021-03-07 NOTE — Progress Notes (Signed)
Ordered PET scan in 2 weeks  Follow-up MD-2 weeks; labs CBC CMP; PET scan prior.

## 2021-03-07 NOTE — Addendum Note (Signed)
Addended by: Gloris Ham on: 03/07/2021 04:08 PM   Modules accepted: Orders

## 2021-03-07 NOTE — Telephone Encounter (Signed)
Dr. B Pt had abn ct scan in ER-    Review of the MIP images confirms the above findings.   IMPRESSION: 1. No pulmonary emboli identified. 2. Patchy ground-glass opacities in the right upper and middle lobes consistent with an infectious or inflammatory process. 3. 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 There is now a band of opacity extending medial to the site of the known malignancy. This new band of opacity is favored to represent scarring rather than tumor enlargement. When this band of opacity is excluded, the primary malignancy is smaller in the interval measuring 2.2 x 1.5 x 1.7 cm today versus 3.0 x 2.0 x 3.1 cm previously. Recommend attention on follow-up. 4. Another nodule is identified in the left upper lobe on series 6, image 27 measuring up to 15 mm in greatest dimension. This nodule is concerning for further malignancy. Recommend attention on follow-up. Alternatively, a PET-CT could further evaluate. 5. A small peripheral nodule in the left apex measuring up to 8 mm is not particularly solid in appearance on coronal or sagittal imaging and favored represent atelectasis or post treatment change. Recommend attention on follow-up. 6. Emphysematous changes in the lungs. 7. Coronary artery disease. Atherosclerotic change in the thoracic aorta.

## 2021-03-07 NOTE — Progress Notes (Signed)
Spoke with daughter- plan of care reviewed with daughter. Family agreeable to have pet scan. Lab/ md 1-2 days after the scan  Daughter prefers apts Monday- Thursday only after 11 am if possible. Daughter aware that we will reach back out to them when pet scan is approved by pt's insurance.

## 2021-03-07 NOTE — Telephone Encounter (Signed)
Pt daughter is requesting a Hospital Follow-up for her mother. Pt was in the ED for 3 days and she was told that Dr. B was coming to evaluate the patient in the ED, but he never came. Please advise on follow-up for pt.

## 2021-03-08 NOTE — Progress Notes (Signed)
Pt is aware of plan and got all details for appts

## 2021-03-09 LAB — CULTURE, BLOOD (ROUTINE X 2)
Culture: NO GROWTH
Culture: NO GROWTH
Special Requests: ADEQUATE
Special Requests: ADEQUATE

## 2021-03-13 ENCOUNTER — Other Ambulatory Visit: Payer: Self-pay

## 2021-03-13 ENCOUNTER — Other Ambulatory Visit: Payer: Medicare Other | Admitting: Primary Care

## 2021-03-14 ENCOUNTER — Other Ambulatory Visit: Payer: Self-pay

## 2021-03-14 ENCOUNTER — Ambulatory Visit
Admission: RE | Admit: 2021-03-14 | Discharge: 2021-03-14 | Disposition: A | Discharge status: Home / Self Care | Payer: Medicare Other | Source: Ambulatory Visit | Attending: Internal Medicine | Admitting: Internal Medicine

## 2021-03-14 DIAGNOSIS — R911 Solitary pulmonary nodule: Secondary | ICD-10-CM | POA: Diagnosis not present

## 2021-03-14 DIAGNOSIS — I2510 Atherosclerotic heart disease of native coronary artery without angina pectoris: Secondary | ICD-10-CM | POA: Diagnosis not present

## 2021-03-14 DIAGNOSIS — J439 Emphysema, unspecified: Secondary | ICD-10-CM | POA: Diagnosis not present

## 2021-03-14 DIAGNOSIS — C3412 Malignant neoplasm of upper lobe, left bronchus or lung: Secondary | ICD-10-CM | POA: Insufficient documentation

## 2021-03-14 DIAGNOSIS — K5730 Diverticulosis of large intestine without perforation or abscess without bleeding: Secondary | ICD-10-CM | POA: Diagnosis not present

## 2021-03-14 DIAGNOSIS — G14 Postpolio syndrome: Secondary | ICD-10-CM | POA: Insufficient documentation

## 2021-03-14 DIAGNOSIS — Q6589 Other specified congenital deformities of hip: Secondary | ICD-10-CM | POA: Diagnosis not present

## 2021-03-14 DIAGNOSIS — I700 Atherosclerosis of aorta: Secondary | ICD-10-CM | POA: Insufficient documentation

## 2021-03-14 LAB — GLUCOSE, CAPILLARY: Glucose-Capillary: 92 mg/dL (ref 70–99)

## 2021-03-14 MED ORDER — FLUDEOXYGLUCOSE F - 18 (FDG) INJECTION
6.1000 | Freq: Once | INTRAVENOUS | Status: AC
  Administered 2021-03-14: 6.33 via INTRAVENOUS

## 2021-03-15 ENCOUNTER — Other Ambulatory Visit: Payer: Medicare Other | Admitting: Primary Care

## 2021-03-20 ENCOUNTER — Inpatient Hospital Stay: Payer: Medicare Other | Attending: Internal Medicine | Admitting: Internal Medicine

## 2021-03-20 DIAGNOSIS — Z7951 Long term (current) use of inhaled steroids: Secondary | ICD-10-CM | POA: Insufficient documentation

## 2021-03-20 DIAGNOSIS — Z7982 Long term (current) use of aspirin: Secondary | ICD-10-CM | POA: Insufficient documentation

## 2021-03-20 DIAGNOSIS — Z7952 Long term (current) use of systemic steroids: Secondary | ICD-10-CM | POA: Diagnosis not present

## 2021-03-20 DIAGNOSIS — F17210 Nicotine dependence, cigarettes, uncomplicated: Secondary | ICD-10-CM | POA: Insufficient documentation

## 2021-03-20 DIAGNOSIS — E119 Type 2 diabetes mellitus without complications: Secondary | ICD-10-CM | POA: Diagnosis not present

## 2021-03-20 DIAGNOSIS — Z79899 Other long term (current) drug therapy: Secondary | ICD-10-CM | POA: Insufficient documentation

## 2021-03-20 DIAGNOSIS — Z923 Personal history of irradiation: Secondary | ICD-10-CM | POA: Insufficient documentation

## 2021-03-20 DIAGNOSIS — C3412 Malignant neoplasm of upper lobe, left bronchus or lung: Secondary | ICD-10-CM | POA: Diagnosis not present

## 2021-03-20 DIAGNOSIS — I119 Hypertensive heart disease without heart failure: Secondary | ICD-10-CM | POA: Diagnosis not present

## 2021-03-20 DIAGNOSIS — Z8571 Personal history of Hodgkin lymphoma: Secondary | ICD-10-CM | POA: Diagnosis not present

## 2021-03-20 DIAGNOSIS — D751 Secondary polycythemia: Secondary | ICD-10-CM | POA: Insufficient documentation

## 2021-03-20 NOTE — Assessment & Plan Note (Addendum)
#  Left upper lobe lung nodule-3.1 cm left apex-highly concerning for malignancy; along with borderline increase in size of the mediastinal lymph nodes. April 28th, 2022- PET scan-left upper lobe nodule suggestive of malignancy; equivocal right paratracheal lymph nodes-s/p SBRT [finished early June 2022]. SEP 29th, 2022- Substantially reduced activity in the left apical mass, with a new hypermetabolic left upper lobe peribronchovascular pulmonary nodule measuring 1.3 cm in diameter with maximum SUV of 6.9, compatible with malignancy.  #I reviewed my concerns for new left upper lobe lung nodule-as noted above on PET scan again concerning for malignancy.  Discussed option of SBRT.  Patient poor candidate for any systemic therapy.  I reviewed the natural history of progressive malignancy-need for symptomatic treatment/pain control.   # Patient states that she is "tired"; and is not interested in any seeking any further therapies.  She wants to cancel her appointment with Dr. Donella Stade.  I encouraged her to call the office to let them know of the patient's plan.  #History of Hodgkin's disease -active smoker/discussed smoking cessation especially with bleomycin-patient not interested quitting smoking.  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  Continue to monitor closely.  Given the incurable nature of the disease /poor tolerance of therapy and in general less than 6 months of life expectancy-I introduced hospice philosophy to the patient and family.  Discussed that goal of care should be directed to symptom management rather than treating the underlying disease; and in the process help improve quality of life rather than quantity.  Discussed with hospice team would include-nurse, nurse aide, social worker and chaplain for help take care of patient with physical/emotional needs. Patient currently palliative care at home.  Will discuss with Ralene Bathe patient's  palliative care nurse practitioner regarding patient decisions/and patient will be transition to hospice when felt clinically appropriate.  # DISPOSITION: # follow up as needed-Dr.B  # 40 minutes face-to-face with the patient discussing the above plan of care; more than 50% of time spent on prognosis/ natural history; counseling and coordination.  # I reviewed the blood work- with the patient in detail; also reviewed the imaging independently [as summarized above]; and with the patient in detail.    Cc; hayley

## 2021-03-20 NOTE — Progress Notes (Signed)
[name] NOTE  Patient Care Team: Jennifer Hire, MD as PCP - General (Internal Medicine) Jennifer Julian Jonette Eva, NP as Nurse Practitioner (Hospice and Palliative Medicine) Jennifer Sickle, MD as Referring Physician (Internal Medicine) Jennifer Hire, MD (Internal Medicine)  CHIEF COMPLAINTS/PURPOSE OF CONSULTATION: Lung   #  Oncology History Overview Note  # NOV 2011- HODGKIN'S' LYMPHOMA- STAGE II s/p ABVD x4; s/p IFRT [finished June 2012]  # April 2022-CT scan chest ~3cm left upper lobe apex lung nodule; borderline mediastinal apathy; right posterior medial infiltrate  # SECONDARY ERYTHROCYTOSIS from smoking  # active smoker/LCSP [april2019]  # Post polio Left LE paralysis; Hx of seizures/tremors  DIAGNOSIS  STAGE:        ;GOALS:   CURRENT/MOST RECENT THERAPY:     Hodgkin's disease of head, face, and neck ([name])     HISTORY OF PRESENTING ILLNESS: Accompanied by daughter.  She is in a wheelchair.  [name] 75 y.o.  female with multiple medical problems including advanced COPD on home O2; tremors; prior history of Hodgkin's disease; active smoker left upper lobe lung nodule/suspected malignancy currently s/p radiation is here for follow-up/review results of the posttreatment PET scan.  Patient was recently evaluated in the emergency room for difficulty breathing.  Patient is currently followed by palliative care nurse at home.  Patient continues to have chronic shortness of breath.  Chronic fatigue.  No headaches.  No hemoptysis.  Review of Systems  Constitutional:  Positive for malaise/fatigue and weight loss. Negative for chills, diaphoresis and fever.  HENT:  Negative for nosebleeds and sore throat.   Eyes:  Negative for double vision.  Respiratory:  Positive for cough and shortness of breath. Negative for sputum production and wheezing.   Cardiovascular:  Negative for chest pain, palpitations, orthopnea and leg swelling.   Gastrointestinal:  Negative for abdominal pain, blood in stool, constipation, diarrhea, heartburn, melena, nausea and vomiting.  Genitourinary:  Negative for dysuria, frequency and urgency.  Musculoskeletal:  Negative for back pain and joint pain.  Skin: Negative.  Negative for itching and rash.  Neurological:  Positive for dizziness and tremors. Negative for tingling, focal weakness, weakness and headaches.  Endo/Heme/Allergies:  Does not bruise/bleed easily.  Psychiatric/Behavioral:  Negative for depression. The patient is not nervous/anxious and does not have insomnia.     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[place])    Postpolio syndrome    left leg paralysis    SURGICAL HISTORY: Past Surgical History:  Procedure Laterality Date   ABDOMINAL HYSTERECTOMY     BLADDER SURGERY     BREAST LUMPECTOMY     CARPAL TUNNEL RELEASE  2004   Bilateral   CHOLECYSTECTOMY     ESOPHAGEAL DILATION     around 2018   RECTAL PROLAPSE REPAIR  April 2010    SOCIAL HISTORY: Social History   Socioeconomic History   Marital status: Widowed    Spouse name: Not on file   Number of children: Not on file   Years of education: Not on file  Highest education level: Not on file  Occupational History   Not on file  Tobacco Use   Smoking status: Every Day    Packs/day: 0.50    Years: 57.00    Pack years: 28.50    Types: Cigarettes   Smokeless tobacco: Never   Tobacco comments:    quit smoking 11/13/15  Substance and Sexual Activity   Alcohol use: Never   Drug use: Never   Sexual activity: Not on file  Other Topics Concern    Not on file  Social History Narrative   ** Merged History Encounter **       Social Determinants of Health   Financial Resource Strain: Not on file  Food Insecurity: Not on file  Transportation Needs: Not on file  Physical Activity: Not on file  Stress: Not on file  Social Connections: Not on file  Intimate Partner Violence: Not on file    FAMILY HISTORY: Family History  Problem Relation Age of Onset   Cancer Father    Cancer Paternal Grandmother    Diabetes Mother    Emphysema Mother     ALLERGIES:  is allergic to prednisone and trazodone.  MEDICATIONS:  Current Outpatient Medications  Medication Sig Dispense Refill   albuterol (PROVENTIL HFA;VENTOLIN HFA) 108 (90 Base) MCG/ACT inhaler Inhale 2 puffs into the lungs every 4 (four) hours as needed for wheezing or shortness of breath.     albuterol (PROVENTIL) (2.5 MG/3ML) 0.083% nebulizer solution Inhale 3 mLs into the lungs every 6 (six) hours as needed for wheezing.     benazepril (LOTENSIN) 20 MG tablet Take 1 tablet (20 mg total) by mouth daily.     benzonatate (TESSALON) 200 MG capsule Take 1 capsule by mouth in the morning, at noon, and at bedtime. cough     clonazePAM (KLONOPIN) 0.5 MG tablet Take 1 tablet (0.5 mg total) by mouth 2 (two) times daily as needed for anxiety. 15 tablet 0   divalproex (DEPAKOTE) 250 MG DR tablet Take 250 mg by mouth 2 (two) times daily.      divalproex (DEPAKOTE) 250 MG DR tablet Take 250 mg by mouth 2 (two) times daily.     guaiFENesin (MUCINEX) 600 MG 12 hr tablet Take 600 mg by mouth 2 (two) times daily.     ipratropium-albuterol (DUONEB) 0.5-2.5 (3) MG/3ML SOLN Take 3 mLs by nebulization every 4 (four) hours as needed. 360 mL    levothyroxine (SYNTHROID) 75 MCG tablet Take 75 mcg by mouth every morning.     levothyroxine (SYNTHROID, LEVOTHROID) 75 MCG tablet Take 75 mcg by mouth daily before breakfast.      losartan (COZAAR) 100 MG tablet Take 100 mg by mouth daily.      metoprolol  tartrate (LOPRESSOR) 25 MG tablet Take 0.5 tablets (12.5 mg total) by mouth 2 (two) times daily. 60 tablet 0   mirabegron ER (MYRBETRIQ) 50 MG TB24 tablet Take 50 mg by mouth at bedtime.      mirtazapine (REMERON) 7.5 MG tablet Take 7.5 mg by mouth at bedtime. 1-2 nightly at bedtime     mirtazapine (REMERON) 7.5 MG tablet Take 15 mg by mouth at bedtime.     MYRBETRIQ 50 MG TB24 tablet Take 50 mg by mouth daily.     OXYGEN Inhale 3 L into the lungs continuous.     PARoxetine (PAXIL) 20 MG tablet Take 20 mg by mouth daily.      PARoxetine (PAXIL) 20 MG tablet Take 20 mg by  mouth every evening.     potassium chloride (KLOR-CON) 10 MEQ tablet Take 10 mEq by mouth daily.     PROAIR HFA 108 (90 Base) MCG/ACT inhaler Inhale 2 puffs into the lungs every 4 (four) hours as needed.     propranolol (INDERAL) 40 MG tablet Take 40 mg by mouth 2 (two) times daily.     senna-docusate (SENOKOT-S) 8.6-50 MG tablet Take 2 tablets by mouth daily as needed.     TRELEGY ELLIPTA 100-62.5-25 MCG/INH AEPB INHALE 1 PUFF INTO LUNGS DAILY 60 each 0   TRELEGY ELLIPTA 100-62.5-25 MCG/INH AEPB Take 1 puff by mouth daily.     aspirin EC 81 MG tablet Take 81 mg by mouth daily. (Patient not taking: No sig reported)     b complex vitamins tablet Take 1 tablet by mouth daily. (Patient not taking: No sig reported)     cholecalciferol (VITAMIN D) 1000 units tablet Take 1,000 Units by mouth daily. (Patient not taking: No sig reported)     Fluticasone-Salmeterol (ADVAIR DISKUS) 250-50 MCG/DOSE AEPB Inhale 1 puff into the lungs 2 (two) times daily. (Patient not taking: No sig reported) 60 each    nicotine polacrilex (COMMIT) 2 MG lozenge Take 1 lozenge (2 mg total) by mouth as needed for smoking cessation. (Patient not taking: No sig reported) 81 tablet 10   predniSONE (DELTASONE) 20 MG tablet 3qam for 5 days, 2qam for 5 days, 1qam for 5 days (Patient not taking: No sig reported)     No current facility-administered medications for  this visit.      Marland Kitchen  PHYSICAL EXAMINATION: ECOG PERFORMANCE STATUS: 3 - Symptomatic, >50% confined to bed  Vitals:   03/20/21 1536  BP: 137/67  Pulse: 67  Resp: 18  Temp: 98.2 F (36.8 C)  SpO2: 95%   Filed Weights   03/20/21 1534  Weight: 126 lb (57.2 kg)    Physical Exam Constitutional:      Comments: [place] Caucasian female patient she is in a wheelchair.  Accompanied by her daughter.  HENT:     Head: Normocephalic and atraumatic.     Mouth/Throat:     Pharynx: No oropharyngeal exudate.  Eyes:     Pupils: Pupils are equal, round, and reactive to light.  Cardiovascular:     Rate and Rhythm: Normal rate and regular rhythm.  Pulmonary:     Effort: No respiratory distress.     Breath sounds: No wheezing.     Comments: Decreased air entry bilaterally. Abdominal:     General: Bowel sounds are normal. There is no distension.     Palpations: Abdomen is soft. There is no mass.     Tenderness: There is no abdominal tenderness. There is no guarding or rebound.  Musculoskeletal:        General: No tenderness. Normal range of motion.     Cervical back: Normal range of motion and neck supple.  Skin:    General: Skin is warm.  Neurological:     Mental Status: She is alert and oriented to person, place, and time.     Comments: Tremors of the head upper extremities.;  Chronic weakness of the left lower extremity [post polio paralysis]  Psychiatric:        Mood and Affect: Affect normal.     LABORATORY DATA:  I have reviewed the data as listed Lab Results  Component Value Date   WBC 6.7 03/06/2021   HGB 12.6 03/06/2021   HCT 37.8 03/06/2021  MCV 100.8 (H) 03/06/2021   PLT 111 (L) 03/06/2021   Recent Labs    03/04/21 1049 03/05/21 0730 03/06/21 0502  NA 136 139 139  K 4.7 4.5 3.4*  CL 101 104 108  CO2 28 31 26   GLUCOSE 184* 97 100*  BUN 16 20 18   CREATININE 0.59 0.53 0.44  CALCIUM 8.4* 8.5* 7.2*  GFRNONAA >60 >60 >60  PROT 6.5 5.6*  --    ALBUMIN 3.3* 2.8* 2.4*  AST 80* 18  --   ALT 42 23  --   ALKPHOS 135* 90  --   BILITOT 0.6 0.8  --     RADIOGRAPHIC STUDIES: I have personally reviewed the radiological images as listed and agreed with the findings in the report. CT Angio Chest PE W and/or Wo Contrast  Result Date: 03/04/2021 CLINICAL DATA:  Syncope. Rising troponins. Choking episode. Evaluate for aspiration. Known malignancy. EXAM: CT ANGIOGRAPHY CHEST WITH CONTRAST TECHNIQUE: Multidetector CT imaging of the chest was performed using the standard protocol during bolus administration of intravenous contrast. Multiplanar CT image reconstructions and MIPs were obtained to evaluate the vascular anatomy. CONTRAST:  52mL OMNIPAQUE IOHEXOL 350 MG/ML SOLN COMPARISON:  CT scan of the chest September 28, 2020. FINDINGS: Cardiovascular: Coronary artery disease involves the LAD and circumflex coronary arteries. Heart size is normal. The thoracic aorta is normal in caliber. Atherosclerotic change identified. No dissection identified. No pulmonary emboli. Mediastinum/Nodes: The chest wall is normal. The thyroid is unremarkable. The esophagus is normal. No adenopathy identified in the chest. No effusions. Lungs/Pleura: Central airways are normal.  No pneumothorax. Patchy ground-glass opacity identified in the right upper lobe and right middle lobe is consistent with an infectious or inflammatory process. Mild dependent atelectasis in the right lower lobe. No new or suspicious nodules/masses in the right lung. Atelectasis is identified medially in the right upper lobe. 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 There is now a band of opacity extending medial to the known malignancy, probably scarring. When this band of opacity is excluded as seen on series 6, image 14, the primary malignancy is smaller in the interval measuring 22 x 15 by 17 mm today versus 30 by 20 by 31 mm previously. Another nodule is  identified in the left upper lobe on series 6, image 27 measuring up to 15 mm in greatest dimension, cranial caudal, on today's study. This nodule was not clearly seen previously. A small peripheral nodule seen in the left apex on series 6, image 17 measuring 8 mm in greatest dimension. This nodule does not appear particularly focal for solid on coronal or sagittal images. This may represent atelectasis or post radiation change. No other nodules or masses are identified. Emphysematous changes are seen throughout the lungs. Upper Abdomen: No acute abnormality. Musculoskeletal: No chest wall abnormality. No acute or significant osseous findings. Review of the MIP images confirms the above findings. IMPRESSION: 1. No pulmonary emboli identified. 2. Patchy ground-glass opacities in the right upper and middle lobes consistent with an infectious or inflammatory process. 3. 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 There is now a band of opacity extending medial to the site of the known malignancy. This new band of opacity is favored to represent scarring rather than tumor enlargement. When this band of opacity is excluded, the primary malignancy is smaller in the interval measuring 2.2 x 1.5 x 1.7 cm today versus 3.0 x 2.0  x 3.1 cm previously. Recommend attention on follow-up. 4. Another nodule is identified in the left upper lobe on series 6, image 27 measuring up to 15 mm in greatest dimension. This nodule is concerning for further malignancy. Recommend attention on follow-up. Alternatively, a PET-CT could further evaluate. 5. A small peripheral nodule in the left apex measuring up to 8 mm is not particularly solid in appearance on coronal or sagittal imaging and favored represent atelectasis or post treatment change. Recommend attention on follow-up. 6. Emphysematous changes in the lungs. 7. Coronary artery disease. Atherosclerotic change in the thoracic aorta. Aortic  Atherosclerosis (ICD10-I70.0) and Emphysema (ICD10-J43.9). Electronically Signed   By: Dorise Bullion III M.D.   On: 03/04/2021 17:38   NM PET Image Restage (PS) Skull Base to Thigh (F-18 FDG)  Result Date: 03/15/2021 CLINICAL DATA:  Subsequent treatment strategy for lung nodule. Previous left apical nodule, along with a new left upper lobe nodule on recent CT. EXAM: NUCLEAR MEDICINE PET SKULL BASE TO THIGH TECHNIQUE: 6.3 mCi F-18 FDG was injected intravenously. Full-ring PET imaging was performed from the skull base to thigh after the radiotracer. CT data was obtained and used for attenuation correction and anatomic localization. Fasting blood glucose: 92  mg/dl COMPARISON:  Multiple exams, including 10/12/2020 and CT chest from 03/04/2021 FINDINGS: Mediastinal blood pool activity: SUV max 1.8 Liver activity: SUV max NA NECK: No significant abnormal hypermetabolic activity in this region. Incidental CT findings: Periventricular white matter hypodensities favoring chronic ischemic microvascular white matter disease intracranially. Bilateral common carotid atherosclerotic calcification. CHEST: Reduced activity of the left apical mass/nodule, maximum SUV 2.1, previously 6.6. A new peribronchovascular nodule in the left upper lobe measuring 1.3 by 1.0 cm on image 64 series 3 has maximum SUV of 6.9, compatible with malignancy. The sub solid peripheral left apical nodule discussed on recent chest CT measuring about 0.6 cm in diameter is not appreciably hypermetabolic but is below sensitive PET-CT size thresholds and may merit surveillance. Incidental CT findings: Right apical pleuroparenchymal scarring. Centrilobular emphysema. Airway thickening especially in the left upper lobe potentially with some airway plugging. Coronary, aortic arch, and branch vessel atherosclerotic vascular disease. ABDOMEN/PELVIS: Minimal chronic fullness of the left adrenal gland unchanged from 07/02/2013, maximum SUV 3.3, likely  incidental. Incidental CT findings: Markedly severe fatty atrophy of the left iloipsoas, left piriformis, left gluteal, and left upper thigh musculature, as can be seen with post polio syndrome. Cholecystectomy. Atherosclerosis is present, including aortoiliac atherosclerotic disease. Sigmoid colon diverticulosis. SKELETON: No significant abnormal hypermetabolic activity in this region. Incidental CT findings: Dysplastic findings in the left hip. IMPRESSION: 1. Substantially reduced activity in the left apical mass, with a new hypermetabolic left upper lobe peribronchovascular pulmonary nodule measuring 1.3 cm in diameter with maximum SUV of 6.9, compatible with malignancy. 2. Other imaging findings of potential clinical significance: Aortic Atherosclerosis (ICD10-I70.0) and Emphysema (ICD10-J43.9). Coronary atherosclerosis. Severe fatty atrophy of the musculature of the left pelvis and upper thigh, potentially related to the patient's reported post-polio syndrome. Dysplastic findings in the left hip. Sigmoid colon diverticulosis. Electronically Signed   By: Van Clines M.D.   On: 03/15/2021 11:49   DG Chest Portable 1 View  Result Date: 03/04/2021 CLINICAL DATA:  Cough, possible aspiration EXAM: PORTABLE CHEST 1 VIEW COMPARISON:  CT chest 09/28/2020 FINDINGS: The heart size and mediastinal contours are within normal limits. A nodule of the left pulmonary apex is not clearly appreciated on this examination, with some irregular opacity in this vicinity. The visualized skeletal structures  are unremarkable. IMPRESSION: 1.  No acute abnormality of the lungs. 2. A nodule of the left pulmonary apex is not clearly appreciated on this examination, with some irregular opacity in this vicinity. This may reflect interval treatment response of known malignancy and could be better evaluated by staging CT on a nonemergent basis. Electronically Signed   By: Eddie Candle M.D.   On: 03/04/2021 11:38   ECHOCARDIOGRAM  COMPLETE  Result Date: 03/06/2021    ECHOCARDIOGRAM REPORT   Patient Name:   [name] Date of Exam: 03/06/2021 Medical Rec #:  [id number]  Height:       60.0 in Accession #:    [id number] Weight:       129.0 lb Date of Birth:  1946-02-21  BSA:          1.549 m Patient Age:    74 years   BP:           141/68 mmHg Patient Gender: F          HR:           73 bpm. Exam Location:  [facility] Procedure: 2D Echo, Color Doppler and Cardiac Doppler Indications:     Elevated troponin  History:         Patient has no prior history of Echocardiogram examinations.                  COPD. Emphysema.  Sonographer:     Charmayne Sheer Referring Phys:  [id number] Rhetta Mura Diagnosing Phys: Nelva Bush MD  Sonographer Comments: Suboptimal apical window and suboptimal subcostal window. IMPRESSIONS  1. Left ventricular ejection fraction, by estimation, is 55 to 60%. The left ventricle has normal function. Left ventricular endocardial border not optimally defined to evaluate regional wall motion. There is mild left ventricular hypertrophy. Left ventricular diastolic function could not be evaluated.  2. Right ventricular systolic function is normal. The right ventricular size is normal.  3. The mitral valve is degenerative. Trivial mitral valve regurgitation. No evidence of mitral stenosis. Severe mitral annular calcification.  4. The aortic valve was not well visualized. There is mild thickening of the aortic valve. Aortic valve regurgitation is not visualized. Mild aortic valve sclerosis is present, with no evidence of aortic valve stenosis.  5. The inferior vena cava is normal in size with <50% respiratory variability, suggesting right atrial pressure of 8 mmHg. FINDINGS  Left Ventricle: Left ventricular ejection fraction, by estimation, is 55 to 60%. The left ventricle has normal function. Left ventricular endocardial border not optimally defined to evaluate regional wall motion. The left ventricular internal cavity size was normal in  size. There is mild left ventricular hypertrophy. Left ventricular diastolic function could not be evaluated. Right Ventricle: The right ventricular size is normal. No increase in right ventricular wall thickness. Right ventricular systolic function is normal. Left Atrium: Left atrial size was normal in size. Right Atrium: Right atrial size was not well visualized. Pericardium: There is no evidence of pericardial effusion. Mitral Valve: The mitral valve is degenerative in appearance. There is mild thickening of the mitral valve leaflet(s). Severe mitral annular calcification. Trivial mitral valve regurgitation. No evidence of mitral valve stenosis. Tricuspid Valve: The tricuspid valve is grossly normal. Tricuspid valve regurgitation is trivial. Aortic Valve: The aortic valve was not well visualized. There is mild thickening of the aortic valve. Aortic valve regurgitation is not visualized. Mild aortic valve sclerosis is present, with no evidence of aortic valve stenosis. Pulmonic Valve: The pulmonic  valve was not well visualized. Pulmonic valve regurgitation is not visualized. No evidence of pulmonic stenosis. Aorta: The aortic root is normal in size and structure. Pulmonary Artery: The pulmonary artery is not well seen. Venous: The inferior vena cava is normal in size with less than 50% respiratory variability, suggesting right atrial pressure of 8 mmHg. IAS/Shunts: The interatrial septum was not well visualized. Nelva Bush MD Electronically signed by Nelva Bush MD Signature Date/Time: 03/06/2021/4:41:59 PM    Final      ASSESSMENT & PLAN:   Primary cancer of left upper lobe of lung ([place]) #Left upper lobe lung nodule-3.1 cm left apex-highly concerning for malignancy; along with borderline increase in size of the mediastinal lymph nodes. April 28th, 2022- PET scan-left upper lobe nodule suggestive of malignancy; equivocal right paratracheal lymph nodes-s/p SBRT [finished early June 2022]. SEP 29th,  2022- Substantially reduced activity in the left apical mass, with a new hypermetabolic left upper lobe peribronchovascular pulmonary nodule measuring 1.3 cm in diameter with maximum SUV of 6.9, compatible with malignancy.  #I reviewed my concerns for new left upper lobe lung nodule-as noted above on PET scan again concerning for malignancy.  Discussed option of SBRT.  Patient poor candidate for any systemic therapy.  I reviewed the natural history of progressive malignancy-need for symptomatic treatment/pain control.   # Patient states that she is "tired"; and is not interested in any seeking any further therapies.  She wants to cancel her appointment with Dr. Donella Stade.  I encouraged her to call the office to let them know of the patient's plan.  #History of Hodgkin's disease -active smoker/discussed smoking cessation especially with bleomycin-patient not interested quitting smoking.  #Advanced COPD- on O2 [place]-clinical stable however patient at high risk for decompensation.  Continue to monitor closely.  Given the incurable nature of the disease /poor tolerance of therapy and in general less than 6 months of life expectancy-I introduced hospice philosophy to the patient and family.  Discussed that goal of care should be directed to symptom management rather than treating the underlying disease; and in the process help improve quality of life rather than quantity.  Discussed with hospice team would include-nurse, nurse aide, social worker and chaplain for help take care of patient with physical/emotional needs. Patient currently palliative care at home.  Will discuss with Ralene Bathe patient's palliative care nurse practitioner regarding patient decisions/and patient will be transition to hospice when felt clinically appropriate.  # DISPOSITION: # follow up as needed-Dr.B  # 40 minutes face-to-face with the patient discussing the above plan of care; more than 50% of time spent on prognosis/ natural  history; counseling and coordination.  # I reviewed the blood work- with the patient in detail; also reviewed the imaging independently [as summarized above]; and with the patient in detail.    Cc; hayley  All questions were answered. The patient knows to call the clinic with any problems, questions or concerns.    Jennifer Sickle, MD 03/20/2021 4:19 PM

## 2021-03-22 ENCOUNTER — Other Ambulatory Visit: Payer: Medicare Other | Admitting: Primary Care

## 2021-03-22 ENCOUNTER — Other Ambulatory Visit: Payer: Self-pay

## 2021-03-22 DIAGNOSIS — R262 Difficulty in walking, not elsewhere classified: Secondary | ICD-10-CM

## 2021-03-22 DIAGNOSIS — Z87891 Personal history of nicotine dependence: Secondary | ICD-10-CM

## 2021-03-22 DIAGNOSIS — Z515 Encounter for palliative care: Secondary | ICD-10-CM

## 2021-03-22 DIAGNOSIS — Z9981 Dependence on supplemental oxygen: Secondary | ICD-10-CM

## 2021-03-22 DIAGNOSIS — C3412 Malignant neoplasm of upper lobe, left bronchus or lung: Secondary | ICD-10-CM

## 2021-03-22 NOTE — Progress Notes (Signed)
[facility] Palliative Care Consult Note Telephone: [phone]  Fax: [phone]    Date of encounter: 03/22/21 2:52 PM PATIENT NAME: [name] [place] [address]   [phone] (home)  DOB: 06/26/1945 MRN: [id number] PRIMARY CARE PROVIDER:    Baxter Hire, MD,  [place] [address] [phone]  REFERRING PROVIDER:   Baxter Hire, MD [place],  [place] [address] [phone]  RESPONSIBLE PARTY:    Contact Information     Name Relation Home Work Mobile   [name],[name] Daughter [phone]     [name] Daughter [phone]     [name],[name] Daughter   [phone]        I met face to face with patient and family in  home. Palliative Care was asked to follow this patient by consultation request of  Baxter Hire, MD to address advance care planning and complex medical decision making. This is a follow up visit.                                   ASSESSMENT AND PLAN / RECOMMENDATIONS:   Advance Care Planning/Goals of Care: Goals include to maximize quality of life and symptom management. Our advance care planning conversation included a discussion about:    The value and importance of advance care planning Experiences with loved ones who have been seriously ill or have died  Exploration of personal, cultural or spiritual beliefs that might influence medical decisions  Exploration of goals of care in the event of a sudden injury or illness  Identification and preparation of a healthcare agent: Patient unable to travel outside home, discussed SW aspect of Hospice and Financial planner of an  advance directive document . Decision not to resuscitate or to de-escalate disease focused treatments due to poor prognosis. CODE STATUS: DNR  Discussed EOL goals of care with patient and daughter. Patient shares her desire to be as comfortable as possible in her  own home for as long as she is able. Emphasized completion of the MOST form to assist family and providers with honoring patients wishes. MOST created with patient and daughter. Uploaded to [facility].   I spent 30 minutes providing this consultation. More than 50% of the time in this consultation was spent in counseling and care coordination.  _______________________________________________________________________________________________________________________________________   Symptom Management/Plan:  Falls: Ambulatory with the assistance of crutches. Daughter states patient will not use walking related to clutter in the home and difficulty navigating. Patient states crutches are easier to ambulate with at this time. Most recent fall was 03/07/2021 where patient fell trying to get inside her home after a hospital admission. History of post polio syndrome with left leg paralysis. Has had an additional prior fall with fracture to pubic symphysis. Patient denies severe injury at that time.   Nutrition: States she's gained weight at 126 lbs up from 117 lbs in 02/2021. She does not care for nutritional supplements. She is taking mirtazipine 7.5 mg po.  Oxygen: Has been using oxygen intermittently at 3 lpm, predominately at night. Smoking continues in the home . Oxygen safety teaching done. Daughter says pt takes off oxygen to smoke. Again, I reiterated the danger of anyone smoking with oxygen in use.   Dyspnea: Using trellogy, has nebs but not using nebs, and albuterol inhaler, Is able to speak at rest without a lot  of exertion. Complains of nasal dryness and productive cough with thick phlegm, recommended attaching provided humidification to O2 concentration device. Instructions given on how to apply humidity. Discussed importance of pulmonary hygiene and the added benefits of nebulizer treatment use.   Hospice: Discussed lung cancer progression and prognosis with patient and daughter. Received radiation  with return of new nodule upon repeat scan. Per oncology patient not eligible for further treatment. Patient and daughter verbalize clear understanding of prognosis. Explained hospice eligibility and the care and financial aspects of hospice services. Patients desire is to remain home and to have quality of life. Dicussed role of hospice in assisting with HCPOA.   Follow up Palliative Care Visit: Palliative care will continue to follow for complex medical decision making, advance care planning, and clarification of goals in the event patient is not deemed hospice appropriate.   This visit was coded based on medical decision making (MDM).  PPS: 40%  HOSPICE ELIGIBILITY/DIAGNOSIS: Primary cancer of left upper lobe of lung    Chief Complaint: Impaired Mobility   HISTORY OF PRESENT ILLNESS:  [name] is a 75 y.o. year old female  with advanced COPD, new left upper lung nodual-Patient poor candidate for any systemic therapy, current smoker, unintentional weight loss, immobility, and falls. Purpose of this visit is to discuss progressive malignancy and need for symptomatic treatment/pain control and introduction of hospice services. Patient states that she is "worn out"; and is not interested in any further therapies that aren't working.     History obtained from review of EMR, discussion with primary team, and interview with family, facility staff/caregiver and/or [name].  I reviewed available labs, medications, imaging, studies and related documents from the EMR.  Records reviewed and summarized above.  Marland Kitchen    ROS  General: NAD EYES: denies vision changes ENMT: + dysphagia, h/o strictures Cardiovascular: denies chest pain, + DOE Pulmonary: + productive cough, + increased SOB Abdomen: endorses good appetite, denies constipation, endorses continence of bowel GU: denies dysuria, endorses continence of urine MSK:  + weakness,  + falls reported (03/09/2021) Skin: denies rashes or  wounds Neurological: denies pain, denies insomnia Psych: Endorses positive mood Heme/lymph/immuno: denies bruises, abnormal bleeding  Physical Exam: Current and past weights: 126#; past weight 117# (9/28) Constitutional: NAD General: frail appearing EYES: anicteric sclera, lids intact, no discharge  ENMT: intact hearing, oral mucous membranes moist, dentition not intact CV: S1S2, RRR, no LE edema Pulmonary: LCTA, diminished with minimal air movement throughout all lobes, no increased work of breathing, no cough, oxygen at 3 L but not on currently, room air = 93% Abdomen: intake 30-50%,  no ascites MSK: moderate sarcopenia, moves all extremities, ambulatory with use of crutches Skin: warm and dry, no rashes or wounds on visible skin Neuro:  + generalized weakness,  no cognitive impairment Psych: anxious affect, A and O x 3 Hem/lymph/immuno: no widespread bruising   Thank you for the opportunity to participate in the care of [name].  The palliative care team will continue to follow. Please call our office at [phone] if we can be of additional assistance.   Jason Coop, NP   COVID-19 PATIENT SCREENING TOOL Asked and negative response unless otherwise noted:   Have you had symptoms of covid, tested positive or been in contact with someone with symptoms/positive test in the past 5-10 days?

## 2021-03-28 ENCOUNTER — Telehealth: Payer: Self-pay | Admitting: Internal Medicine

## 2021-03-28 NOTE — Telephone Encounter (Signed)
I spoke to pt's palliative care nurse practitioner Ms. Smith-regarding patient's overall poor prognosis/recommendation to proceed with hospice enrollment.  Ms.Smith will reach out to patient.  GB

## 2021-03-29 ENCOUNTER — Other Ambulatory Visit: Payer: Self-pay | Admitting: *Deleted

## 2021-03-29 MED ORDER — DIVALPROEX SODIUM 250 MG PO DR TAB: 250.0000 mg | DELAYED_RELEASE_TABLET | Freq: Two times a day (BID) | ORAL | 1 refills | Status: AC

## 2021-04-11 ENCOUNTER — Ambulatory Visit: Payer: Medicare Other | Admitting: Radiation Oncology

## 2021-06-06 ENCOUNTER — Other Ambulatory Visit: Payer: Self-pay | Admitting: Hospice and Palliative Medicine

## 2021-06-06 MED ORDER — HYDROCODONE-ACETAMINOPHEN 5-325 MG PO TABS: 1.0000 | ORAL_TABLET | Freq: Four times a day (QID) | ORAL | 0 refills | Status: DC | PRN

## 2021-06-06 NOTE — Progress Notes (Signed)
Received a call from hospice nurse, Phillips.  Reportedly, patient has some joint/shoulder pain unrelieved with Tylenol.  They are asking for something stronger for pain.  Will prescribe Norco.

## 2021-06-14 ENCOUNTER — Other Ambulatory Visit: Payer: Self-pay | Admitting: *Deleted

## 2021-06-14 ENCOUNTER — Other Ambulatory Visit: Payer: Self-pay

## 2021-06-14 DIAGNOSIS — R634 Abnormal weight loss: Secondary | ICD-10-CM

## 2021-06-14 NOTE — Telephone Encounter (Signed)
[name] with Hospice called reporting htat thre Norco 55/325 ordered 2 weeks ago is not controlling patient pain it only takes the edge off for maybe 4 hours, but does not relieve the pain. He is asking for a medicine change to something stronger. Please advise

## 2021-06-14 NOTE — Telephone Encounter (Signed)
Call returned to [facility] who agreed with doctor reccommendationand will notify patient daughter of changes and he did ask that her prescription be refilled with new directions

## 2021-06-15 MED ORDER — CLONAZEPAM 0.5 MG PO TABS: 0.5000 mg | ORAL_TABLET | Freq: Two times a day (BID) | ORAL | 1 refills | Status: AC | PRN

## 2021-06-15 MED ORDER — HYDROCODONE-ACETAMINOPHEN 5-325 MG PO TABS: 2.0000 | ORAL_TABLET | ORAL | 0 refills | Status: DC | PRN

## 2021-07-09 ENCOUNTER — Telehealth: Payer: Self-pay | Admitting: Hospice and Palliative Medicine

## 2021-07-09 NOTE — Telephone Encounter (Signed)
I received a call from hospice nurse, Amy.  Family feel the patient is having worse depression and requested increased dose of Paxil.  Looks like patient has been on Paxil 20 mg for several years.  Okay to increase to 30 mg (1.5 tablets) daily.  Can further increase to 40 mg/day if needed.

## 2021-07-16 ENCOUNTER — Other Ambulatory Visit: Payer: Self-pay | Admitting: Hospice and Palliative Medicine

## 2021-07-16 MED ORDER — HYDROCODONE-ACETAMINOPHEN 5-325 MG PO TABS: 2.0000 | ORAL_TABLET | ORAL | 0 refills | Status: DC | PRN

## 2021-07-16 MED ORDER — PAROXETINE HCL 20 MG PO TABS: 30.0000 mg | ORAL_TABLET | Freq: Every evening | ORAL | 2 refills | Status: AC

## 2021-07-16 NOTE — Progress Notes (Signed)
Received a call from hospice nurse, Amy.  We will send refill for Norco and Paxil.

## 2021-07-31 ENCOUNTER — Telehealth: Payer: Self-pay | Admitting: Hospice and Palliative Medicine

## 2021-07-31 MED ORDER — OXYCODONE HCL 5 MG PO TABS: 5.0000 mg | ORAL_TABLET | ORAL | 0 refills | Status: DC | PRN

## 2021-07-31 NOTE — Telephone Encounter (Signed)
Received a call from hospice nurse, Amy.  Patient has poorly controlled generalized pain despite Norco 2 tablets 3 times daily.  She finds very little effect from the Norco.  We will rotate to oxycodone and liberalize frequency.

## 2021-08-17 ENCOUNTER — Other Ambulatory Visit: Payer: Self-pay | Admitting: Hospice and Palliative Medicine

## 2021-08-17 MED ORDER — OXYCODONE HCL 5 MG PO TABS: 5.0000 mg | ORAL_TABLET | ORAL | 0 refills | Status: AC | PRN

## 2021-08-20 ENCOUNTER — Other Ambulatory Visit: Payer: Self-pay | Admitting: Hospice and Palliative Medicine

## 2021-08-20 MED ORDER — MORPHINE SULFATE (CONCENTRATE) 10 MG /0.5 ML PO SOLN: 5.0000 mg | ORAL | 0 refills | Status: AC | PRN

## 2021-08-20 NOTE — Progress Notes (Signed)
Hospice nurse, Amy, called reports that patient is actively terminal.  She is apparently short of breath and hospice would like to initiate morphine elixir.  We will send Rx to pharmacy ?

## 2021-09-15 DEATH — deceased

## 2022-05-29 IMAGING — DX DG CHEST 1V PORT
1 series · 1 of 1 positions shown · non-contrast
Comparison: CT chest 09/28/2020

CLINICAL DATA: Cough, possible aspiration

EXAM:
PORTABLE CHEST 1 VIEW

[chest ap]
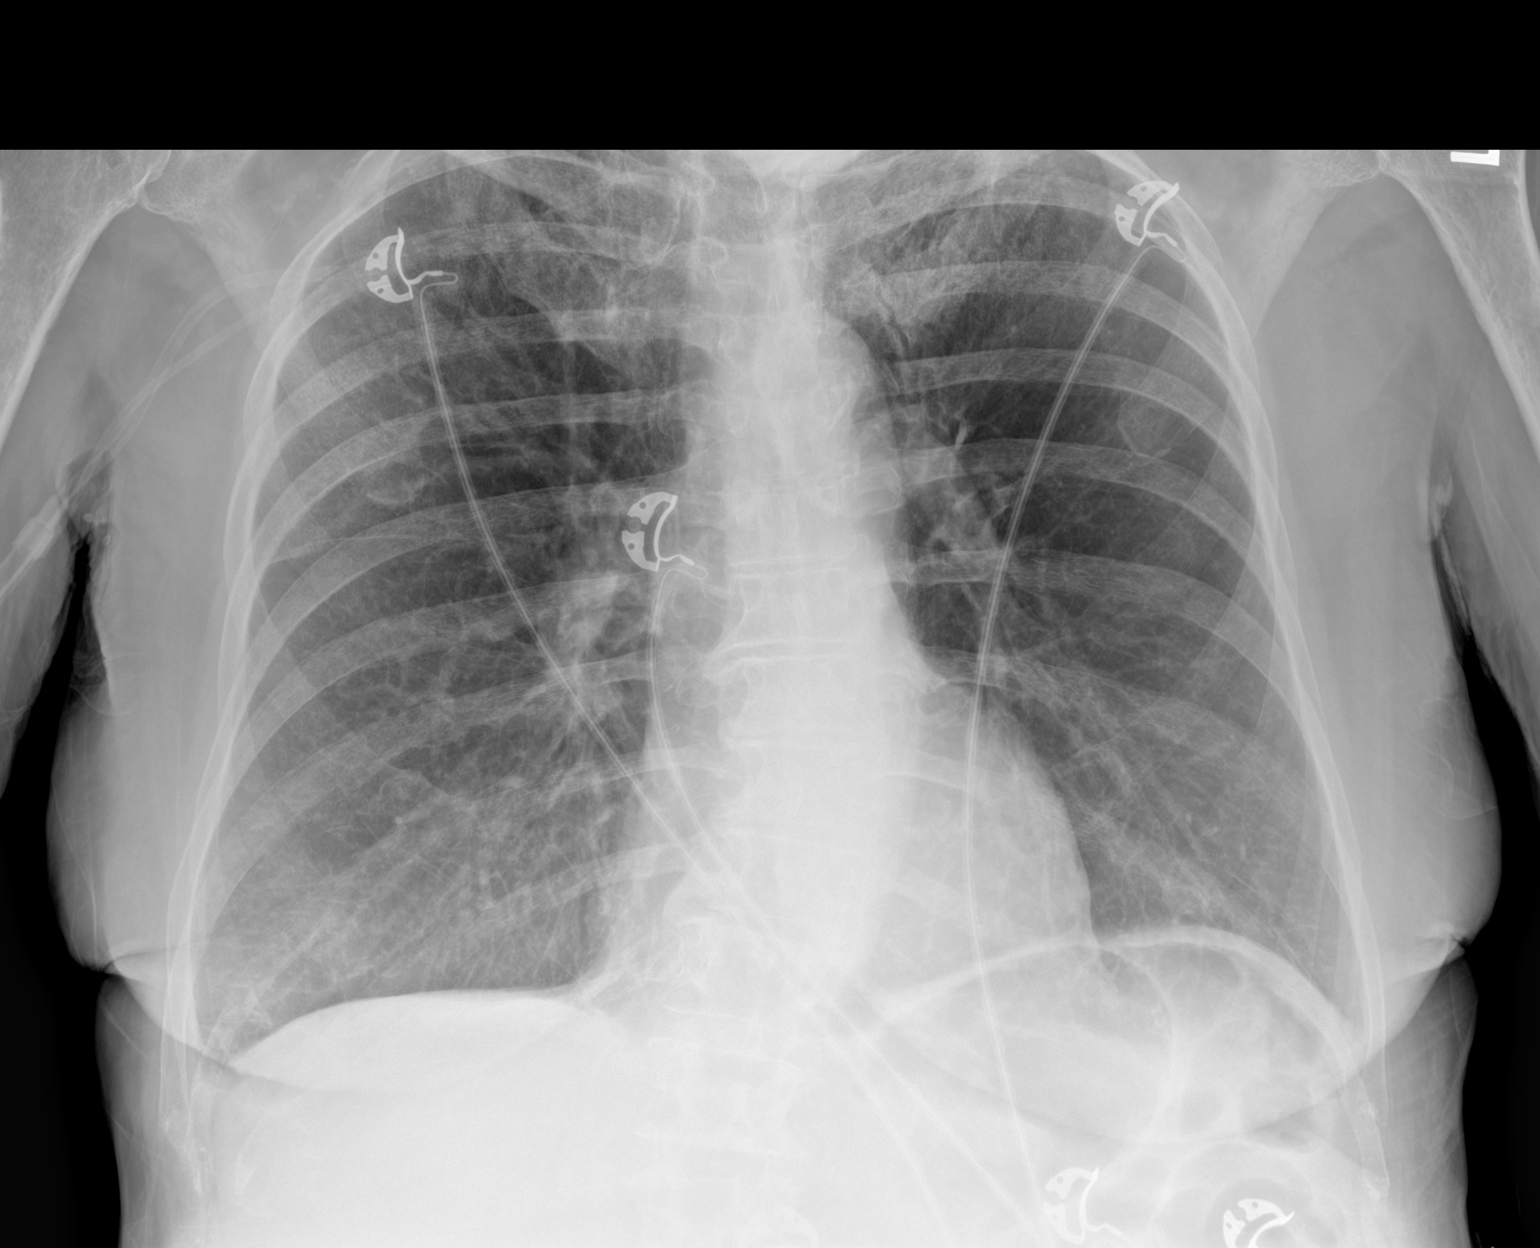

[1 of 1 positions shown; findings below may reference images not displayed]

FINDINGS: The heart size and mediastinal contours are within normal limits. A
nodule of the left pulmonary apex is not clearly appreciated on this
examination, with some irregular opacity in this vicinity. The
visualized skeletal structures are unremarkable.
IMPRESSION: 1.  No acute abnormality of the lungs.

2. A nodule of the left pulmonary apex is not clearly appreciated on
this examination, with some irregular opacity in this vicinity. This
may reflect interval treatment response of known malignancy and
could be better evaluated by staging CT on a nonemergent basis.
